# Patient Record
Sex: Male | Born: 1950 | Race: Black or African American | Hispanic: No | Marital: Married | Smoking: Former smoker
Health system: Southern US, Community
[De-identification: ages and names within clinical notes are randomized; demographics above are authoritative.]

## PROBLEM LIST (undated history)

## (undated) DIAGNOSIS — I10 Essential (primary) hypertension: Secondary | ICD-10-CM

## (undated) DIAGNOSIS — I2510 Atherosclerotic heart disease of native coronary artery without angina pectoris: Secondary | ICD-10-CM

## (undated) DIAGNOSIS — J449 Chronic obstructive pulmonary disease, unspecified: Secondary | ICD-10-CM

## (undated) DIAGNOSIS — M1990 Unspecified osteoarthritis, unspecified site: Secondary | ICD-10-CM

## (undated) DIAGNOSIS — I739 Peripheral vascular disease, unspecified: Secondary | ICD-10-CM

## (undated) DIAGNOSIS — F410 Panic disorder [episodic paroxysmal anxiety] without agoraphobia: Secondary | ICD-10-CM

## (undated) DIAGNOSIS — F419 Anxiety disorder, unspecified: Secondary | ICD-10-CM

## (undated) DIAGNOSIS — K219 Gastro-esophageal reflux disease without esophagitis: Secondary | ICD-10-CM

## (undated) HISTORY — PX: CATARACT EXTRACTION W/ INTRAOCULAR LENS IMPLANT: SHX1309

## (undated) HISTORY — DX: Atherosclerotic heart disease of native coronary artery without angina pectoris: I25.10

## (undated) HISTORY — PX: NO PAST SURGERIES: SHX2092

---

## 2007-09-27 DIAGNOSIS — R221 Localized swelling, mass and lump, neck: Secondary | ICD-10-CM | POA: Insufficient documentation

## 2007-10-07 ENCOUNTER — Ambulatory Visit: Payer: Self-pay | Admitting: Otolaryngology

## 2010-04-17 DIAGNOSIS — M25519 Pain in unspecified shoulder: Secondary | ICD-10-CM | POA: Insufficient documentation

## 2017-06-09 DIAGNOSIS — Z720 Tobacco use: Secondary | ICD-10-CM | POA: Insufficient documentation

## 2017-06-09 DIAGNOSIS — B070 Plantar wart: Secondary | ICD-10-CM | POA: Insufficient documentation

## 2017-06-23 ENCOUNTER — Ambulatory Visit (INDEPENDENT_AMBULATORY_CARE_PROVIDER_SITE_OTHER): Payer: Medicare Other | Admitting: Podiatry

## 2017-06-23 DIAGNOSIS — M7751 Other enthesopathy of right foot: Secondary | ICD-10-CM

## 2017-06-23 DIAGNOSIS — L84 Corns and callosities: Secondary | ICD-10-CM | POA: Diagnosis not present

## 2017-06-23 DIAGNOSIS — M779 Enthesopathy, unspecified: Secondary | ICD-10-CM

## 2017-06-25 NOTE — Progress Notes (Signed)
   Subjective: 67 year old male presenting today as a new patient with a chief complaint of a painful callus lesion noted to the right sub-fifth MPJ that appeared over 3 months ago. Walking and bearing weight increases the pain. He has not done anything for treatment. Patient is here for further evaluation and treatment.   No past medical history on file.   Objective:  Physical Exam General: Alert and oriented x3 in no acute distress  Dermatology: Hyperkeratotic lesion present on the right sub-fifth MPJ. Pain on palpation with a central nucleated core noted. Skin is warm, dry and supple bilateral lower extremities. Negative for open lesions or macerations.  Vascular: Palpable pedal pulses bilaterally. No edema or erythema noted. Capillary refill within normal limits.  Neurological: Epicritic and protective threshold grossly intact bilaterally.   Musculoskeletal Exam: Pain on palpation at the keratotic lesion noted and to the 5th MPJ of the right foot. Range of motion within normal limits bilateral. Muscle strength 5/5 in all groups bilateral.  Assessment: 1. 5th MPJ capsulitis right 2. Pre-ulcerative callus lesion noted to the sub-fifth MPJ right    Plan of Care:  1. Patient evaluated 2. Excisional debridement of keratoic lesion using a chisel blade was performed without incident.  3. Dressed area with light dressing. 4. Injection of 0.5 mLs Celestone Soluspan injected into the right 5th MPJ.  5. Recommended good shoe gear.  6. Patient is to return to the clinic PRN.   Works maintenance at a golf course.   Edrick Kins, DPM [facility]  Dr. Edrick Kins, [address]                                        [place], [place] [address]                Office [phone]  Fax [phone]

## 2018-01-19 DIAGNOSIS — J45909 Unspecified asthma, uncomplicated: Secondary | ICD-10-CM | POA: Insufficient documentation

## 2018-01-25 ENCOUNTER — Telehealth: Payer: Self-pay | Admitting: *Deleted

## 2018-01-25 DIAGNOSIS — Z122 Encounter for screening for malignant neoplasm of respiratory organs: Secondary | ICD-10-CM

## 2018-01-25 DIAGNOSIS — Z87891 Personal history of nicotine dependence: Secondary | ICD-10-CM

## 2018-01-25 NOTE — Telephone Encounter (Signed)
Received referral for initial lung cancer screening scan. Contacted patient and obtained smoking history,(current, 47 pack year) as well as answering questions related to screening process. Patient denies signs of lung cancer such as weight loss or hemoptysis. Patient denies comorbidity that would prevent curative treatment if lung cancer were found. Patient is scheduled for shared decision making visit and CT scan on 02/18/18 at 145pm.

## 2018-01-25 NOTE — Telephone Encounter (Signed)
Received referral for low dose lung cancer screening CT scan. Message left at phone number listed in EMR for patient to call me back to facilitate scheduling scan.  

## 2018-02-15 ENCOUNTER — Telehealth: Payer: Self-pay | Admitting: *Deleted

## 2018-02-15 NOTE — Telephone Encounter (Signed)
Called patient to inform them of their appt for LDCT screening on Thursday 02-18-18@1345 , message left for patient.

## 2018-02-18 ENCOUNTER — Inpatient Hospital Stay: Payer: Medicare Other | Attending: Nurse Practitioner | Admitting: Nurse Practitioner

## 2018-02-18 ENCOUNTER — Encounter: Payer: Self-pay | Admitting: Nurse Practitioner

## 2018-02-18 ENCOUNTER — Ambulatory Visit: Payer: Medicare Other | Attending: Nurse Practitioner

## 2018-02-22 ENCOUNTER — Telehealth: Payer: Self-pay | Admitting: *Deleted

## 2018-02-22 NOTE — Telephone Encounter (Signed)
Patient is agreeable to being scheduled for lung screening 03/02/18 at 130pm.

## 2018-03-01 ENCOUNTER — Telehealth: Payer: Self-pay | Admitting: *Deleted

## 2018-03-01 NOTE — Telephone Encounter (Signed)
Called pt to remind them of appt for ldct screening on 03-02-2018 at 1330, message left for patient.

## 2018-03-02 ENCOUNTER — Inpatient Hospital Stay: Payer: Medicare Other | Attending: Oncology | Admitting: Nurse Practitioner

## 2018-03-02 ENCOUNTER — Encounter (INDEPENDENT_AMBULATORY_CARE_PROVIDER_SITE_OTHER): Payer: Self-pay

## 2018-03-02 ENCOUNTER — Encounter: Payer: Self-pay | Admitting: *Deleted

## 2018-03-02 ENCOUNTER — Ambulatory Visit
Admission: RE | Admit: 2018-03-02 | Discharge: 2018-03-02 | Disposition: A | Discharge status: Home / Self Care | Payer: Medicare Other | Source: Ambulatory Visit | Attending: Nurse Practitioner | Admitting: Nurse Practitioner

## 2018-03-02 DIAGNOSIS — Z720 Tobacco use: Secondary | ICD-10-CM

## 2018-03-02 DIAGNOSIS — Z122 Encounter for screening for malignant neoplasm of respiratory organs: Secondary | ICD-10-CM | POA: Diagnosis present

## 2018-03-02 DIAGNOSIS — Z87891 Personal history of nicotine dependence: Secondary | ICD-10-CM | POA: Diagnosis present

## 2018-03-02 DIAGNOSIS — F17210 Nicotine dependence, cigarettes, uncomplicated: Secondary | ICD-10-CM

## 2018-03-02 NOTE — Progress Notes (Signed)
In accordance with CMS guidelines, patient has met eligibility criteria including age, absence of signs or symptoms of lung cancer.  Social History   Tobacco Use  . Smoking status: Current Every Day Smoker    Packs/day: 1.00    Years: 47.00    Pack years: 47.00    Types: Cigarettes  Substance Use Topics  . Alcohol use: Not on file  . Drug use: Not on file      A shared decision-making session was conducted prior to the performance of CT scan. This includes one or more decision aids, includes benefits and harms of screening, follow-up diagnostic testing, over-diagnosis, false positive rate, and total radiation exposure.   Counseling on the importance of adherence to annual lung cancer LDCT screening, impact of co-morbidities, and ability or willingness to undergo diagnosis and treatment is imperative for compliance of the program.   Counseling on the importance of continued smoking cessation for former smokers; the importance of smoking cessation for current smokers, and information about tobacco cessation interventions have been given to patient including [place] and 1800 quit [place] programs.   Written order for lung cancer screening with LDCT has been given to the patient and any and all questions have been answered to the best of my abilities.    Yearly follow up will be coordinated by Burgess Estelle, Thoracic Navigator.  Beckey Rutter, DNP, AGNP-C [place] at [facility] [phone] (work cell) [phone] (office) 03/02/18 2:35 PM

## 2018-10-22 DIAGNOSIS — M779 Enthesopathy, unspecified: Secondary | ICD-10-CM | POA: Insufficient documentation

## 2019-03-02 ENCOUNTER — Telehealth: Payer: Self-pay | Admitting: *Deleted

## 2019-03-02 DIAGNOSIS — Z87891 Personal history of nicotine dependence: Secondary | ICD-10-CM

## 2019-03-02 NOTE — Telephone Encounter (Signed)
Patient has been notified that annual lung cancer screening low dose CT scan is due currently or will be in near future. Confirmed that patient is within the age range of 55-77, and asymptomatic, (no signs or symptoms of lung cancer). Patient denies illness that would prevent curative treatment for lung cancer if found. Verified smoking history, (current, 48 pack year). The shared decision making visit was done 03/02/18. Patient is agreeable for CT scan being scheduled.

## 2019-03-09 ENCOUNTER — Other Ambulatory Visit: Payer: Self-pay

## 2019-03-09 ENCOUNTER — Ambulatory Visit
Admission: RE | Admit: 2019-03-09 | Discharge: 2019-03-09 | Disposition: A | Discharge status: Home / Self Care | Payer: Medicare Other | Source: Ambulatory Visit | Attending: Oncology | Admitting: Oncology

## 2019-03-09 DIAGNOSIS — Z87891 Personal history of nicotine dependence: Secondary | ICD-10-CM | POA: Insufficient documentation

## 2019-03-11 ENCOUNTER — Encounter: Payer: Self-pay | Admitting: *Deleted

## 2019-05-31 DIAGNOSIS — Z13220 Encounter for screening for lipoid disorders: Secondary | ICD-10-CM | POA: Insufficient documentation

## 2019-05-31 DIAGNOSIS — Z1211 Encounter for screening for malignant neoplasm of colon: Secondary | ICD-10-CM | POA: Insufficient documentation

## 2019-05-31 DIAGNOSIS — R1310 Dysphagia, unspecified: Secondary | ICD-10-CM | POA: Insufficient documentation

## 2020-03-01 ENCOUNTER — Other Ambulatory Visit: Payer: Self-pay | Admitting: *Deleted

## 2020-03-01 DIAGNOSIS — Z87891 Personal history of nicotine dependence: Secondary | ICD-10-CM

## 2020-03-01 DIAGNOSIS — Z122 Encounter for screening for malignant neoplasm of respiratory organs: Secondary | ICD-10-CM

## 2020-03-01 NOTE — Progress Notes (Signed)
Contacted and scheduled for annual lung screening scan. Patient is a current smoker with a 49 pack year history.  

## 2020-03-15 ENCOUNTER — Ambulatory Visit
Admission: RE | Admit: 2020-03-15 | Discharge: 2020-03-15 | Disposition: A | Discharge status: Home / Self Care | Payer: Medicare Other | Source: Ambulatory Visit | Attending: Nurse Practitioner | Admitting: Nurse Practitioner

## 2020-03-15 ENCOUNTER — Other Ambulatory Visit: Payer: Self-pay

## 2020-03-15 DIAGNOSIS — Z87891 Personal history of nicotine dependence: Secondary | ICD-10-CM | POA: Insufficient documentation

## 2020-03-15 DIAGNOSIS — I2510 Atherosclerotic heart disease of native coronary artery without angina pectoris: Secondary | ICD-10-CM | POA: Insufficient documentation

## 2020-03-15 DIAGNOSIS — Z122 Encounter for screening for malignant neoplasm of respiratory organs: Secondary | ICD-10-CM | POA: Insufficient documentation

## 2020-03-19 ENCOUNTER — Other Ambulatory Visit: Payer: Self-pay

## 2020-03-19 ENCOUNTER — Ambulatory Visit (INDEPENDENT_AMBULATORY_CARE_PROVIDER_SITE_OTHER): Payer: Medicare Other | Admitting: Cardiology

## 2020-03-19 ENCOUNTER — Encounter: Payer: Self-pay | Admitting: Cardiology

## 2020-03-19 ENCOUNTER — Encounter: Payer: Self-pay | Admitting: *Deleted

## 2020-03-19 VITALS — BP 140/70 | HR 94 | Ht 75.0 in | Wt 223.0 lb

## 2020-03-19 DIAGNOSIS — F17200 Nicotine dependence, unspecified, uncomplicated: Secondary | ICD-10-CM | POA: Diagnosis not present

## 2020-03-19 DIAGNOSIS — I2510 Atherosclerotic heart disease of native coronary artery without angina pectoris: Secondary | ICD-10-CM | POA: Diagnosis not present

## 2020-03-19 MED ORDER — ASPIRIN EC 81 MG PO TBEC: 81.0000 mg | DELAYED_RELEASE_TABLET | Freq: Every day | ORAL | 3 refills | Status: DC

## 2020-03-19 NOTE — Progress Notes (Signed)
Cardiology Office Note:    Date:  03/19/2020   ID:  Blake Padilla, DOB [birth year], MRN [id number]  PCP:  Marguerita Merles, MD  [facility] Cardiologist:  Kate Sable, MD  [place] Electrophysiologist:  None   Referring MD: Marguerita Merles, MD   Chief Complaint  Patient presents with  . NEW patient-Referred by Dr. MIles for CAD    History of Present Illness:    Blake Padilla is a 70 y.o. male with a hx of CAD, current smoker x50+ years, emphysema who presents due to coronary artery calcifications.  Patient gets chest CT lung cancer screening yearly due to smoking history.  Last chest CT noted to have three-vessel coronary artery calcifications.  He denies symptoms of chest pain or shortness of breath, he works in a golf course, which involves a lot of walking, and playing some golf.  Has no symptoms at rest or with exertion.  Denies taking any medicines, denies any family history of heart disease.  History reviewed. No pertinent past medical history.  History reviewed. No pertinent surgical history.  Current Medications: Current Meds  Medication Sig  . aspirin EC 81 MG tablet Take 1 tablet (81 mg total) by mouth daily. Swallow whole.     Allergies:   Patient has no known allergies.   Social History   Socioeconomic History  . Marital status: Married    Spouse name: Not on file  . Number of children: Not on file  . Years of education: Not on file  . Highest education level: Not on file  Occupational History  . Not on file  Tobacco Use  . Smoking status: Current Every Day Smoker    Packs/day: 1.00    Years: 47.00    Pack years: 47.00    Types: Cigarettes  . Smokeless tobacco: Never Used  Vaping Use  . Vaping Use: Never used  Substance and Sexual Activity  . Alcohol use: Not Currently    Comment: with colds  . Drug use: Never  . Sexual activity: Not on file  Other Topics Concern  . Not on file  Social History Narrative  . Not on file    Social Determinants of Health   Financial Resource Strain: Not on file  Food Insecurity: Not on file  Transportation Needs: Not on file  Physical Activity: Not on file  Stress: Not on file  Social Connections: Not on file     Family History: The patient's family history includes COPD in his sister; Dementia in his sister; Diabetes in his brother, father, and mother; Healthy in his sister; Hypertension in his father and mother; Leukemia in his sister.  ROS:   Please see the history of present illness.     All other systems reviewed and are negative.  EKGs/Labs/Other Studies Reviewed:    The following studies were reviewed today:   EKG:  EKG is  ordered today.  The ekg ordered today demonstrates sinus rhythm, PACs  Recent Labs: No results found for requested labs within last 8760 hours.  Recent Lipid Panel No results found for: CHOL, TRIG, HDL, CHOLHDL, VLDL, LDLCALC, LDLDIRECT   Risk Assessment/Calculations:      Physical Exam:    VS:  BP 140/70 (BP Location: Right Arm, Patient Position: Sitting, Cuff Size: Normal)   Pulse 94   Ht 6\' 3"  (1.905 m)   Wt 223 lb (101.2 kg)   SpO2 97%   BMI 27.87 kg/m     Wt Readings from  Last 3 Encounters:  03/19/20 223 lb (101.2 kg)  03/15/20 215 lb (97.5 kg)  03/09/19 226 lb (102.5 kg)     GEN:  Well nourished, well developed in no acute distress HEENT: Normal NECK: No JVD; No carotid bruits LYMPHATICS: No lymphadenopathy CARDIAC: RRR, no murmurs, rubs, gallops RESPIRATORY: Decreased breath sounds, otherwise clear  rhonchi  ABDOMEN: Soft, non-tender, non-distended MUSCULOSKELETAL:  No edema; No deformity  SKIN: Warm and dry NEUROLOGIC:  Alert and oriented x 3 PSYCHIATRIC:  Normal affect   ASSESSMENT:    1. Coronary artery disease involving native coronary artery of native heart without angina pectoris   2. Smoking    PLAN:    In order of problems listed above:  1. Three-vessel coronary artery calcifications.   Currently asymptomatic.  Start aspirin 81 mg daily, get fasting lipid profile.  Get echocardiogram.  LDL goal less than 70.  He will need some form of statin therapy will determine intensity level based on lipid panel results. 2. Patient is a current smoker, cessation advised.  Follow-up after echocardiogram.     Medication Adjustments/Labs and Tests Ordered: Current medicines are reviewed at length with the patient today.  Concerns regarding medicines are outlined above.  Orders Placed This Encounter  Procedures  . Lipid panel  . EKG 12-Lead  . ECHOCARDIOGRAM COMPLETE   Meds ordered this encounter  Medications  . aspirin EC 81 MG tablet    Sig: Take 1 tablet (81 mg total) by mouth daily. Swallow whole.    Dispense:  90 tablet    Refill:  3    Patient Instructions  Medication Instructions:   Your physician has recommended you make the following change in your medication:   1.  START taking Aspirin 81 MG: Take 1 tablet by mouth daily.  *If you need a refill on your cardiac medications before your next appointment, please call your pharmacy*   Lab Work:  Your physician recommends that you have a FASTING lipid profile drawn today.  If you have labs (blood work) drawn today and your tests are completely normal, you will receive your results only by: Marland Kitchen MyChart Message (if you have MyChart) OR . A paper copy in the mail If you have any lab test that is abnormal or we need to change your treatment, we will call you to review the results.   Testing/Procedures:  Your physician has requested that you have an echocardiogram. Echocardiography is a painless test that uses sound waves to create images of your heart. It provides your doctor with information about the size and shape of your heart and how well your heart's chambers and valves are working. This procedure takes approximately one hour. There are no restrictions for this procedure.    Follow-Up: At [facility], you  and your health needs are our priority.  As part of our continuing mission to provide you with exceptional heart care, we have created designated Provider Care Teams.  These Care Teams include your primary Cardiologist (physician) and Advanced Practice Providers (APPs -  Physician Assistants and Nurse Practitioners) who all work together to provide you with the care you need, when you need it.  We recommend signing up for the patient portal called "MyChart".  Sign up information is provided on this After Visit Summary.  MyChart is used to connect with patients for Virtual Visits (Telemedicine).  Patients are able to view lab/test results, encounter notes, upcoming appointments, etc.  Non-urgent messages can be sent to your provider as  well.   To learn more about what you can do with MyChart, go to [email or web address].    Your next appointment:   Follow up after Echo   The format for your next appointment:   In Person  Provider:   Kate Sable, MD   Other Instructions      Signed, Kate Sable, MD  03/19/2020 1:11 PM    [place]

## 2020-03-19 NOTE — Patient Instructions (Signed)
Medication Instructions:   Your physician has recommended you make the following change in your medication:   1.  START taking Aspirin 81 MG: Take 1 tablet by mouth daily.  *If you need a refill on your cardiac medications before your next appointment, please call your pharmacy*   Lab Work:  Your physician recommends that you have a FASTING lipid profile drawn today.  If you have labs (blood work) drawn today and your tests are completely normal, you will receive your results only by: Marland Kitchen MyChart Message (if you have MyChart) OR . A paper copy in the mail If you have any lab test that is abnormal or we need to change your treatment, we will call you to review the results.   Testing/Procedures:  Your physician has requested that you have an echocardiogram. Echocardiography is a painless test that uses sound waves to create images of your heart. It provides your doctor with information about the size and shape of your heart and how well your heart's chambers and valves are working. This procedure takes approximately one hour. There are no restrictions for this procedure.    Follow-Up: At [facility], you and your health needs are our priority.  As part of our continuing mission to provide you with exceptional heart care, we have created designated Provider Care Teams.  These Care Teams include your primary Cardiologist (physician) and Advanced Practice Providers (APPs -  Physician Assistants and Nurse Practitioners) who all work together to provide you with the care you need, when you need it.  We recommend signing up for the patient portal called "MyChart".  Sign up information is provided on this After Visit Summary.  MyChart is used to connect with patients for Virtual Visits (Telemedicine).  Patients are able to view lab/test results, encounter notes, upcoming appointments, etc.  Non-urgent messages can be sent to your provider as well.   To learn more about what you can do with  MyChart, go to [email or web address].    Your next appointment:   Follow up after Echo   The format for your next appointment:   In Person  Provider:   Kate Sable, MD   Other Instructions

## 2020-03-20 ENCOUNTER — Telehealth: Payer: Self-pay

## 2020-03-20 LAB — LIPID PANEL
Chol/HDL Ratio: 4.4 ratio (ref 0.0–5.0)
Cholesterol, Total: 186 mg/dL (ref 100–199)
HDL: 42 mg/dL (ref >39)
LDL Chol Calc (NIH): 127 mg/dL — ABNORMAL HIGH (ref 0–99)
Triglycerides: 90 mg/dL (ref 0–149)
VLDL Cholesterol Cal: 17 mg/dL (ref 5–40)

## 2020-03-20 MED ORDER — ATORVASTATIN CALCIUM 40 MG PO TABS: 40.0000 mg | ORAL_TABLET | Freq: Every day | ORAL | 5 refills | Status: DC

## 2020-03-20 NOTE — Telephone Encounter (Signed)
Spoke with patient and gave him the following recommendations:  Kate Sable, MD  Kavin Leech, RN LDL/bad cholesterol not at goal/elevated. Please start Lipitor 40 mg daily. Keep follow-up appointment with myself. Thank you   Patient verbalized understanding and agreed with plan. He also confirmed that he will be here for his echo and his follow up appointment.

## 2020-03-29 ENCOUNTER — Ambulatory Visit (INDEPENDENT_AMBULATORY_CARE_PROVIDER_SITE_OTHER): Payer: Medicare Other

## 2020-03-29 ENCOUNTER — Other Ambulatory Visit: Payer: Self-pay

## 2020-03-29 DIAGNOSIS — I2510 Atherosclerotic heart disease of native coronary artery without angina pectoris: Secondary | ICD-10-CM

## 2020-03-29 LAB — ECHOCARDIOGRAM COMPLETE
AR max vel: 3.06 cm2
AV Area VTI: 2.69 cm2
AV Area mean vel: 2.72 cm2
AV Mean grad: 5 mmHg
AV Peak grad: 8.4 mmHg
Ao pk vel: 1.45 m/s
Area-P 1/2: 3.61 cm2
S' Lateral: 3.5 cm
Single Plane A4C EF: 52.9 %

## 2020-04-02 ENCOUNTER — Other Ambulatory Visit: Payer: Self-pay

## 2020-04-02 ENCOUNTER — Ambulatory Visit (INDEPENDENT_AMBULATORY_CARE_PROVIDER_SITE_OTHER): Payer: Medicare Other | Admitting: Cardiology

## 2020-04-02 ENCOUNTER — Encounter: Payer: Self-pay | Admitting: Cardiology

## 2020-04-02 VITALS — BP 102/80 | HR 97 | Ht 75.0 in | Wt 223.0 lb

## 2020-04-02 DIAGNOSIS — I2510 Atherosclerotic heart disease of native coronary artery without angina pectoris: Secondary | ICD-10-CM | POA: Diagnosis not present

## 2020-04-02 DIAGNOSIS — E785 Hyperlipidemia, unspecified: Secondary | ICD-10-CM | POA: Diagnosis not present

## 2020-04-02 DIAGNOSIS — F17200 Nicotine dependence, unspecified, uncomplicated: Secondary | ICD-10-CM

## 2020-04-02 NOTE — Patient Instructions (Signed)
Medication Instructions:  Your physician recommends that you continue on your current medications as directed. Please refer to the Current Medication list given to you today.  *If you need a refill on your cardiac medications before your next appointment, please call your pharmacy*   Lab Work:  Your physician recommends that you return for a FASTING lipid profile: just prior to your follow up appointment.  - You will need to be fasting. Please do not have anything to eat or drink after midnight the morning you have the lab work. You may only have water or black coffee with no cream or sugar. - Please go to the [facility]. You will check in at the front desk to the right as you walk into the atrium. Valet Parking is offered if needed. - No appointment needed. You may go any day between 7 am and 6 pm.   Testing/Procedures: None ordered   Follow-Up: At [facility], you and your health needs are our priority.  As part of our continuing mission to provide you with exceptional heart care, we have created designated Provider Care Teams.  These Care Teams include your primary Cardiologist (physician) and Advanced Practice Providers (APPs -  Physician Assistants and Nurse Practitioners) who all work together to provide you with the care you need, when you need it.  We recommend signing up for the patient portal called "MyChart".  Sign up information is provided on this After Visit Summary.  MyChart is used to connect with patients for Virtual Visits (Telemedicine).  Patients are able to view lab/test results, encounter notes, upcoming appointments, etc.  Non-urgent messages can be sent to your provider as well.   To learn more about what you can do with MyChart, go to [email or web address].    Your next appointment:   6 month(s)  The format for your next appointment:   In Person  Provider:   Kate Sable, MD   Other Instructions

## 2020-04-02 NOTE — Progress Notes (Signed)
Cardiology Office Note:    Date:  04/02/2020   ID:  Blake Padilla, DOB 07/29/1950, MRN [id number]  PCP:  Marguerita Merles, MD  [facility] Cardiologist:  Kate Sable, MD  [place] Electrophysiologist:  None   Referring MD: Marguerita Merles, MD   Chief Complaint  Patient presents with  . Follow-up    Follow up post ECHO - Meds reviewed verbally with patient.     History of Present Illness:    Blake Padilla is a 70 y.o. male with a hx of CAD, current smoker x50+ years, emphysema who presents for follow-up.  He was last seen due to coronary artery calcifications noted on noncontrast chest CT for lung cancer screening.  He denies chest pain, shortness of breath.  He still smokes.  Echocardiogram was ordered to evaluate cardiac function, lipid panel also obtain for risk stratification.  He feels well, has no concerns at this time.   History reviewed. No pertinent past medical history.  History reviewed. No pertinent surgical history.  Current Medications: Current Meds  Medication Sig  . aspirin EC 81 MG tablet Take 1 tablet (81 mg total) by mouth daily. Swallow whole.  Marland Kitchen atorvastatin (LIPITOR) 40 MG tablet Take 1 tablet (40 mg total) by mouth daily.     Allergies:   Patient has no known allergies.   Social History   Socioeconomic History  . Marital status: Married    Spouse name: Not on file  . Number of children: Not on file  . Years of education: Not on file  . Highest education level: Not on file  Occupational History  . Not on file  Tobacco Use  . Smoking status: Current Every Day Smoker    Packs/day: 1.00    Years: 47.00    Pack years: 47.00    Types: Cigarettes  . Smokeless tobacco: Never Used  Vaping Use  . Vaping Use: Never used  Substance and Sexual Activity  . Alcohol use: Not Currently    Comment: with colds  . Drug use: Never  . Sexual activity: Not on file  Other Topics Concern  . Not on file  Social History Narrative  . Not  on file   Social Determinants of Health   Financial Resource Strain: Not on file  Food Insecurity: Not on file  Transportation Needs: Not on file  Physical Activity: Not on file  Stress: Not on file  Social Connections: Not on file     Family History: The patient's family history includes COPD in his sister; Dementia in his sister; Diabetes in his brother, father, and mother; Healthy in his sister; Hypertension in his father and mother; Leukemia in his sister.  ROS:   Please see the history of present illness.     All other systems reviewed and are negative.  EKGs/Labs/Other Studies Reviewed:    The following studies were reviewed today:   EKG:  EKG not  ordered today.   Recent Labs: No results found for requested labs within last 8760 hours.  Recent Lipid Panel    Component Value Date/Time   CHOL 186 03/19/2020 0841   TRIG 90 03/19/2020 0841   HDL 42 03/19/2020 0841   CHOLHDL 4.4 03/19/2020 0841   LDLCALC 127 (H) 03/19/2020 0841     Risk Assessment/Calculations:      Physical Exam:    VS:  BP 102/80 (BP Location: Left Arm, Patient Position: Sitting, Cuff Size: Normal)   Pulse 97   Ht 6'  3" (1.905 m)   Wt 223 lb (101.2 kg)   SpO2 94%   BMI 27.87 kg/m     Wt Readings from Last 3 Encounters:  04/02/20 223 lb (101.2 kg)  03/19/20 223 lb (101.2 kg)  03/15/20 215 lb (97.5 kg)     GEN:  Well nourished, well developed in no acute distress HEENT: Normal NECK: No JVD; No carotid bruits LYMPHATICS: No lymphadenopathy CARDIAC: RRR, no murmurs, rubs, gallops RESPIRATORY: Decreased breath sounds, otherwise clear  rhonchi  ABDOMEN: Soft, non-tender, non-distended MUSCULOSKELETAL:  No edema; No deformity  SKIN: Warm and dry NEUROLOGIC:  Alert and oriented x 3 PSYCHIATRIC:  Normal affect   ASSESSMENT:    1. Coronary artery disease involving native coronary artery of native heart without angina pectoris   2. Hyperlipidemia LDL goal <70   3. Smoking    PLAN:     In order of problems listed above:  1. Three-vessel coronary artery calcifications on chest CT, asymptomatic.  Continue aspirin 81 mg, start Lipitor 40 mg daily.  Goal LDL less than 70.  Echo 04/7104 normal systolic function, EF 60 to 65%. 2. Hyperlipidemia, LDL goal less than 70.  Lipitor 40 mg as above.  Get fasting lipid profile prior to follow-up visit 3. Patient is a current smoker, cessation advised.  Follow-up in 6 months.     Medication Adjustments/Labs and Tests Ordered: Current medicines are reviewed at length with the patient today.  Concerns regarding medicines are outlined above.  Orders Placed This Encounter  Procedures  . Lipid panel   No orders of the defined types were placed in this encounter.   Patient Instructions  Medication Instructions:  Your physician recommends that you continue on your current medications as directed. Please refer to the Current Medication list given to you today.  *If you need a refill on your cardiac medications before your next appointment, please call your pharmacy*   Lab Work:  Your physician recommends that you return for a FASTING lipid profile: just prior to your follow up appointment.  - You will need to be fasting. Please do not have anything to eat or drink after midnight the morning you have the lab work. You may only have water or black coffee with no cream or sugar. - Please go to the [facility]. You will check in at the front desk to the right as you walk into the atrium. Valet Parking is offered if needed. - No appointment needed. You may go any day between 7 am and 6 pm.   Testing/Procedures: None ordered   Follow-Up: At [facility], you and your health needs are our priority.  As part of our continuing mission to provide you with exceptional heart care, we have created designated Provider Care Teams.  These Care Teams include your primary Cardiologist (physician) and Advanced Practice Providers (APPs -   Physician Assistants and Nurse Practitioners) who all work together to provide you with the care you need, when you need it.  We recommend signing up for the patient portal called "MyChart".  Sign up information is provided on this After Visit Summary.  MyChart is used to connect with patients for Virtual Visits (Telemedicine).  Patients are able to view lab/test results, encounter notes, upcoming appointments, etc.  Non-urgent messages can be sent to your provider as well.   To learn more about what you can do with MyChart, go to [email or web address].    Your next appointment:   6 month(s)  The format  for your next appointment:   In Person  Provider:   Kate Sable, MD   Other Instructions       Signed, Kate Sable, MD  04/02/2020 12:59 PM    [place]

## 2020-04-16 ENCOUNTER — Telehealth: Payer: Self-pay | Admitting: Cardiology

## 2020-04-16 MED ORDER — PRAVASTATIN SODIUM 20 MG PO TABS: 20.0000 mg | ORAL_TABLET | Freq: Every evening | ORAL | 3 refills | Status: DC

## 2020-04-16 NOTE — Telephone Encounter (Signed)
Spoke to patient. States he started the atorvastatin about the first of February after seeing Dr. Garen Lah. Since then he's noticed his legs have been hurting. Says they were hurting so bad that he had to take some tylenol.  Advised him that is one of the side of effects. Advised we will let him know Dr. Thereasa Solo further recommendations.

## 2020-04-16 NOTE — Telephone Encounter (Signed)
Kate Sable, MD  You 16 minutes ago (12:26 PM)   Stop Lipitor due to muscle aches. Start Pravachol 20 mg daily. If patient develops symptoms on Pravachol, we will plan to stop this medication also and consider other medical options. Thank you   Routing comment    Patient notified and verbalized understanding to stop the atorvastatin and start the pravachol.  Rx sent to pharmacy.

## 2020-04-16 NOTE — Telephone Encounter (Signed)
Pt c/o medication issue:  1. Name of Medication: atorvastatin   2. How are you currently taking this medication (dosage and times per day)? 1 tablet daily   3. Are you having a reaction (difficulty breathing--STAT)? Noticing pain in legs   4. What is your medication issue? Patient calling - states he would like to know the side effects of this medication.  Having problems with legs and wants to know if it has to do with meds.  Please call  To discuss.

## 2020-08-21 ENCOUNTER — Telehealth: Payer: Self-pay | Admitting: Cardiology

## 2020-08-21 MED ORDER — PRAVASTATIN SODIUM 20 MG PO TABS: 20.0000 mg | ORAL_TABLET | Freq: Every evening | ORAL | 1 refills | Status: DC

## 2020-08-21 NOTE — Telephone Encounter (Signed)
Requested Prescriptions   Signed Prescriptions Disp Refills   pravastatin (PRAVACHOL) 20 MG tablet 30 tablet 1    Sig: Take 1 tablet (20 mg total) by mouth every evening. Please schedule office visit for further refills. Thank you!    Authorizing Provider: Kate Sable    Ordering User: Raelene Bott, Anner Baity L

## 2020-08-21 NOTE — Telephone Encounter (Signed)
*  STAT* If patient is at the pharmacy, call can be transferred to refill team.   1. Which medications need to be refilled? (please list name of each medication and dose if known) Pravachol 20mg  1Tablet daily  2. Which pharmacy/location (including street and city if local pharmacy) is medication to be sent to? [place]  3. Do they need a 30 day or 90 day supply? 30 day

## 2020-08-22 ENCOUNTER — Encounter: Payer: Self-pay | Admitting: Cardiology

## 2020-08-23 ENCOUNTER — Telehealth: Payer: Self-pay

## 2020-08-23 DIAGNOSIS — E785 Hyperlipidemia, unspecified: Secondary | ICD-10-CM

## 2020-08-23 NOTE — Telephone Encounter (Signed)
Left a detailed VM per DPR on file with the result note as seen below. Lipid panel order has been entered. Encouraged patient to call back with any questions or concerns.

## 2020-08-23 NOTE — Telephone Encounter (Signed)
-----   Message from Kate Sable, MD sent at 08/23/2020  4:06 PM EDT ----- Cholesterol numbers are improved although this was when patient was taking Lipitor.  Continue Pravachol as prescribed to help with controlling cholesterol.  Please repeat fasting lipid profile in about 3 months.  Thank you

## 2020-10-23 ENCOUNTER — Telehealth: Payer: Self-pay | Admitting: Cardiology

## 2020-10-23 MED ORDER — PRAVASTATIN SODIUM 20 MG PO TABS: 20.0000 mg | ORAL_TABLET | Freq: Every evening | ORAL | 1 refills | Status: DC

## 2020-10-23 NOTE — Telephone Encounter (Signed)
Refill sent for Pravastatin 20 mg

## 2020-10-23 NOTE — Telephone Encounter (Signed)
*  STAT* If patient is at the pharmacy, call can be transferred to refill team.   1. Which medications need to be refilled? (please list name of each medication and dose if known) pravastatin 20 mg po q d   2. Which pharmacy/location (including street and city if local pharmacy) is medication to be sent to? [facility]  3. Do they need a 30 day or 90 day supply? [name]

## 2020-10-26 ENCOUNTER — Other Ambulatory Visit: Payer: Self-pay

## 2020-10-26 ENCOUNTER — Encounter: Payer: Self-pay | Admitting: Cardiology

## 2020-10-26 ENCOUNTER — Ambulatory Visit (INDEPENDENT_AMBULATORY_CARE_PROVIDER_SITE_OTHER): Payer: Medicare Other | Admitting: Cardiology

## 2020-10-26 VITALS — BP 136/84 | HR 67 | Ht 75.0 in | Wt 215.0 lb

## 2020-10-26 DIAGNOSIS — E785 Hyperlipidemia, unspecified: Secondary | ICD-10-CM

## 2020-10-26 DIAGNOSIS — I2510 Atherosclerotic heart disease of native coronary artery without angina pectoris: Secondary | ICD-10-CM | POA: Diagnosis not present

## 2020-10-26 DIAGNOSIS — F17200 Nicotine dependence, unspecified, uncomplicated: Secondary | ICD-10-CM | POA: Diagnosis not present

## 2020-10-26 NOTE — Progress Notes (Signed)
Cardiology Office Note:    Date:  10/26/2020   ID:  Blake Padilla, DOB 1950/09/03, MRN [id number]  PCP:  Marguerita Merles, MD  [facility] Cardiologist:  Kate Sable, MD  [place] Electrophysiologist:  None   Referring MD: Marguerita Merles, MD   Chief Complaint  Patient presents with   Other    6 month follow up -- Patient c.o left leg pain (mainly at night) -- has a gel that PCP gave him that has helped. Meds reviewed verbally with patient.     History of Present Illness:    Blake Padilla is a 70 y.o. male with a hx of CAD (coronary calcifications on CT), current smoker x50+ years, emphysema who presents for follow-up.    Being seen for CAD and hyperlipidemia.  Lipitor was started to control LDL .  Cholesterol numbers improved but he developed muscle aches, Lipitor was stopped, Pravachol was started.  He seems to be tolerating Pravachol without any muscle aches issues.  Denies chest pain.  He still smokes.  States he is currently fasting.  Tolerating aspirin 81 mg daily.   Prior notes Echocardiogram 03/2020 EF 60 to 65%, impaired relaxation.  History reviewed. No pertinent past medical history.  History reviewed. No pertinent surgical history.  Current Medications: Current Meds  Medication Sig   aspirin EC 81 MG tablet Take 1 tablet (81 mg total) by mouth daily. Swallow whole.   pravastatin (PRAVACHOL) 20 MG tablet Take 1 tablet (20 mg total) by mouth every evening. Please schedule office visit for further refills. Thank you!     Allergies:   Patient has no known allergies.   Social History   Socioeconomic History   Marital status: Married    Spouse name: Not on file   Number of children: Not on file   Years of education: Not on file   Highest education level: Not on file  Occupational History   Not on file  Tobacco Use   Smoking status: Every Day    Packs/day: 1.00    Years: 47.00    Pack years: 47.00    Types: Cigarettes   Smokeless  tobacco: Never  Vaping Use   Vaping Use: Never used  Substance and Sexual Activity   Alcohol use: Not Currently    Comment: with colds   Drug use: Never   Sexual activity: Not on file  Other Topics Concern   Not on file  Social History Narrative   Not on file   Social Determinants of Health   Financial Resource Strain: Not on file  Food Insecurity: Not on file  Transportation Needs: Not on file  Physical Activity: Not on file  Stress: Not on file  Social Connections: Not on file     Family History: The patient's family history includes COPD in his sister; Dementia in his sister; Diabetes in his brother, father, and mother; Healthy in his sister; Hypertension in his father and mother; Leukemia in his sister.  ROS:   Please see the history of present illness.     All other systems reviewed and are negative.  EKGs/Labs/Other Studies Reviewed:    The following studies were reviewed today:   EKG:  EKG is  ordered today.  Normal sinus rhythm,  Recent Labs: No results found for requested labs within last 8760 hours.  Recent Lipid Panel    Component Value Date/Time   CHOL 186 03/19/2020 0841   TRIG 90 03/19/2020 0841   HDL 42 03/19/2020  0841   CHOLHDL 4.4 03/19/2020 0841   LDLCALC 127 (H) 03/19/2020 0841     Risk Assessment/Calculations:      Physical Exam:    VS:  BP 136/84 (BP Location: Left Arm, Patient Position: Sitting, Cuff Size: Normal)   Pulse 67   Ht '6\' 3"'$  (1.905 m)   Wt 215 lb (97.5 kg)   BMI 26.87 kg/m     Wt Readings from Last 3 Encounters:  10/26/20 215 lb (97.5 kg)  04/02/20 223 lb (101.2 kg)  03/19/20 223 lb (101.2 kg)     GEN:  Well nourished, well developed in no acute distress HEENT: Normal NECK: No JVD; No carotid bruits LYMPHATICS: No lymphadenopathy CARDIAC: RRR, no murmurs, rubs, gallops RESPIRATORY: Decreased breath sounds, otherwise clear  rhonchi  ABDOMEN: Soft, non-tender, non-distended MUSCULOSKELETAL:  No edema; No  deformity  SKIN: Warm and dry NEUROLOGIC:  Alert and oriented x 3 PSYCHIATRIC:  Normal affect   ASSESSMENT:    1. Coronary artery disease involving native coronary artery of native heart without angina pectoris   2. Hyperlipidemia LDL goal <70   3. Smoking     PLAN:    In order of problems listed above:  Three-vessel coronary artery calcifications on chest CT, asymptomatic.  Continue aspirin 81 mg, continue Pravachol 20 mg daily.  Patient not tolerant to Lipitor due to muscle aches..  Get fasting lipid profile today.  Echo 03/2020 showed normal EF.   Hyperlipidemia, LDL goal less than 70.  Continue Pravachol 20 mg daily.  Fasting lipid profile today Patient is a current smoker, cessation advised.  Follow-up in 6 months.     Medication Adjustments/Labs and Tests Ordered: Current medicines are reviewed at length with the patient today.  Concerns regarding medicines are outlined above.  Orders Placed This Encounter  Procedures   Lipid panel   EKG 12-Lead    No orders of the defined types were placed in this encounter.   Patient Instructions  Medication Instructions:   Your physician recommends that you continue on your current medications as directed. Please refer to the Current Medication list given to you today.   *If you need a refill on your cardiac medications before your next appointment, please call your pharmacy*   Lab Work:  Lipid profile drawn today.    Testing/Procedures: None ordered   Follow-Up: At [facility], you and your health needs are our priority.  As part of our continuing mission to provide you with exceptional heart care, we have created designated Provider Care Teams.  These Care Teams include your primary Cardiologist (physician) and Advanced Practice Providers (APPs -  Physician Assistants and Nurse Practitioners) who all work together to provide you with the care you need, when you need it.  We recommend signing up for the patient  portal called "MyChart".  Sign up information is provided on this After Visit Summary.  MyChart is used to connect with patients for Virtual Visits (Telemedicine).  Patients are able to view lab/test results, encounter notes, upcoming appointments, etc.  Non-urgent messages can be sent to your provider as well.   To learn more about what you can do with MyChart, go to [email or web address].    Your next appointment:   6 month(s)  The format for your next appointment:   In Person  Provider:   You may see Kate Sable, MD or one of the following Advanced Practice Providers on your designated Care Team:   Murray Hodgkins, NP Christell Faith, PA-C Marrianne Mood,  PA-C Cadence Kathlen Mody, PA-C   Other Instructions    Signed, Kate Sable, MD  10/26/2020 4:52 PM    [facility]

## 2020-10-26 NOTE — Patient Instructions (Signed)
Medication Instructions:   Your physician recommends that you continue on your current medications as directed. Please refer to the Current Medication list given to you today.   *If you need a refill on your cardiac medications before your next appointment, please call your pharmacy*   Lab Work:  Lipid profile drawn today.    Testing/Procedures: None ordered   Follow-Up: At [facility], you and your health needs are our priority.  As part of our continuing mission to provide you with exceptional heart care, we have created designated Provider Care Teams.  These Care Teams include your primary Cardiologist (physician) and Advanced Practice Providers (APPs -  Physician Assistants and Nurse Practitioners) who all work together to provide you with the care you need, when you need it.  We recommend signing up for the patient portal called "MyChart".  Sign up information is provided on this After Visit Summary.  MyChart is used to connect with patients for Virtual Visits (Telemedicine).  Patients are able to view lab/test results, encounter notes, upcoming appointments, etc.  Non-urgent messages can be sent to your provider as well.   To learn more about what you can do with MyChart, go to [email or web address].    Your next appointment:   6 month(s)  The format for your next appointment:   In Person  Provider:   You may see Kate Sable, MD or one of the following Advanced Practice Providers on your designated Care Team:   Murray Hodgkins, NP Christell Faith, PA-C Marrianne Mood, PA-C Cadence Kathlen Mody, [place]   Other Instructions

## 2020-10-27 LAB — LIPID PANEL
Chol/HDL Ratio: 4 ratio (ref 0.0–5.0)
Cholesterol, Total: 155 mg/dL (ref 100–199)
HDL: 39 mg/dL — ABNORMAL LOW (ref >39)
LDL Chol Calc (NIH): 104 mg/dL — ABNORMAL HIGH (ref 0–99)
Triglycerides: 61 mg/dL (ref 0–149)
VLDL Cholesterol Cal: 12 mg/dL (ref 5–40)

## 2020-11-05 ENCOUNTER — Telehealth: Payer: Self-pay

## 2020-11-05 MED ORDER — EZETIMIBE 10 MG PO TABS: 10.0000 mg | ORAL_TABLET | Freq: Every day | ORAL | 3 refills | Status: DC

## 2020-11-05 NOTE — Telephone Encounter (Signed)
Called patient and informed him of the recommendations as charted in previous note. Patient agreeable to start Zetia. Sent prescription to [facility] as requested.

## 2020-11-05 NOTE — Telephone Encounter (Signed)
-----   Message from Kate Sable, MD sent at 10/31/2020  6:25 PM EDT ----- Continue Pravachol as prescribed with starting Zetia.

## 2020-11-27 DIAGNOSIS — K219 Gastro-esophageal reflux disease without esophagitis: Secondary | ICD-10-CM | POA: Insufficient documentation

## 2020-11-30 ENCOUNTER — Other Ambulatory Visit (HOSPITAL_BASED_OUTPATIENT_CLINIC_OR_DEPARTMENT_OTHER): Payer: Self-pay | Admitting: Family Medicine

## 2020-11-30 ENCOUNTER — Other Ambulatory Visit: Payer: Self-pay | Admitting: Family Medicine

## 2020-11-30 DIAGNOSIS — Z87891 Personal history of nicotine dependence: Secondary | ICD-10-CM

## 2020-11-30 DIAGNOSIS — Z720 Tobacco use: Secondary | ICD-10-CM

## 2020-12-13 ENCOUNTER — Ambulatory Visit
Admission: RE | Admit: 2020-12-13 | Discharge: 2020-12-13 | Disposition: A | Discharge status: Home / Self Care | Payer: Medicare HMO | Source: Ambulatory Visit | Attending: Family Medicine | Admitting: Family Medicine

## 2020-12-13 DIAGNOSIS — Z136 Encounter for screening for cardiovascular disorders: Secondary | ICD-10-CM | POA: Diagnosis not present

## 2020-12-13 DIAGNOSIS — Z720 Tobacco use: Secondary | ICD-10-CM

## 2020-12-13 DIAGNOSIS — F17210 Nicotine dependence, cigarettes, uncomplicated: Secondary | ICD-10-CM | POA: Insufficient documentation

## 2020-12-13 DIAGNOSIS — I723 Aneurysm of iliac artery: Secondary | ICD-10-CM | POA: Insufficient documentation

## 2020-12-18 ENCOUNTER — Other Ambulatory Visit: Payer: Self-pay

## 2020-12-18 ENCOUNTER — Ambulatory Visit (INDEPENDENT_AMBULATORY_CARE_PROVIDER_SITE_OTHER): Payer: Medicare HMO | Admitting: Vascular Surgery

## 2020-12-18 ENCOUNTER — Encounter (INDEPENDENT_AMBULATORY_CARE_PROVIDER_SITE_OTHER): Payer: Self-pay | Admitting: Vascular Surgery

## 2020-12-18 VITALS — BP 167/71 | HR 87 | Resp 16 | Ht 75.0 in | Wt 215.8 lb

## 2020-12-18 DIAGNOSIS — I723 Aneurysm of iliac artery: Secondary | ICD-10-CM

## 2020-12-18 DIAGNOSIS — E785 Hyperlipidemia, unspecified: Secondary | ICD-10-CM | POA: Diagnosis not present

## 2020-12-18 DIAGNOSIS — I713 Abdominal aortic aneurysm, ruptured, unspecified: Secondary | ICD-10-CM | POA: Diagnosis not present

## 2020-12-18 DIAGNOSIS — Z87891 Personal history of nicotine dependence: Secondary | ICD-10-CM

## 2020-12-18 NOTE — Assessment & Plan Note (Signed)
Is a risk factor for aneurysm development as well as atherosclerotic occlusive disease.

## 2020-12-18 NOTE — Assessment & Plan Note (Signed)
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  This will clearly need to be repaired.  We will get a CT angiogram ASAP to try to delineate the best way to fix this.  I am not sure if she will be a candidate for a branched endograft or if we will need to do hypogastric artery coil embolization.  We also will likely have to repair this up into the aorta although I will not know for sure until we see his CT scan.  Once I review his CT angiogram I will know the best way to fix this, but will clearly need to be repaired.  We will go ahead and start the preapproval process and get this on the schedule.

## 2020-12-18 NOTE — Progress Notes (Signed)
Patient ID: Blake Padilla, male   DOB: [birth year], 70 y.o.   MRN: [id number]  Chief Complaint  Patient presents with   New Patient (Initial Visit)    Ref Lennox Grumbles left iliac artery aneurysm 4.5cm    HPI Blake Padilla is a 71 y.o. male.  I am asked to see the patient by Dr.  Lennox Grumbles for evaluation of a large left iliac artery aneurysm that was found on an ultrasound within the past week.  The patient has been having severe left hip and leg pain.  This really limits him from golfing which he does regularly and is quite good at.  He does not have any signs of peripheral embolization.  He does not really have abdominal or back pain.  I have reviewed his ultrasound and he does clearly have a fairly large left iliac artery aneurysm in the 4.5 cm range with what appeared to be a fairly normal aorta.     Past Medical History:  Diagnosis Date   Coronary artery disease     Past Surgical History:  Procedure Laterality Date   NO PAST SURGERIES       Family History  Problem Relation Age of Onset   Diabetes Mother    Hypertension Mother    Diabetes Father    Hypertension Father    Leukemia Sister    Diabetes Brother    Dementia Sister    COPD Sister    Healthy Sister      Social History   Tobacco Use   Smoking status: Every Day    Packs/day: 1.00    Years: 47.00    Pack years: 47.00    Types: Cigarettes   Smokeless tobacco: Never  Vaping Use   Vaping Use: Never used  Substance Use Topics   Alcohol use: Not Currently    Comment: with colds   Drug use: Never    No Known Allergies  Current Outpatient Medications  Medication Sig Dispense Refill   aspirin EC 81 MG tablet Take 1 tablet (81 mg total) by mouth daily. Swallow whole. 90 tablet 3   ezetimibe (ZETIA) 10 MG tablet Take 1 tablet (10 mg total) by mouth daily. 90 tablet 3   pravastatin (PRAVACHOL) 20 MG tablet Take 1 tablet (20 mg total) by mouth every evening. Please schedule office visit for further refills.  Thank you! 30 tablet 1   No current facility-administered medications for this visit.      REVIEW OF SYSTEMS (Negative unless checked)  Constitutional: [] Weight loss  [] Fever  [] Chills Cardiac: [] Chest pain   [] Chest pressure   [] Palpitations   [] Shortness of breath when laying flat   [] Shortness of breath at rest   [] Shortness of breath with exertion. Vascular:  [x] Pain in legs with walking   [] Pain in legs at rest   [] Pain in legs when laying flat   [] Claudication   [] Pain in feet when walking  [] Pain in feet at rest  [] Pain in feet when laying flat   [] History of DVT   [] Phlebitis   [] Swelling in legs   [] Varicose veins   [] Non-healing ulcers Pulmonary:   [] Uses home oxygen   [] Productive cough   [] Hemoptysis   [] Wheeze  [] COPD   [] Asthma Neurologic:  [] Dizziness  [] Blackouts   [] Seizures   [] History of stroke   [] History of TIA  [] Aphasia   [] Temporary blindness   [] Dysphagia   [] Weakness or numbness in arms   [] Weakness or numbness in legs Musculoskeletal:  [  x]Arthritis   [] Joint swelling   [] Joint pain   [] Low back pain Hematologic:  [] Easy bruising  [] Easy bleeding   [] Hypercoagulable state   [] Anemic  [] Hepatitis Gastrointestinal:  [] Blood in stool   [] Vomiting blood  [] Gastroesophageal reflux/heartburn   [] Abdominal pain Genitourinary:  [] Chronic kidney disease   [] Difficult urination  [] Frequent urination  [] Burning with urination   [] Hematuria Skin:  [] Rashes   [] Ulcers   [] Wounds Psychological:  [] History of anxiety   []  History of major depression.    Physical Exam BP (!) 167/71 (BP Location: Left Arm)   Pulse 87   Resp 16   Ht 6\' 3"  (1.905 m)   Wt 215 lb 12.8 oz (97.9 kg)   BMI 26.97 kg/m  Gen:  WD/WN, NAD.  Appears younger than stated age Head: [place]/AT, No temporalis wasting.  Ear/Nose/Throat: Hearing grossly intact, nares w/o erythema or drainage, oropharynx w/o Erythema/Exudate Eyes: Conjunctiva clear, sclera non-icteric  Neck: trachea midline.  No JVD.  Pulmonary:   Good air movement, respirations not labored, no use of accessory muscles  Cardiac: RRR, no JVD Vascular:  Vessel Right Left  Radial Palpable Palpable                                   Gastrointestinal:. No masses, surgical incisions, or scars.  Increased aortic impulse difficult to palpate. Musculoskeletal: M/S 5/5 throughout.  Extremities without ischemic changes.  No deformity or atrophy.  No significant edema. Neurologic: Sensation grossly intact in extremities.  Symmetrical.  Speech is fluent. Motor exam as listed above. Psychiatric: Judgment intact, Mood & affect appropriate for pt's clinical situation. Dermatologic: No rashes or ulcers noted.  No cellulitis or open wounds.    Radiology US AORTA MEDICARE SCREENING  Addendum Date: 12/13/2020   ADDENDUM REPORT: 12/13/2020 10:15 ADDENDUM: In addition to vascular surgery referral, in the meantime CT angiography is suggested for better evaluation. These results will be called to the ordering clinician or representative by the Radiologist Assistant, and communication documented in the PACS or [employer]. Electronically Signed   By: Zetta Bills M.D.   On: 12/13/2020 10:15   Result Date: 12/13/2020 CLINICAL DATA:  Tobacco use in a 71 year old male for AAA screening. EXAM: US ABDOMINAL AORTA MEDICARE SCREENING TECHNIQUE: Ultrasound examination of the abdominal aorta was performed as a screening evaluation for abdominal aortic aneurysm. COMPARISON:  None FINDINGS: Abdominal aortic measurements as follows: Proximal:  2.8 x 2.7 cm Mid:  2.4 x 2.4 cm Distal:  2.8 x 2.9 cm LEFT common iliac with dilation beginning just beyond the iliac bifurcation with fusiform dilation of this vessel up to 4.5 x 4.1 cm. Internal soft plaque/mural thrombus is noted. IMPRESSION: Marked aneurysmal dilation of the LEFT iliac artery in the common iliac just beyond the internal-external bifurcation up to 4.5 cm greatest axial dimension. Prompt referral to  vascular surgery is suggested. Size places the patient at risk for rupture. Mild aneurysmal caliber of the distal abdominal aorta. Electronically Signed: By: Zetta Bills M.D. On: 12/13/2020 10:04    Labs Recent Results (from the past 2160 hour(s))  Lipid panel     Status: Abnormal   Collection Time: 10/26/20  9:15 AM  Result Value Ref Range   Cholesterol, Total 155 100 - 199 mg/dL   Triglycerides 61 0 - 149 mg/dL   HDL 39 (L) >39 mg/dL   VLDL Cholesterol Cal 12 5 - 40 mg/dL  LDL Chol Calc (NIH) 104 (H) 0 - 99 mg/dL   Chol/HDL Ratio 4.0 0.0 - 5.0 ratio    Comment:                                   T. 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  This will clearly need to be repaired.  We will get a CT angiogram ASAP to try to delineate the best way to fix this.  I am not sure if she will be a candidate for a branched endograft or if we will need to do hypogastric artery coil embolization.  We also will likely have to repair this up into the aorta although I will not know for sure until we see his CT scan.  Once I review his CT angiogram I will know the best way to fix this, but will clearly need to be repaired.  We will go ahead and start the preapproval process and get this on the schedule.  Personal history of tobacco use, presenting hazards to health Is a risk factor for aneurysm development as well as atherosclerotic occlusive disease.  Hyperlipidemia lipid control important in reducing the progression of atherosclerotic disease. Continue statin therapy      [name] 12/18/2020, 11:13  AM   This note was created with Dragon medical transcription system.  Any errors from dictation are unintentional.

## 2020-12-18 NOTE — Patient Instructions (Signed)
Abdominal Aortic Aneurysm Endograft Repair, Care After This sheet gives you information about how to care for yourself after your procedure. Your health care provider may also give you more specific instructions. If you have problems or questions, contact your health care provider. What can I expect after the procedure? After the procedure, it is common to have: Pain or soreness at the incision site. Tiredness (fatigue). Follow these instructions at home: Medicines Take over-the-counter and prescription medicines only as told by your health care provider. If you were prescribed an antibiotic medicine, take it as told by your health care provider. Do not stop using the antibiotic even if you start to feel better. If you are taking blood thinners: Talk with your health care provider before you take any medicines that contain aspirin or NSAIDs, such as ibuprofen. These medicines increase your risk for dangerous bleeding. Take your medicine exactly as told, at the same time every day. Avoid activities that could cause injury or bruising, and follow instructions about how to prevent falls. Wear a medical alert bracelet or carry a card that lists what medicines you take. Incision care  Follow instructions from your health care provider about how to take care of your incisions. Make sure you: Wash your hands with soap and water for at least 20 seconds before and after you change your bandage (dressing). If soap and water are not available, use hand sanitizer. Change your dressing as told by your health care provider. Leave stitches (sutures), skin glue, or adhesive strips in place. These skin closures may need to stay in place for 2 weeks or longer. If adhesive strip edges start to loosen and curl up, you may trim the loose edges. Do not remove adhesive strips completely unless your health care provider tells you to do that. Check your incision area every day for signs of infection. Check  for: Redness, swelling, or more pain. Fluid or blood. Warmth. Pus or a bad smell. Keep the incision area clean and dry. Do not take baths, swim, or use a hot tub until your health care provider approves. Ask your health care provider if you may take showers. You may only be allowed to take sponge baths. Activity  Rest as told by your health care provider. Avoid sitting for a long time without moving. Get up to take short walks every 1-2 hours. This is important to improve blood flow and breathing. Ask for help if you feel weak or unsteady. Do not lift anything that is heavier than 10 lb (4.5 kg), or the limit that you are told, until your health care provider says that it is safe. Do not drive until your health care provider says it is okay. Limit your other activities as told. Return to your normal activities as told by your health care provider. Ask your health care provider what activities are safe for you. Lifestyle  Do not use any products that contain nicotine or tobacco, such as cigarettes, e-cigarettes, and chewing tobacco. These can delay incision healing after surgery. If you need help quitting, ask your health care provider. Make any other lifestyle changes that your health care provider suggests. These may include: Keeping your blood pressure under control. Finding ways to lower stress. Eating healthy foods that are good for your heart, such as vegetables, fruits, and whole grains that add fiber to your diet. Getting regular exercise once your health care provider tells you it is safe to do so. General instructions Drink enough fluid to keep your urine  pale yellow. Before any dental work, tell your dentist about the graft in your aorta. You may need to take antibiotics to prevent your graft from getting infected. Keep all follow-up visits as told by your health care provider. This is important. Contact a health care provider if: You have pain in your abdomen, chest, or  back. You have redness or swelling around an incision. You have more pain around an incision. You have fluid or blood coming from an incision. Your incision feels warm to the touch. You have pus or a bad smell coming from an incision. You have a fever. Get help right away if: You have trouble breathing. You suddenly have pain in your legs, or you have trouble moving either of your legs. You faint, or you feel very light-headed. These symptoms may represent a serious problem that is an emergency. Do not wait to see if the symptoms will go away. Get medical help right away. Call your local emergency services (911 in the [place] Do not drive yourself to the hospital. Summary After abdominal aortic aneurysm endograft repair, it is common to feel tired or have pain or soreness at the incision site. Take all medicines as told by your health care provider. If you were given blood thinners, take them exactly as told by your health care provider. Wash your hands before and after caring for your incision. Leave sutures, skin glue, or adhesive strips in place for 2 weeks or longer. Rest as told. Avoid sitting for a long time without moving. Get up to take short walks every 1-2 hours. Let your health care provider know if you have pain in your abdomen, chest, or back. Get help right away if you have trouble breathing or have sudden pain in your legs. This information is not intended to replace advice given to you by your health care provider. Make sure you discuss any questions you have with your health care provider. Document Revised: 01/27/2019 Document Reviewed: 01/27/2019 Elsevier Patient Education  [place].

## 2020-12-18 NOTE — Assessment & Plan Note (Signed)
lipid control important in reducing the progression of atherosclerotic disease. Continue statin therapy  

## 2020-12-20 NOTE — Progress Notes (Signed)
Cardiology Office Note:    Date:  12/21/2020   ID:  KASSIDY FRANKSON, DOB Jul 16, 1950, MRN [id number]  PCP:  Marguerita Merles, MD  [facility] Cardiologist:  Kate Sable, MD  [facility] Electrophysiologist:  None   Referring MD: Algernon Huxley, MD   Chief Complaint:  cardiac clearance for aneurysm repair  History of Present Illness:    ALISHA BURGO is a 70 y.o. male with a hx of CAD on CT, HLD, tobacco use, emphysema who presents for follow-up.    H/o HLD on pravachol. Was intolerant to lipitor.   Echo 03/2020 showed EF 60-65% with impaired relaxation.   Last seen 10/26/20 and no changes were made  Today, the patient is hear for pre-op cardiac  eval for iliac artery aneurysm repair, measuring 4.5cm, followed by Dr. Lucky Cowboy. This is not scheduled yet, still needs CT as well before can be scheduled. Plan to go intravascularly. Patient smokes, 1/2 ppd. Patient is trying to quit. No alcohol or drug use. No h/o of ACS or stenting. He takes ASA daily. Patient is very active. He plays golf. Works at [employer] course. He is on his feet all day, walking around. Has some left leg pain, limiting some function. Some claudication symptoms. No chest pain or shortness of breath, Lle, orthopnea, pnd, palpitations.   Past Medical History:  Diagnosis Date   Coronary artery disease     Past Surgical History:  Procedure Laterality Date   NO PAST SURGERIES      Current Medications: Current Meds  Medication Sig   aspirin EC 81 MG tablet Take 1 tablet (81 mg total) by mouth daily. Swallow whole.   ezetimibe (ZETIA) 10 MG tablet Take 1 tablet (10 mg total) by mouth daily.   pravastatin (PRAVACHOL) 20 MG tablet Take 1 tablet (20 mg total) by mouth every evening. Please schedule office visit for further refills. Thank you!     Allergies:   Patient has no known allergies.   Social History   Socioeconomic History   Marital status: Married    Spouse name: Not on file   Number of children:  Not on file   Years of education: Not on file   Highest education level: Not on file  Occupational History   Not on file  Tobacco Use   Smoking status: Every Day    Packs/day: 1.00    Years: 47.00    Pack years: 47.00    Types: Cigarettes   Smokeless tobacco: Never  Vaping Use   Vaping Use: Never used  Substance and Sexual Activity   Alcohol use: Not Currently    Comment: with colds   Drug use: Never   Sexual activity: Not on file  Other Topics Concern   Not on file  Social History Narrative   Not on file   Social Determinants of Health   Financial Resource Strain: Not on file  Food Insecurity: Not on file  Transportation Needs: Not on file  Physical Activity: Not on file  Stress: Not on file  Social Connections: Not on file     Family History: The patient's family history includes COPD in his sister; Dementia in his sister; Diabetes in his brother, father, and mother; Healthy in his sister; Hypertension in his father and mother; Leukemia in his sister.  ROS:   Please see the history of present illness.     All other systems reviewed and are negative.  EKGs/Labs/Other Studies Reviewed:    The following  studies were reviewed today:  Echo 03/2020  1. Left ventricular ejection fraction, by estimation, is 60 to 65%. The  left ventricle has normal function. The left ventricle has no regional  wall motion abnormalities. There is mild to moderate left ventricular  hypertrophy. Left ventricular diastolic  parameters are consistent with Grade I diastolic dysfunction (impaired  relaxation). The average left ventricular global longitudinal strain is  -12.8 %. The global longitudinal strain is abnormal.   2. Right ventricular systolic function is normal. The right ventricular  size is normal.   3. Left atrial size was mildly dilated.   4. The mitral valve is normal in structure. Mild mitral valve  regurgitation.   EKG:  EKG is  ordered today. The ekg ordered today  demonstrates NSR, HR 81bpm, LVH, nonspecific T wave changes.   Recent Labs: No results found for requested labs within last 8760 hours.  Recent Lipid Panel    Component Value Date/Time   CHOL 155 10/26/2020 0915   TRIG 61 10/26/2020 0915   HDL 39 (L) 10/26/2020 0915   CHOLHDL 4.0 10/26/2020 0915   LDLCALC 104 (H) 10/26/2020 0915     Risk Assessment/Calculations:       Physical Exam:    VS:  BP 140/78 (BP Location: Left Arm, Patient Position: Sitting, Cuff Size: Normal)   Pulse 81   Ht 6\' 3"  (1.905 m)   Wt 216 lb 6 oz (98.1 kg)   SpO2 99%   BMI 27.05 kg/m     Wt Readings from Last 3 Encounters:  12/21/20 216 lb 6 oz (98.1 kg)  12/18/20 215 lb 12.8 oz (97.9 kg)  10/26/20 215 lb (97.5 kg)     GEN:  Well nourished, well developed in no acute distress HEENT: Normal NECK: No JVD; No carotid bruits LYMPHATICS: No lymphadenopathy CARDIAC: RRR, no murmurs, rubs, gallops RESPIRATORY:  Clear to auscultation without rales, wheezing or rhonchi  ABDOMEN: Soft, non-tender, non-distended MUSCULOSKELETAL:  No edema; No deformity  SKIN: Warm and dry NEUROLOGIC:  Alert and oriented x 3 PSYCHIATRIC:  Normal affect   ASSESSMENT:    1. Pre-operative cardiovascular examination   2. Coronary artery disease involving native coronary artery of native heart without angina pectoris   3. Hyperlipidemia LDL goal <70   4. Essential hypertension   5. Smoking   6. Elevated BP without diagnosis of hypertension   7. Iliac artery aneurysm (HCC)    PLAN:    In order of problems listed above:  CAD by CT Patient denies anginal symptoms. He is very active at baseline. Continue ASA and statin. Echo 03/2020 showed normal EF and no WMA. NO further work-up at this time.   HLD LDL 104 10/2020. Continue Pravachol and Zetia  Tobacco use Has decreased to 1/2 ppd. Complete cessation encouraged.   Elevated BP BP today 140/78. EKG with LVH, echo with mild to mod hypertrophy. Has not been on  antihypertensives in the past. Suspect he may need medication to control bp. Will check bp at home and can reassess at follow-up.   Cardiac evaluation for repair of Iliac artery aneurysm  Plan for intravascular Iliac artery aneurysm repair measuring 4.5cm, no date set for surgery. Following with Dr. Lucky Cowboy. Patient reports he is active at baseline, works at the golf course, and walking during the day. He has no anginal symptoms. He is still smoking, but trying to quit. Echo 2022 showed LVEF 60-65%, G1DD.  He denies LLE, orthopnea, pnd. BP elevated, but this can be  addressed at follow-up. EKG shows SR with no ischemic changes. METS>4. According to revised cardiac index score he is Class 2 Risk with 6% 30 day risk of death, MI or cardiac arrest. OK to proceed with surgery with no further cardiac testing.   Disposition: Follow up in 3 month(s) with MD/APP  Signed, Naimah Yingst Ninfa Meeker, PA-C  12/21/2020 10:02 AM    [facility]

## 2020-12-21 ENCOUNTER — Encounter: Payer: Self-pay | Admitting: Medical

## 2020-12-21 ENCOUNTER — Other Ambulatory Visit: Payer: Self-pay

## 2020-12-21 ENCOUNTER — Ambulatory Visit: Payer: Medicare HMO | Admitting: Medical

## 2020-12-21 VITALS — BP 140/78 | HR 81 | Ht 75.0 in | Wt 216.4 lb

## 2020-12-21 DIAGNOSIS — I2510 Atherosclerotic heart disease of native coronary artery without angina pectoris: Secondary | ICD-10-CM | POA: Diagnosis not present

## 2020-12-21 DIAGNOSIS — Z01810 Encounter for preprocedural cardiovascular examination: Secondary | ICD-10-CM

## 2020-12-21 DIAGNOSIS — I10 Essential (primary) hypertension: Secondary | ICD-10-CM | POA: Diagnosis not present

## 2020-12-21 DIAGNOSIS — F17200 Nicotine dependence, unspecified, uncomplicated: Secondary | ICD-10-CM

## 2020-12-21 DIAGNOSIS — E785 Hyperlipidemia, unspecified: Secondary | ICD-10-CM | POA: Diagnosis not present

## 2020-12-21 DIAGNOSIS — R030 Elevated blood-pressure reading, without diagnosis of hypertension: Secondary | ICD-10-CM

## 2020-12-21 DIAGNOSIS — I723 Aneurysm of iliac artery: Secondary | ICD-10-CM

## 2020-12-21 NOTE — Patient Instructions (Signed)
Medication Instructions:  Your physician recommends that you continue on your current medications as directed. Please refer to the Current Medication list given to you today.  *If you need a refill on your cardiac medications before your next appointment, please call your pharmacy*   Lab Work: None If you have labs (blood work) drawn today and your tests are completely normal, you will receive your results only by: [place] (if you have MyChart) OR A paper copy in the mail If you have any lab test that is abnormal or we need to change your treatment, we will call you to review the results.    Follow-Up: At [facility], you and your health needs are our priority.  As part of our continuing mission to provide you with exceptional heart care, we have created designated Provider Care Teams.  These Care Teams include your primary Cardiologist (physician) and Advanced Practice Providers (APPs -  Physician Assistants and Nurse Practitioners) who all work together to provide you with the care you need, when you need it.  We recommend signing up for the patient portal called "MyChart".  Sign up information is provided on this After Visit Summary.  MyChart is used to connect with patients for Virtual Visits (Telemedicine).  Patients are able to view lab/test results, encounter notes, upcoming appointments, etc.  Non-urgent messages can be sent to your provider as well.   To learn more about what you can do with MyChart, go to [email or web address].    Your next appointment:   3 month(s)  The format for your next appointment:   In Person  Provider:   You may see Kate Sable, MD or one of the following Advanced Practice Providers on your designated Care Team:   Murray Hodgkins, NP Christell Faith, PA-C Cadence Kathlen Mody, [place]

## 2020-12-24 ENCOUNTER — Telehealth (INDEPENDENT_AMBULATORY_CARE_PROVIDER_SITE_OTHER): Payer: Self-pay

## 2020-12-24 NOTE — Telephone Encounter (Signed)
Patient left a voicemail stating that he reach out to schedule CT scan but the earliest date was 01/15/21 .Patient CT new date is schedule 12/27/20 at [facility] arrival time at 4:45pm. Patient can have no solids after 1 pm and can have liquids 4 hours prior. Patient was made aware with new appointment date.

## 2020-12-27 ENCOUNTER — Ambulatory Visit (HOSPITAL_COMMUNITY): Payer: Medicare HMO

## 2020-12-28 ENCOUNTER — Ambulatory Visit (INDEPENDENT_AMBULATORY_CARE_PROVIDER_SITE_OTHER): Payer: Medicare HMO | Admitting: Vascular Surgery

## 2021-01-01 ENCOUNTER — Ambulatory Visit (HOSPITAL_COMMUNITY)
Admission: RE | Admit: 2021-01-01 | Discharge: 2021-01-01 | Disposition: A | Discharge status: Home / Self Care | Payer: Medicare HMO | Source: Ambulatory Visit | Attending: Vascular Surgery | Admitting: Vascular Surgery

## 2021-01-01 DIAGNOSIS — I713 Abdominal aortic aneurysm, ruptured, unspecified: Secondary | ICD-10-CM | POA: Diagnosis present

## 2021-01-01 LAB — POCT I-STAT CREATININE: Creatinine, Ser: 1.1 mg/dL (ref 0.61–1.24)

## 2021-01-01 MED ORDER — IOHEXOL 350 MG/ML SOLN
90.0000 mL | Freq: Once | INTRAVENOUS | Status: AC | PRN
  Administered 2021-01-01: 90 mL via INTRAVENOUS

## 2021-01-04 ENCOUNTER — Ambulatory Visit (INDEPENDENT_AMBULATORY_CARE_PROVIDER_SITE_OTHER): Payer: Medicare HMO | Admitting: Vascular Surgery

## 2021-01-08 ENCOUNTER — Other Ambulatory Visit: Payer: Self-pay

## 2021-01-08 ENCOUNTER — Encounter (INDEPENDENT_AMBULATORY_CARE_PROVIDER_SITE_OTHER): Payer: Self-pay | Admitting: Vascular Surgery

## 2021-01-08 ENCOUNTER — Telehealth (INDEPENDENT_AMBULATORY_CARE_PROVIDER_SITE_OTHER): Payer: Self-pay

## 2021-01-08 ENCOUNTER — Ambulatory Visit (INDEPENDENT_AMBULATORY_CARE_PROVIDER_SITE_OTHER): Payer: Medicare HMO | Admitting: Vascular Surgery

## 2021-01-08 VITALS — BP 160/77 | HR 76 | Resp 16 | Wt 220.0 lb

## 2021-01-08 DIAGNOSIS — E785 Hyperlipidemia, unspecified: Secondary | ICD-10-CM | POA: Diagnosis not present

## 2021-01-08 DIAGNOSIS — I723 Aneurysm of iliac artery: Secondary | ICD-10-CM | POA: Diagnosis not present

## 2021-01-08 DIAGNOSIS — Z87891 Personal history of nicotine dependence: Secondary | ICD-10-CM | POA: Diagnosis not present

## 2021-01-08 NOTE — Progress Notes (Signed)
MRN : [id number]  Blake Padilla is a 70 y.o. (09-Sep-1950) male who presents with chief complaint of  Chief Complaint  Patient presents with   Follow-up    Ct results  .  History of Present Illness: Patient returns today in follow up of his iliac artery aneurysms.  He continues to have some abdominal fullness and pressure, but no signs of peripheral embolization and no severe worsening pain. I have independently reviewed his CT angiogram.  His aorta becomes ectatic and mildly aneurysmal distally, but is largely normal throughout most of its course.  The right common iliac artery is aneurysmal measuring about 2.3 to 2.4 cm.  The right external iliac artery is generous measuring about 1.4 to 1.5 cm.  The right hypogastric artery is patent.  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  Current Outpatient Medications  Medication Sig Dispense Refill   aspirin EC 81 MG tablet Take 1 tablet (81 mg total) by mouth daily. Swallow whole. 90 tablet 3   ezetimibe (ZETIA) 10 MG tablet Take 1 tablet (10 mg total) by mouth daily. 90 tablet 3   pravastatin (PRAVACHOL) 20 MG tablet Take 1 tablet (20 mg total) by mouth every evening. Please schedule office visit for further refills. Thank you! 30 tablet 1   No current facility-administered medications for this visit.    Past Medical History:  Diagnosis Date   Coronary artery disease     Past Surgical History:  Procedure Laterality Date   NO PAST SURGERIES       Social History   Tobacco Use   Smoking status: Every Day    Packs/day: 1.00    Years: 47.00    Pack years: 47.00    Types: Cigarettes   Smokeless tobacco: Never  Vaping Use   Vaping Use: Never used  Substance Use Topics   Alcohol use: Not Currently    Comment: with colds   Drug use: Never      Family History   Problem Relation Age of Onset   Diabetes Mother    Hypertension Mother    Diabetes Father    Hypertension Father    Leukemia Sister    Diabetes Brother    Dementia Sister    COPD Sister    Healthy Sister      No Known Allergies  REVIEW OF SYSTEMS (Negative unless checked)   Constitutional: [] Weight loss  [] Fever  [] Chills Cardiac: [] Chest pain   [] Chest pressure   [] Palpitations   [] Shortness of breath when laying flat   [] Shortness of breath at rest   [] Shortness of breath with exertion. Vascular:  [x] Pain in legs with walking   [] Pain in legs at rest   [] Pain in legs when laying flat   [] Claudication   [] Pain in feet when walking  [] Pain in feet at rest  [] Pain in feet when laying flat   [] History of DVT   [] Phlebitis   [] Swelling in legs   [] Varicose veins   [] Non-healing ulcers Pulmonary:   [] Uses home oxygen   [] Productive cough   [] Hemoptysis   [] Wheeze  [] COPD   [] Asthma Neurologic:  [] Dizziness  [] Blackouts   [] Seizures   [] History of stroke   [] History of TIA  [] Aphasia   [] Temporary blindness   [] Dysphagia   [] Weakness or numbness in arms   [] Weakness or  numbness in legs Musculoskeletal:  [x] Arthritis   [] Joint swelling   [] Joint pain   [] Low back pain Hematologic:  [] Easy bruising  [] Easy bleeding   [] Hypercoagulable state   [] Anemic  [] Hepatitis Gastrointestinal:  [] Blood in stool   [] Vomiting blood  [] Gastroesophageal reflux/heartburn   [] Abdominal pain Genitourinary:  [] Chronic kidney disease   [] Difficult urination  [] Frequent urination  [] Burning with urination   [] Hematuria Skin:  [] Rashes   [] Ulcers   [] Wounds Psychological:  [] History of anxiety   []  History of major depression.  Physical Examination  BP (!) 160/77 (BP Location: Left Arm)   Pulse 76   Resp 16   Wt 220 lb (99.8 kg)   BMI 27.50 kg/m  Gen:  WD/WN, NAD. Appears younger than stated age. Head: [place]/AT, No temporalis wasting. Ear/Nose/Throat: Hearing grossly intact, nares w/o erythema or  drainage Eyes: Conjunctiva clear. Sclera non-icteric Neck: Supple.  Trachea midline Pulmonary:  Good air movement, no use of accessory muscles.  Cardiac: RRR, no JVD Vascular:  Vessel Right Left  Radial Palpable Palpable                                   Gastrointestinal: soft, non-tender/non-distended. No guarding/reflex.  Musculoskeletal: M/S 5/5 throughout.  No deformity or atrophy. No edema. Neurologic: Sensation grossly intact in extremities.  Symmetrical.  Speech is fluent.  Psychiatric: Judgment intact, Mood & affect appropriate for pt's clinical situation. Dermatologic: No rashes or ulcers noted.  No cellulitis or open wounds.      Labs Recent Results (from the past 2160 hour(s))  Lipid panel     Status: Abnormal   Collection Time: 10/26/20  9:15 AM  Result Value Ref Range   Cholesterol, Total 155 100 - 199 mg/dL   Triglycerides 61 0 - 149 mg/dL   HDL 39 (L) >39 mg/dL   VLDL Cholesterol Cal 12 5 - 40 mg/dL   LDL Chol Calc (NIH) 104 (H) 0 - 99 mg/dL   Chol/HDL Ratio 4.0 0.0 - 5.0 ratio    Comment:                                   T. Chol/HDL Ratio                                             Men  Women                               1/2 Avg.Risk  3.4    3.3                                   Avg.Risk  5.0    4.4                                2X Avg.Risk  9.6    7.1                                3X Avg.Risk 23.4  11.0   I-STAT creatinine     Status: None   Collection Time: 01/01/21  2:27 PM  Result Value Ref Range   Creatinine, Ser 1.10 0.61 - 1.24 mg/dL    Radiology US [facility] MEDICARE SCREENING  Addendum Date: 12/13/2020   ADDENDUM REPORT: 12/13/2020 10:15 ADDENDUM: In addition to vascular surgery referral, in the meantime CT angiography is suggested for better evaluation. These results will be called to the ordering clinician or representative by the Radiologist Assistant, and communication documented in the PACS or [employer]. Electronically  Signed   By: Zetta Bills M.D.   On: 12/13/2020 10:15   Result Date: 12/13/2020 CLINICAL DATA:  Tobacco use in a 70 year old male for AAA screening. EXAM: US ABDOMINAL [facility] MEDICARE SCREENING TECHNIQUE: Ultrasound examination of the abdominal aorta was performed as a screening evaluation for abdominal aortic aneurysm. COMPARISON:  None FINDINGS: Abdominal aortic measurements as follows: Proximal:  2.8 x 2.7 cm Mid:  2.4 x 2.4 cm Distal:  2.8 x 2.9 cm LEFT common iliac with dilation beginning just beyond the iliac bifurcation with fusiform dilation of this vessel up to 4.5 x 4.1 cm. Internal soft plaque/mural thrombus is noted. IMPRESSION: Marked aneurysmal dilation of the LEFT iliac artery in the common iliac just beyond the internal-external bifurcation up to 4.5 cm greatest axial dimension. Prompt referral to vascular surgery is suggested. Size places the patient at risk for rupture. Mild aneurysmal caliber of the distal abdominal aorta. Electronically Signed: By: Zetta Bills M.D. On: 12/13/2020 10:04   CT Angio Abd/Pel w/ and/or w/o  Result Date: 01/01/2021 CLINICAL DATA:  Preop planning Abdominal aortic aneurysm Left iliac artery aneurysm identified on ultrasound Left hip and leg pain EXAM: CTA ABDOMEN AND PELVIS WITHOUT AND WITH CONTRAST TECHNIQUE: Multidetector CT imaging of the abdomen and pelvis was performed using the standard protocol during bolus administration of intravenous contrast. Multiplanar reconstructed images and MIPs were obtained and reviewed to evaluate the vascular anatomy. CONTRAST:  90 mL OMNIPAQUE IOHEXOL 350 MG/ML SOLN COMPARISON:  Ultrasound abdominal aorta 12/13/2020 FINDINGS: VASCULAR Aorta: Minimal atheromatous plaque of the abdominal aorta without aneurysmal dilatation or flow-limiting stenosis. Celiac: Patent without evidence of aneurysm, dissection, vasculitis or significant stenosis. SMA: Patent without evidence of aneurysm, dissection, vasculitis or significant  stenosis. Renals: Both renal arteries are patent without evidence of aneurysm, dissection, vasculitis, fibromuscular dysplasia or significant stenosis. IMA: Patent. Inflow: Aneurysm of the left common iliac artery measuring 3.9 x 3.9 cm. Near complete occlusion at the origin of the left internal iliac artery. Distal branches of the left internal iliac artery are patent. No significant stenosis of the left external iliac artery. Aneurysmal dilatation of the right common iliac artery measuring up to 2.4 x 2.3 cm. Irregular plaque is noted within this aneurysmal segment. Moderate to severe stenosis at the origin of the right internal iliac artery which is otherwise patent. The right external iliac artery is mildly dilated in its proximal segment measuring up to 1.6 x 1.5 cm. Proximal Outflow: Bilateral common femoral and visualized portions of the superficial and profunda femoral arteries are patent without evidence of aneurysm, dissection, vasculitis or significant stenosis. Veins: No obvious venous abnormality within the limitations of this arterial phase study. Review of the MIP images confirms the above findings. NON-VASCULAR Lower chest: No acute abnormality. Hepatobiliary: No focal liver abnormality is seen. No gallstones, gallbladder wall thickening, or biliary dilatation. Pancreas: Unremarkable. No pancreatic ductal dilatation or surrounding inflammatory changes. Spleen: Normal in size without focal abnormality. Adrenals/Urinary  Tract: Right adrenal gland is normal in appearance. 2.1 x 1.6 cm low-density mass in the left adrenal gland medial limb is consistent with an adrenal adenoma. 10 mm hypodense lesion in the medial cortex of the left kidney is most likely a simple cyst. Kidneys, ureters, and bladder otherwise unremarkable. Stomach/Bowel: Stomach is within normal limits. Appendix appears normal. No evidence of bowel wall thickening, distention, or inflammatory changes. Lymphatic: No enlarged abdominal or  pelvic lymph nodes. Reproductive: Prostate is mildly enlarged. Other: No abdominal wall hernia or abnormality. No abdominopelvic ascites. Musculoskeletal: No acute or significant osseous findings. IMPRESSION: VASCULAR 1. Left common iliac artery aneurysm measuring up to 3.9 x 3.9 cm. 2. Right common iliac artery aneurysm measuring up to 2.4 x 2.3 cm 3. Dilation of the right external iliac artery measuring up to 1.6 x 1.5 cm. NON-VASCULAR No acute abnormality of the abdomen or pelvis. Electronically Signed   By: Miachel Roux M.D.   On: 01/01/2021 15:54    Assessment/Plan Personal history of tobacco use, presenting hazards to health Is a risk factor for aneurysm development as well as atherosclerotic occlusive disease.   Hyperlipidemia lipid control important in reducing the progression of atherosclerotic disease. Continue statin therapy  Iliac artery aneurysm (HCC) I have independently reviewed his CT angiogram.  His aorta becomes ectatic and mildly aneurysmal distally, but is largely normal throughout most of its course.  The right common iliac artery is aneurysmal measuring about 2.3 to 2.4 cm.  The right external iliac artery is generous measuring about 1.4 to 1.5 cm.  The right hypogastric artery is patent.  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  This will need to be repaired due to the risk of lethal rupture.  I think we can do this with a conventional Gore Excluder stent graft with extension into the left external iliac artery and a bell bottom into the distal right common iliac artery.  We may have to coil embolized the left hypogastric artery if it is patent although I think it is occluded.  I discussed the procedure in detail with the patient.  I discussed the risks and benefits.  He is agreeable to proceed.    [name],  MD  01/08/2021 3:04 PM    This note was created with Dragon medical transcription system.  Any errors from dictation are purely unintentional

## 2021-01-08 NOTE — Assessment & Plan Note (Signed)
I have independently reviewed his CT angiogram.  His aorta becomes ectatic and mildly aneurysmal distally, but is largely normal throughout most of its course.  The right common iliac artery is aneurysmal measuring about 2.3 to 2.4 cm.  The right external iliac artery is generous measuring about 1.4 to 1.5 cm.  The right hypogastric artery is patent.  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  This will need to be repaired due to the risk of lethal rupture.  I think we can do this with a conventional Gore Excluder stent graft with extension into the left external iliac artery and a bell bottom into the distal right common iliac artery.  We may have to coil embolized the left hypogastric artery if it is patent although I think it is occluded.  I discussed the procedure in detail with the patient.  I discussed the risks and benefits.  He is agreeable to proceed.

## 2021-01-08 NOTE — Telephone Encounter (Signed)
Spoke with the patient and he is scheduled with Dr. Lucky Cowboy for a AAA repair on 01/16/21 with a 8:15 am arrival time to the [facility]. Pre-op is a phone call between 8-1 pm and covid testing is on 01/14/21 between 8-12 pm at the [facility]. Pre-surgical instructions were discussed and will be mailed.

## 2021-01-08 NOTE — H&P (View-Only) (Signed)
MRN : [id number]  MARQUIST BINSTOCK is a 70 y.o. ([birth year]) male who presents with chief complaint of  Chief Complaint  Patient presents with   Follow-up    Ct results  .  History of Present Illness: Patient returns today in follow up of his iliac artery aneurysms.  He continues to have some abdominal fullness and pressure, but no signs of peripheral embolization and no severe worsening pain. I have independently reviewed his CT angiogram.  His aorta becomes ectatic and mildly aneurysmal distally, but is largely normal throughout most of its course.  The right common iliac artery is aneurysmal measuring about 2.3 to 2.4 cm.  The right external iliac artery is generous measuring about 1.4 to 1.5 cm.  The right hypogastric artery is patent.  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  Current Outpatient Medications  Medication Sig Dispense Refill   aspirin EC 81 MG tablet Take 1 tablet (81 mg total) by mouth daily. Swallow whole. 90 tablet 3   ezetimibe (ZETIA) 10 MG tablet Take 1 tablet (10 mg total) by mouth daily. 90 tablet 3   pravastatin (PRAVACHOL) 20 MG tablet Take 1 tablet (20 mg total) by mouth every evening. Please schedule office visit for further refills. Thank you! 30 tablet 1   No current facility-administered medications for this visit.    Past Medical History:  Diagnosis Date   Coronary artery disease     Past Surgical History:  Procedure Laterality Date   NO PAST SURGERIES       Social History   Tobacco Use   Smoking status: Every Day    Packs/day: 1.00    Years: 47.00    Pack years: 47.00    Types: Cigarettes   Smokeless tobacco: Never  Vaping Use   Vaping Use: Never used  Substance Use Topics   Alcohol use: Not Currently    Comment: with colds   Drug use: Never      Family History   Problem Relation Age of Onset   Diabetes Mother    Hypertension Mother    Diabetes Father    Hypertension Father    Leukemia Sister    Diabetes Brother    Dementia Sister    COPD Sister    Healthy Sister      No Known Allergies  REVIEW OF SYSTEMS (Negative unless checked)   Constitutional: [] Weight loss  [] Fever  [] Chills Cardiac: [] Chest pain   [] Chest pressure   [] Palpitations   [] Shortness of breath when laying flat   [] Shortness of breath at rest   [] Shortness of breath with exertion. Vascular:  [x] Pain in legs with walking   [] Pain in legs at rest   [] Pain in legs when laying flat   [] Claudication   [] Pain in feet when walking  [] Pain in feet at rest  [] Pain in feet when laying flat   [] History of DVT   [] Phlebitis   [] Swelling in legs   [] Varicose veins   [] Non-healing ulcers Pulmonary:   [] Uses home oxygen   [] Productive cough   [] Hemoptysis   [] Wheeze  [] COPD   [] Asthma Neurologic:  [] Dizziness  [] Blackouts   [] Seizures   [] History of stroke   [] History of TIA  [] Aphasia   [] Temporary blindness   [] Dysphagia   [] Weakness or numbness in arms   [] Weakness or  numbness in legs Musculoskeletal:  [x] Arthritis   [] Joint swelling   [] Joint pain   [] Low back pain Hematologic:  [] Easy bruising  [] Easy bleeding   [] Hypercoagulable state   [] Anemic  [] Hepatitis Gastrointestinal:  [] Blood in stool   [] Vomiting blood  [] Gastroesophageal reflux/heartburn   [] Abdominal pain Genitourinary:  [] Chronic kidney disease   [] Difficult urination  [] Frequent urination  [] Burning with urination   [] Hematuria Skin:  [] Rashes   [] Ulcers   [] Wounds Psychological:  [] History of anxiety   []  History of major depression.  Physical Examination  BP (!) 160/77 (BP Location: Left Arm)   Pulse 76   Resp 16   Wt 220 lb (99.8 kg)   BMI 27.50 kg/m  Gen:  WD/WN, NAD. Appears younger than stated age. Head: [place]/AT, No temporalis wasting. Ear/Nose/Throat: Hearing grossly intact, nares w/o erythema or  drainage Eyes: Conjunctiva clear. Sclera non-icteric Neck: Supple.  Trachea midline Pulmonary:  Good air movement, no use of accessory muscles.  Cardiac: RRR, no JVD Vascular:  Vessel Right Left  Radial Palpable Palpable                                   Gastrointestinal: soft, non-tender/non-distended. No guarding/reflex.  Musculoskeletal: M/S 5/5 throughout.  No deformity or atrophy. No edema. Neurologic: Sensation grossly intact in extremities.  Symmetrical.  Speech is fluent.  Psychiatric: Judgment intact, Mood & affect appropriate for pt's clinical situation. Dermatologic: No rashes or ulcers noted.  No cellulitis or open wounds.      Labs Recent Results (from the past 2160 hour(s))  Lipid panel     Status: Abnormal   Collection Time: 10/26/20  9:15 AM  Result Value Ref Range   Cholesterol, Total 155 100 - 199 mg/dL   Triglycerides 61 0 - 149 mg/dL   HDL 39 (L) >39 mg/dL   VLDL Cholesterol Cal 12 5 - 40 mg/dL   LDL Chol Calc (NIH) 104 (H) 0 - 99 mg/dL   Chol/HDL Ratio 4.0 0.0 - 5.0 ratio    Comment:                                   T. Chol/HDL Ratio                                             Men  Women                               1/2 Avg.Risk  3.4    3.3                                   Avg.Risk  5.0    4.4                                2X Avg.Risk  9.6    7.1                                3X Avg.Risk 23.4  11.0   I-STAT creatinine     Status: None   Collection Time: 01/01/21  2:27 PM  Result Value Ref Range   Creatinine, Ser 1.10 0.61 - 1.24 mg/dL    Radiology US AORTA MEDICARE SCREENING  Addendum Date: 12/13/2020   ADDENDUM REPORT: 12/13/2020 10:15 ADDENDUM: In addition to vascular surgery referral, in the meantime CT angiography is suggested for better evaluation. These results will be called to the ordering clinician or representative by the Radiologist Assistant, and communication documented in the PACS or [employer]. Electronically  Signed   By: Zetta Bills M.D.   On: 12/13/2020 10:15   Result Date: 12/13/2020 CLINICAL DATA:  Tobacco use in a 70 year old male for AAA screening. EXAM: US ABDOMINAL AORTA MEDICARE SCREENING TECHNIQUE: Ultrasound examination of the abdominal aorta was performed as a screening evaluation for abdominal aortic aneurysm. COMPARISON:  None FINDINGS: Abdominal aortic measurements as follows: Proximal:  2.8 x 2.7 cm Mid:  2.4 x 2.4 cm Distal:  2.8 x 2.9 cm LEFT common iliac with dilation beginning just beyond the iliac bifurcation with fusiform dilation of this vessel up to 4.5 x 4.1 cm. Internal soft plaque/mural thrombus is noted. IMPRESSION: Marked aneurysmal dilation of the LEFT iliac artery in the common iliac just beyond the internal-external bifurcation up to 4.5 cm greatest axial dimension. Prompt referral to vascular surgery is suggested. Size places the patient at risk for rupture. Mild aneurysmal caliber of the distal abdominal aorta. Electronically Signed: By: Zetta Bills M.D. On: 12/13/2020 10:04   CT Angio Abd/Pel w/ and/or w/o  Result Date: 01/01/2021 CLINICAL DATA:  Preop planning Abdominal aortic aneurysm Left iliac artery aneurysm identified on ultrasound Left hip and leg pain EXAM: CTA ABDOMEN AND PELVIS WITHOUT AND WITH CONTRAST TECHNIQUE: Multidetector CT imaging of the abdomen and pelvis was performed using the standard protocol during bolus administration of intravenous contrast. Multiplanar reconstructed images and MIPs were obtained and reviewed to evaluate the vascular anatomy. CONTRAST:  90 mL OMNIPAQUE IOHEXOL 350 MG/ML SOLN COMPARISON:  Ultrasound abdominal aorta 12/13/2020 FINDINGS: VASCULAR Aorta: Minimal atheromatous plaque of the abdominal aorta without aneurysmal dilatation or flow-limiting stenosis. Celiac: Patent without evidence of aneurysm, dissection, vasculitis or significant stenosis. SMA: Patent without evidence of aneurysm, dissection, vasculitis or significant  stenosis. Renals: Both renal arteries are patent without evidence of aneurysm, dissection, vasculitis, fibromuscular dysplasia or significant stenosis. IMA: Patent. Inflow: Aneurysm of the left common iliac artery measuring 3.9 x 3.9 cm. Near complete occlusion at the origin of the left internal iliac artery. Distal branches of the left internal iliac artery are patent. No significant stenosis of the left external iliac artery. Aneurysmal dilatation of the right common iliac artery measuring up to 2.4 x 2.3 cm. Irregular plaque is noted within this aneurysmal segment. Moderate to severe stenosis at the origin of the right internal iliac artery which is otherwise patent. The right external iliac artery is mildly dilated in its proximal segment measuring up to 1.6 x 1.5 cm. Proximal Outflow: Bilateral common femoral and visualized portions of the superficial and profunda femoral arteries are patent without evidence of aneurysm, dissection, vasculitis or significant stenosis. Veins: No obvious venous abnormality within the limitations of this arterial phase study. Review of the MIP images confirms the above findings. NON-VASCULAR Lower chest: No acute abnormality. Hepatobiliary: No focal liver abnormality is seen. No gallstones, gallbladder wall thickening, or biliary dilatation. Pancreas: Unremarkable. No pancreatic ductal dilatation or surrounding inflammatory changes. Spleen: Normal in size without focal abnormality. Adrenals/Urinary  Tract: Right adrenal gland is normal in appearance. 2.1 x 1.6 cm low-density mass in the left adrenal gland medial limb is consistent with an adrenal adenoma. 10 mm hypodense lesion in the medial cortex of the left kidney is most likely a simple cyst. Kidneys, ureters, and bladder otherwise unremarkable. Stomach/Bowel: Stomach is within normal limits. Appendix appears normal. No evidence of bowel wall thickening, distention, or inflammatory changes. Lymphatic: No enlarged abdominal or  pelvic lymph nodes. Reproductive: Prostate is mildly enlarged. Other: No abdominal wall hernia or abnormality. No abdominopelvic ascites. Musculoskeletal: No acute or significant osseous findings. IMPRESSION: VASCULAR 1. Left common iliac artery aneurysm measuring up to 3.9 x 3.9 cm. 2. Right common iliac artery aneurysm measuring up to 2.4 x 2.3 cm 3. Dilation of the right external iliac artery measuring up to 1.6 x 1.5 cm. NON-VASCULAR No acute abnormality of the abdomen or pelvis. Electronically Signed   By: Miachel Roux M.D.   On: 01/01/2021 15:54    Assessment/Plan Personal history of tobacco use, presenting hazards to health Is a risk factor for aneurysm development as well as atherosclerotic occlusive disease.   Hyperlipidemia lipid control important in reducing the progression of atherosclerotic disease. Continue statin therapy  Iliac artery aneurysm (HCC) I have independently reviewed his CT angiogram.  His aorta becomes ectatic and mildly aneurysmal distally, but is largely normal throughout most of its course.  The right common iliac artery is aneurysmal measuring about 2.3 to 2.4 cm.  The right external iliac artery is generous measuring about 1.4 to 1.5 cm.  The right hypogastric artery is patent.  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  This will need to be repaired due to the risk of lethal rupture.  I think we can do this with a conventional Gore Excluder stent graft with extension into the left external iliac artery and a bell bottom into the distal right common iliac artery.  We may have to coil embolized the left hypogastric artery if it is patent although I think it is occluded.  I discussed the procedure in detail with the patient.  I discussed the risks and benefits.  He is agreeable to proceed.    [name],  MD  01/08/2021 3:04 PM    This note was created with Dragon medical transcription system.  Any errors from dictation are purely unintentional

## 2021-01-11 ENCOUNTER — Encounter
Admission: RE | Admit: 2021-01-11 | Discharge: 2021-01-11 | Disposition: A | Discharge status: Home / Self Care | Payer: Medicare HMO | Source: Ambulatory Visit | Attending: Vascular Surgery | Admitting: Vascular Surgery

## 2021-01-11 ENCOUNTER — Other Ambulatory Visit: Payer: Self-pay

## 2021-01-11 ENCOUNTER — Other Ambulatory Visit (INDEPENDENT_AMBULATORY_CARE_PROVIDER_SITE_OTHER): Payer: Self-pay | Admitting: Nurse Practitioner

## 2021-01-11 DIAGNOSIS — I723 Aneurysm of iliac artery: Secondary | ICD-10-CM

## 2021-01-11 HISTORY — DX: Chronic obstructive pulmonary disease, unspecified: J44.9

## 2021-01-11 NOTE — Patient Instructions (Signed)
Your procedure is scheduled on: 01/16/21 Report to [facility] at 8:15 am as instructed. Valet parking is available free of charge if interested.  Remember: Instructions that are not followed completely may result in serious medical risk, up to and including death, or upon the discretion of your surgeon and anesthesiologist your surgery may need to be rescheduled.     _X__ 1. Do not eat food or drink any liquids after midnight the night before your procedure.                 No gum chewing or hard candies.   __X__2.  On the morning of surgery brush your teeth with toothpaste and water, you                 may rinse your mouth with mouthwash if you wish.  Do not swallow any              toothpaste of mouthwash.     _X__ 3.  No Alcohol for 24 hours before or after surgery.   _X__ 4.  Do Not Smoke or use e-cigarettes For 24 Hours Prior to Your Surgery.                 Do not use any chewable tobacco products for at least 6 hours prior to                 surgery.  ____  5.  Bring all medications with you on the day of surgery if instructed.   __X__  6.  Notify your doctor if there is any change in your medical condition      (cold, fever, infections).     Do not wear jewelry, make-up, hairpins, clips or nail polish. Do not wear lotions, powders, or perfumes/cologne or deodorant.  Do not shave body hair 48 hours prior to surgery. Men may shave face and neck. Do not bring valuables to the hospital.    [facility] is not responsible for any belongings or valuables.  Contacts, dentures/partials or body piercings may not be worn into surgery. Bring a case for your contacts, glasses or hearing aids, a denture cup will be supplied. Leave your suitcase in the car. After surgery it may be brought to your room. For patients admitted to the hospital, discharge time is determined by your treatment team.   Patients discharged the day of surgery will not be allowed to drive home.   Please  read over the following fact sheets that you were given:   CHG soap  __X__ Take these medicines the morning of surgery with A SIP OF WATER:    1. none  2.   3.   4.  5.  6.  ____ Fleet Enema (as directed)   __X__ Use CHG Soap/SAGE wipes as directed (use half bottle for each shower)  ____ Use inhalers on the day of surgery  ____ Stop metformin/Janumet/Farxiga 2 days prior to surgery    ____ Take 1/2 of usual insulin dose the night before surgery. No insulin the morning          of surgery.   ____ Stop Blood Thinners Coumadin/Plavix/Xarelto/Pleta/Pradaxa/Eliquis/Effient/Aspirin  on   Or contact your Surgeon, Cardiologist or Medical Doctor regarding  ability to stop your blood thinners  __X__ Stop Anti-inflammatories 7 days before surgery such as Advil, Ibuprofen, Motrin,  BC or Goodies Powder, Naprosyn, Naproxen, Aleve,  You may take Tylenol instead if needed for pain  __X__ Stop  all herbals and supplements, fish oil and vitamins  until after surgery.    ____ Bring C-Pap to the hospital.

## 2021-01-14 ENCOUNTER — Other Ambulatory Visit: Payer: Medicare HMO

## 2021-01-14 ENCOUNTER — Other Ambulatory Visit: Payer: Self-pay

## 2021-01-14 ENCOUNTER — Other Ambulatory Visit
Admission: RE | Admit: 2021-01-14 | Discharge: 2021-01-14 | Disposition: A | Discharge status: Home / Self Care | Payer: Medicare HMO | Source: Ambulatory Visit | Attending: Vascular Surgery | Admitting: Vascular Surgery

## 2021-01-14 DIAGNOSIS — I723 Aneurysm of iliac artery: Secondary | ICD-10-CM | POA: Insufficient documentation

## 2021-01-14 DIAGNOSIS — Z01812 Encounter for preprocedural laboratory examination: Secondary | ICD-10-CM | POA: Insufficient documentation

## 2021-01-14 DIAGNOSIS — Z20822 Contact with and (suspected) exposure to covid-19: Secondary | ICD-10-CM | POA: Insufficient documentation

## 2021-01-14 LAB — BASIC METABOLIC PANEL
Anion gap: 5 (ref 5–15)
BUN: 15 mg/dL (ref 8–23)
CO2: 28 mmol/L (ref 22–32)
Calcium: 8.8 mg/dL — ABNORMAL LOW (ref 8.9–10.3)
Chloride: 107 mmol/L (ref 98–111)
Creatinine, Ser: 1.09 mg/dL (ref 0.61–1.24)
GFR, Estimated: >60 mL/min (ref >60)
Glucose, Bld: 118 mg/dL — ABNORMAL HIGH (ref 70–99)
Potassium: 4 mmol/L (ref 3.5–5.1)
Sodium: 140 mmol/L (ref 135–145)

## 2021-01-14 LAB — CBC WITH DIFFERENTIAL/PLATELET
Abs Immature Granulocytes: 0.03 10*3/uL (ref 0.00–0.07)
Basophils Absolute: 0.1 10*3/uL (ref 0.0–0.1)
Basophils Relative: 1 %
Eosinophils Absolute: 0.3 10*3/uL (ref 0.0–0.5)
Eosinophils Relative: 4 %
HCT: 45.9 % (ref 39.0–52.0)
Hemoglobin: 14.8 g/dL (ref 13.0–17.0)
Immature Granulocytes: 0 %
Lymphocytes Relative: 40 %
Lymphs Abs: 2.9 10*3/uL (ref 0.7–4.0)
MCH: 28.6 pg (ref 26.0–34.0)
MCHC: 32.2 g/dL (ref 30.0–36.0)
MCV: 88.6 fL (ref 80.0–100.0)
Monocytes Absolute: 0.8 10*3/uL (ref 0.1–1.0)
Monocytes Relative: 11 %
Neutro Abs: 3.2 10*3/uL (ref 1.7–7.7)
Neutrophils Relative %: 44 %
Platelets: 177 10*3/uL (ref 150–400)
RBC: 5.18 MIL/uL (ref 4.22–5.81)
RDW: 12.6 % (ref 11.5–15.5)
WBC: 7.2 10*3/uL (ref 4.0–10.5)
nRBC: 0 % (ref 0.0–0.2)

## 2021-01-14 LAB — TYPE AND SCREEN
ABO/RH(D): B POS
Antibody Screen: NEGATIVE

## 2021-01-15 ENCOUNTER — Ambulatory Visit: Payer: Medicare HMO

## 2021-01-15 ENCOUNTER — Ambulatory Visit (INDEPENDENT_AMBULATORY_CARE_PROVIDER_SITE_OTHER): Payer: Medicare HMO | Admitting: Vascular Surgery

## 2021-01-15 LAB — SARS CORONAVIRUS 2 (TAT 6-24 HRS): SARS Coronavirus 2: NEGATIVE

## 2021-01-16 ENCOUNTER — Inpatient Hospital Stay
Admission: RE | Admit: 2021-01-16 | Discharge: 2021-01-17 | DRG: 272 | Disposition: A | Discharge status: Home / Self Care | Payer: Medicare HMO | Source: Ambulatory Visit | Attending: Vascular Surgery | Admitting: Vascular Surgery

## 2021-01-16 ENCOUNTER — Inpatient Hospital Stay: Payer: Medicare HMO | Admitting: Anesthesiology

## 2021-01-16 ENCOUNTER — Encounter: Payer: Self-pay | Admitting: Vascular Surgery

## 2021-01-16 ENCOUNTER — Other Ambulatory Visit: Payer: Self-pay

## 2021-01-16 ENCOUNTER — Encounter
Admission: RE | Disposition: A | Discharge status: Home / Self Care | Payer: Self-pay | Source: Ambulatory Visit | Attending: Vascular Surgery

## 2021-01-16 DIAGNOSIS — Z20822 Contact with and (suspected) exposure to covid-19: Secondary | ICD-10-CM | POA: Diagnosis present

## 2021-01-16 DIAGNOSIS — Z7982 Long term (current) use of aspirin: Secondary | ICD-10-CM | POA: Diagnosis not present

## 2021-01-16 DIAGNOSIS — F17210 Nicotine dependence, cigarettes, uncomplicated: Secondary | ICD-10-CM | POA: Diagnosis present

## 2021-01-16 DIAGNOSIS — I723 Aneurysm of iliac artery: Secondary | ICD-10-CM | POA: Diagnosis present

## 2021-01-16 DIAGNOSIS — Z833 Family history of diabetes mellitus: Secondary | ICD-10-CM | POA: Diagnosis not present

## 2021-01-16 DIAGNOSIS — E785 Hyperlipidemia, unspecified: Secondary | ICD-10-CM | POA: Diagnosis present

## 2021-01-16 DIAGNOSIS — Z8249 Family history of ischemic heart disease and other diseases of the circulatory system: Secondary | ICD-10-CM | POA: Diagnosis not present

## 2021-01-16 DIAGNOSIS — I714 Abdominal aortic aneurysm, without rupture, unspecified: Secondary | ICD-10-CM | POA: Diagnosis present

## 2021-01-16 DIAGNOSIS — Z806 Family history of leukemia: Secondary | ICD-10-CM

## 2021-01-16 DIAGNOSIS — I2510 Atherosclerotic heart disease of native coronary artery without angina pectoris: Secondary | ICD-10-CM | POA: Diagnosis present

## 2021-01-16 DIAGNOSIS — Z825 Family history of asthma and other chronic lower respiratory diseases: Secondary | ICD-10-CM | POA: Diagnosis not present

## 2021-01-16 DIAGNOSIS — Z79899 Other long term (current) drug therapy: Secondary | ICD-10-CM | POA: Diagnosis not present

## 2021-01-16 HISTORY — PX: ILIAC ARTERY ANEURYSM REPAIR: SUR1158

## 2021-01-16 HISTORY — DX: Aneurysm of iliac artery: I72.3

## 2021-01-16 HISTORY — PX: ENDOVASCULAR STENT GRAFT (AAA): CATH118280

## 2021-01-16 LAB — GLUCOSE, CAPILLARY: Glucose-Capillary: 121 mg/dL — ABNORMAL HIGH (ref 70–99)

## 2021-01-16 LAB — MRSA NEXT GEN BY PCR, NASAL: MRSA by PCR Next Gen: NOT DETECTED

## 2021-01-16 LAB — MAGNESIUM: Magnesium: 2 mg/dL (ref 1.7–2.4)

## 2021-01-16 LAB — ABO/RH: ABO/RH(D): B POS

## 2021-01-16 SURGERY — ENDOVASCULAR REPAIR/STENT GRAFT
Anesthesia: General

## 2021-01-16 MED ORDER — FAMOTIDINE IN NACL 20-0.9 MG/50ML-% IV SOLN
20.0000 mg | Freq: Two times a day (BID) | INTRAVENOUS | Status: DC
  Administered 2021-01-16 (×2): 20 mg via INTRAVENOUS
  Filled 2021-01-16 (×2): qty 50

## 2021-01-16 MED ORDER — ROCURONIUM BROMIDE 10 MG/ML (PF) SYRINGE
PREFILLED_SYRINGE | INTRAVENOUS | Status: AC
  Filled 2021-01-16: qty 10

## 2021-01-16 MED ORDER — GLYCOPYRROLATE 0.2 MG/ML IJ SOLN
INTRAMUSCULAR | Status: DC | PRN
  Administered 2021-01-16: .2 mg via INTRAVENOUS

## 2021-01-16 MED ORDER — LORAZEPAM 2 MG/ML IJ SOLN
1.0000 mg | Freq: Once | INTRAMUSCULAR | Status: AC
  Administered 2021-01-16: 1 mg via INTRAVENOUS
  Filled 2021-01-16: qty 1

## 2021-01-16 MED ORDER — DOCUSATE SODIUM 100 MG PO CAPS: 100.0000 mg | ORAL_CAPSULE | Freq: Every day | ORAL | Status: DC

## 2021-01-16 MED ORDER — PRAVASTATIN SODIUM 20 MG PO TABS
20.0000 mg | ORAL_TABLET | Freq: Every evening | ORAL | Status: DC
  Administered 2021-01-16: 20 mg via ORAL
  Filled 2021-01-16: qty 1

## 2021-01-16 MED ORDER — LACTATED RINGERS IV SOLN: INTRAVENOUS | Status: DC

## 2021-01-16 MED ORDER — ONDANSETRON HCL 4 MG/2ML IJ SOLN
INTRAMUSCULAR | Status: DC | PRN
  Administered 2021-01-16: 4 mg via INTRAVENOUS

## 2021-01-16 MED ORDER — CHLORHEXIDINE GLUCONATE 0.12 % MT SOLN
15.0000 mL | Freq: Once | OROMUCOSAL | Status: DC
  Filled 2021-01-16: qty 15

## 2021-01-16 MED ORDER — FAMOTIDINE 20 MG PO TABS
20.0000 mg | ORAL_TABLET | Freq: Once | ORAL | Status: AC
  Administered 2021-01-17: 20 mg via ORAL
  Filled 2021-01-16: qty 1

## 2021-01-16 MED ORDER — MAGNESIUM SULFATE 2 GM/50ML IV SOLN
2.0000 g | Freq: Every day | INTRAVENOUS | Status: DC | PRN
  Filled 2021-01-16: qty 50

## 2021-01-16 MED ORDER — FENTANYL CITRATE (PF) 100 MCG/2ML IJ SOLN
INTRAMUSCULAR | Status: DC | PRN
  Administered 2021-01-16 (×2): 50 ug via INTRAVENOUS

## 2021-01-16 MED ORDER — PHENOL 1.4 % MT LIQD
1.0000 | OROMUCOSAL | Status: DC | PRN
  Filled 2021-01-16: qty 177

## 2021-01-16 MED ORDER — CEFAZOLIN SODIUM-DEXTROSE 2-4 GM/100ML-% IV SOLN
2.0000 g | Freq: Three times a day (TID) | INTRAVENOUS | Status: AC
  Administered 2021-01-17: 2 g via INTRAVENOUS
  Filled 2021-01-16 (×2): qty 100

## 2021-01-16 MED ORDER — GUAIFENESIN-DM 100-10 MG/5ML PO SYRP: 15.0000 mL | ORAL_SOLUTION | ORAL | Status: DC | PRN

## 2021-01-16 MED ORDER — NITROGLYCERIN IN D5W 200-5 MCG/ML-% IV SOLN: 5.0000 ug/min | INTRAVENOUS | Status: DC

## 2021-01-16 MED ORDER — CEFAZOLIN SODIUM-DEXTROSE 2-4 GM/100ML-% IV SOLN
2.0000 g | INTRAVENOUS | Status: AC
  Administered 2021-01-16: 2 g via INTRAVENOUS

## 2021-01-16 MED ORDER — DEXAMETHASONE SODIUM PHOSPHATE 10 MG/ML IJ SOLN
INTRAMUSCULAR | Status: AC
  Filled 2021-01-16: qty 1

## 2021-01-16 MED ORDER — GLYCOPYRROLATE 0.2 MG/ML IJ SOLN
INTRAMUSCULAR | Status: AC
  Filled 2021-01-16: qty 1

## 2021-01-16 MED ORDER — SODIUM CHLORIDE 0.9 % IV SOLN: INTRAVENOUS | Status: DC

## 2021-01-16 MED ORDER — EZETIMIBE 10 MG PO TABS
10.0000 mg | ORAL_TABLET | Freq: Every day | ORAL | Status: DC
  Administered 2021-01-16 – 2021-01-17 (×2): 10 mg via ORAL
  Filled 2021-01-16 (×2): qty 1

## 2021-01-16 MED ORDER — ONDANSETRON HCL 4 MG/2ML IJ SOLN
INTRAMUSCULAR | Status: AC
  Filled 2021-01-16: qty 2

## 2021-01-16 MED ORDER — FENTANYL CITRATE (PF) 100 MCG/2ML IJ SOLN
INTRAMUSCULAR | Status: AC
  Filled 2021-01-16: qty 2

## 2021-01-16 MED ORDER — HEPARIN SODIUM (PORCINE) 1000 UNIT/ML IJ SOLN
INTRAMUSCULAR | Status: DC | PRN
  Administered 2021-01-16: 6000 [IU] via INTRAVENOUS

## 2021-01-16 MED ORDER — ONDANSETRON HCL 4 MG/2ML IJ SOLN: 4.0000 mg | Freq: Four times a day (QID) | INTRAMUSCULAR | Status: DC | PRN

## 2021-01-16 MED ORDER — CLOPIDOGREL BISULFATE 75 MG PO TABS
75.0000 mg | ORAL_TABLET | Freq: Every day | ORAL | Status: DC
  Administered 2021-01-17: 75 mg via ORAL
  Filled 2021-01-16: qty 1

## 2021-01-16 MED ORDER — IODIXANOL 320 MG/ML IV SOLN
INTRAVENOUS | Status: DC | PRN
  Administered 2021-01-16: 60 mL

## 2021-01-16 MED ORDER — LABETALOL HCL 5 MG/ML IV SOLN: 10.0000 mg | INTRAVENOUS | Status: DC | PRN

## 2021-01-16 MED ORDER — EPHEDRINE SULFATE 50 MG/ML IJ SOLN
INTRAMUSCULAR | Status: DC | PRN
  Administered 2021-01-16: 10 mg via INTRAVENOUS
  Administered 2021-01-16: 15 mg via INTRAVENOUS

## 2021-01-16 MED ORDER — PROPOFOL 10 MG/ML IV BOLUS
INTRAVENOUS | Status: DC | PRN
  Administered 2021-01-16: 150 mg via INTRAVENOUS

## 2021-01-16 MED ORDER — HYDRALAZINE HCL 20 MG/ML IJ SOLN: 5.0000 mg | INTRAMUSCULAR | Status: DC | PRN

## 2021-01-16 MED ORDER — FENTANYL CITRATE (PF) 100 MCG/2ML IJ SOLN
25.0000 ug | INTRAMUSCULAR | Status: DC | PRN
  Administered 2021-01-16 (×4): 25 ug via INTRAVENOUS

## 2021-01-16 MED ORDER — OXYCODONE HCL 5 MG/5ML PO SOLN: 5.0000 mg | Freq: Once | ORAL | Status: DC | PRN

## 2021-01-16 MED ORDER — ACETAMINOPHEN 325 MG PO TABS: 325.0000 mg | ORAL_TABLET | ORAL | Status: DC | PRN

## 2021-01-16 MED ORDER — MIDAZOLAM HCL 2 MG/2ML IJ SOLN
INTRAMUSCULAR | Status: AC
  Filled 2021-01-16: qty 2

## 2021-01-16 MED ORDER — ACETAMINOPHEN 10 MG/ML IV SOLN: 1000.0000 mg | Freq: Once | INTRAVENOUS | Status: DC | PRN

## 2021-01-16 MED ORDER — LIDOCAINE HCL (CARDIAC) PF 100 MG/5ML IV SOSY
PREFILLED_SYRINGE | INTRAVENOUS | Status: DC | PRN
  Administered 2021-01-16: 50 mg via INTRAVENOUS

## 2021-01-16 MED ORDER — METOPROLOL TARTRATE 5 MG/5ML IV SOLN: 2.0000 mg | INTRAVENOUS | Status: DC | PRN

## 2021-01-16 MED ORDER — ONDANSETRON HCL 4 MG/2ML IJ SOLN: 4.0000 mg | Freq: Once | INTRAMUSCULAR | Status: DC | PRN

## 2021-01-16 MED ORDER — OXYCODONE HCL 5 MG PO TABS: 5.0000 mg | ORAL_TABLET | Freq: Once | ORAL | Status: DC | PRN

## 2021-01-16 MED ORDER — LACTATED RINGERS IV SOLN
INTRAVENOUS | Status: DC
  Administered 2021-01-16: 1000 mL via INTRAVENOUS

## 2021-01-16 MED ORDER — ACETAMINOPHEN 650 MG RE SUPP: 325.0000 mg | RECTAL | Status: DC | PRN

## 2021-01-16 MED ORDER — EPHEDRINE 5 MG/ML INJ
INTRAVENOUS | Status: AC
  Filled 2021-01-16: qty 5

## 2021-01-16 MED ORDER — ROCURONIUM BROMIDE 100 MG/10ML IV SOLN
INTRAVENOUS | Status: DC | PRN
  Administered 2021-01-16: 50 mg via INTRAVENOUS
  Administered 2021-01-16: 30 mg via INTRAVENOUS

## 2021-01-16 MED ORDER — MIDAZOLAM HCL 2 MG/2ML IJ SOLN
INTRAMUSCULAR | Status: DC | PRN
  Administered 2021-01-16: 2 mg via INTRAVENOUS

## 2021-01-16 MED ORDER — CEFAZOLIN SODIUM-DEXTROSE 2-4 GM/100ML-% IV SOLN
INTRAVENOUS | Status: AC
  Administered 2021-01-16: 2 g via INTRAVENOUS
  Filled 2021-01-16: qty 100

## 2021-01-16 MED ORDER — POTASSIUM CHLORIDE CRYS ER 20 MEQ PO TBCR: 20.0000 meq | EXTENDED_RELEASE_TABLET | Freq: Every day | ORAL | Status: DC | PRN

## 2021-01-16 MED ORDER — DIPHENHYDRAMINE HCL 25 MG PO CAPS
25.0000 mg | ORAL_CAPSULE | Freq: Four times a day (QID) | ORAL | Status: DC | PRN
  Administered 2021-01-16: 25 mg via ORAL
  Filled 2021-01-16 (×2): qty 1

## 2021-01-16 MED ORDER — LIDOCAINE HCL (PF) 2 % IJ SOLN
INTRAMUSCULAR | Status: AC
  Filled 2021-01-16: qty 10

## 2021-01-16 MED ORDER — ASPIRIN EC 81 MG PO TBEC
81.0000 mg | DELAYED_RELEASE_TABLET | Freq: Every day | ORAL | Status: DC
  Administered 2021-01-16 – 2021-01-17 (×2): 81 mg via ORAL
  Filled 2021-01-16 (×2): qty 1

## 2021-01-16 MED ORDER — OXYCODONE-ACETAMINOPHEN 5-325 MG PO TABS: 1.0000 | ORAL_TABLET | ORAL | Status: DC | PRN

## 2021-01-16 MED ORDER — ORAL CARE MOUTH RINSE: 15.0000 mL | Freq: Once | OROMUCOSAL | Status: DC

## 2021-01-16 MED ORDER — DOPAMINE-DEXTROSE 3.2-5 MG/ML-% IV SOLN: 3.0000 ug/kg/min | INTRAVENOUS | Status: DC

## 2021-01-16 MED ORDER — ALUM & MAG HYDROXIDE-SIMETH 200-200-20 MG/5ML PO SUSP: 15.0000 mL | ORAL | Status: DC | PRN

## 2021-01-16 MED ORDER — MORPHINE SULFATE (PF) 2 MG/ML IV SOLN: 2.0000 mg | INTRAVENOUS | Status: DC | PRN

## 2021-01-16 MED ORDER — DEXAMETHASONE SODIUM PHOSPHATE 10 MG/ML IJ SOLN
INTRAMUSCULAR | Status: DC | PRN
  Administered 2021-01-16: 10 mg via INTRAVENOUS

## 2021-01-16 MED ORDER — HYDROMORPHONE HCL 1 MG/ML IJ SOLN: 1.0000 mg | Freq: Once | INTRAMUSCULAR | Status: DC | PRN

## 2021-01-16 MED ORDER — SUGAMMADEX SODIUM 200 MG/2ML IV SOLN
INTRAVENOUS | Status: DC | PRN
  Administered 2021-01-16: 200 mg via INTRAVENOUS

## 2021-01-16 MED ORDER — CHLORHEXIDINE GLUCONATE CLOTH 2 % EX PADS
6.0000 | MEDICATED_PAD | Freq: Once | CUTANEOUS | Status: AC
  Administered 2021-01-16: 6 via TOPICAL

## 2021-01-16 MED ORDER — SODIUM CHLORIDE 0.9 % IV SOLN: 500.0000 mL | Freq: Once | INTRAVENOUS | Status: DC | PRN

## 2021-01-16 MED ORDER — ACETAMINOPHEN 325 MG PO TABS
650.0000 mg | ORAL_TABLET | Freq: Four times a day (QID) | ORAL | Status: DC | PRN
  Administered 2021-01-16: 650 mg via ORAL
  Filled 2021-01-16: qty 2

## 2021-01-16 SURGICAL SUPPLY — 32 items
CATH ACCU-VU SIZ PIG 5F 70CM (CATHETERS) ×2 IMPLANT
CATH BALLN CODA 9X100X32 (BALLOONS) ×2 IMPLANT
CATH BEACON 5 .035 65 KMP TIP (CATHETERS) ×2 IMPLANT
CATH COBRA TEMPO C2 5X65 (CATHETERS) ×2 IMPLANT
CATH MICROCATH PRGRT 2.8F 110 (CATHETERS) ×1 IMPLANT
CATH TEMPO 5F RIM 65CM (CATHETERS) ×2 IMPLANT
CLOSURE PERCLOSE PROSTYLE (VASCULAR PRODUCTS) ×8 IMPLANT
COIL 400 COMPLEX SOFT 8X60CM (Vascular Products) ×2 IMPLANT
COIL 400 COMPLEX STD 8X40CM (Vascular Products) ×2 IMPLANT
COVER DRAPE FLUORO 36X44 (DRAPES) ×4 IMPLANT
COVER PROBE U/S 5X48 (MISCELLANEOUS) ×2 IMPLANT
DEVICE SAFEGUARD 24CM (GAUZE/BANDAGES/DRESSINGS) ×4 IMPLANT
DEVICE TORQUE (MISCELLANEOUS) ×2 IMPLANT
DEVICE TORQUE .025-.038 (MISCELLANEOUS) ×2 IMPLANT
DRYSEAL FLEXSHEATH 16FR 33CM (SHEATH) ×2
EXCLUDER TNK LEG 26MX14X16 (Endovascular Graft) ×1 IMPLANT
EXCLUDER TRUNK LEG 26MX14X16 (Endovascular Graft) ×2 IMPLANT
GLIDEWIRE STIFF .35X180X3 HYDR (WIRE) ×2 IMPLANT
HANDLE DETACHMENT COIL (MISCELLANEOUS) ×2 IMPLANT
LEG CONTRALATERAL 16X14.5X12 (Vascular Products) ×2 IMPLANT
LEG CONTRALATERAL 27X10 (Vascular Products) ×2 IMPLANT
MICROCATH PROGREAT 2.8F 110 CM (CATHETERS) ×2
NEEDLE ENTRY 21GA 7CM ECHOTIP (NEEDLE) ×2 IMPLANT
PACK ANGIOGRAPHY (CUSTOM PROCEDURE TRAY) ×2 IMPLANT
SHEATH BRITE TIP 6FRX11 (SHEATH) ×4 IMPLANT
SHEATH BRITE TIP 8FRX11 (SHEATH) ×4 IMPLANT
SHEATH DRYSEAL FLEX 16FR 33CM (SHEATH) ×2 IMPLANT
SPONGE XRAY 4X4 16PLY STRL (MISCELLANEOUS) ×6 IMPLANT
SYR MEDRAD MARK 7 150ML (SYRINGE) ×2 IMPLANT
TUBING CONTRAST HIGH PRESS 72 (TUBING) ×2 IMPLANT
WIRE AMPLATZ SSTIFF .035X260CM (WIRE) ×4 IMPLANT
WIRE GUIDERIGHT .035X150 (WIRE) ×4 IMPLANT

## 2021-01-16 NOTE — Interval H&P Note (Signed)
History and Physical Interval Note:  01/16/2021 9:09 AM  Blake Padilla  has presented today for surgery, with the diagnosis of AAA   GORE   AAA repair [place] Anesthesia Covid Test 11/23.  The various methods of treatment have been discussed with the patient and family. After consideration of risks, benefits and other options for treatment, the patient has consented to  Procedure(s): ENDOVASCULAR REPAIR/STENT GRAFT (N/A) as a surgical intervention.  The patient's history has been reviewed, patient examined, no change in status, stable for surgery.  I have reviewed the patient's chart and labs.  Questions were answered to the patient's satisfaction.     [name]

## 2021-01-16 NOTE — Anesthesia Preprocedure Evaluation (Addendum)
Anesthesia Evaluation  Patient identified by MRN, date of birth, ID band Patient awake    Reviewed: Allergy & Precautions, NPO status , Patient's Chart, lab work & pertinent test results  History of Anesthesia Complications Negative for: history of anesthetic complications  Airway Mallampati: III   Neck ROM: Full    Dental  (+) Upper Dentures, Partial Lower   Pulmonary COPD, Current Smoker (3/4 ppd) and Patient abstained from smoking.,    Pulmonary exam normal breath sounds clear to auscultation       Cardiovascular + CAD and + Peripheral Vascular Disease (iliac artery aneurysms)  Normal cardiovascular exam Rhythm:Regular Rate:Normal  ECG 12/21/20: NSR, HR 81bpm, LVH, nonspecific T wave changes  Echo 03/2020 1. Left ventricular ejection fraction, by estimation, is 60 to 65%. The left ventricle has normal function. The left ventricle has no regional wall motion abnormalities. There is mild to moderate left ventricular hypertrophy. Left ventricular diastolic parameters are consistent with Grade I diastolic dysfunction (impaired relaxation). The average left ventricular global longitudinal strain is -12.8 %. The global longitudinal strain is abnormal.  2. Right ventricular systolic function is normal. The right ventricular size is normal.  3. Left atrial size was mildly dilated.  4. The mitral valve is normal in structure. Mild mitral valve  regurgitation.    Neuro/Psych negative neurological ROS     GI/Hepatic negative GI ROS,   Endo/Other  negative endocrine ROS  Renal/GU negative Renal ROS     Musculoskeletal   Abdominal   Peds  Hematology negative hematology ROS (+)   Anesthesia Other Findings Cardiology note 12/21/20:  ASSESSMENT:   1. Pre-operative cardiovascular examination  2. Coronary artery disease involving native coronary artery of native heart without angina pectoris  3. Hyperlipidemia LDL goal <70   4. Essential hypertension  5. Smoking  6. Elevated BP without diagnosis of hypertension  7. Iliac artery aneurysm (HCC)   PLAN:   In order of problems listed above:  CAD by CT Patient denies anginal symptoms. He is very active at baseline. Continue ASA and statin. Echo 03/2020 showed normal EF and no WMA. NO further work-up at this time.   HLD LDL 104 10/2020. Continue Pravachol and Zetia  Tobacco use Has decreased to 1/2 ppd. Complete cessation encouraged.   Elevated BP BP today 140/78. EKG with LVH, echo with mild to mod hypertrophy. Has not been on antihypertensives in the past. Suspect he may need medication to control bp. Will check bp at home and can reassess at follow-up.   Cardiac evaluation for repair of Iliac artery aneurysm  Plan for intravascular Iliac artery aneurysm repair measuring 4.5cm, no date set for surgery. Following with Dr. Lucky Cowboy. Patient reports he is active at baseline, works at the golf course, and walking during the day. He has no anginal symptoms. He is still smoking, but trying to quit. Echo 2022 showed LVEF 60-65%, G1DD.  He denies LLE, orthopnea, pnd. BP elevated, but this can be addressed at follow-up. EKG shows SR with no ischemic changes. METS>4. According to revised cardiac index score he is Class 2 Risk with 6% 30 day risk of death, MI or cardiac arrest. OK to proceed with surgery with no further cardiac testing.   Disposition: Follow up in 3 month(s) with MD/APP  Signed, Cadence Ninfa Meeker, PA-C  12/21/2020 10:02 AM     Reproductive/Obstetrics  Anesthesia Physical Anesthesia Plan  ASA: 4  Anesthesia Plan: General   Post-op Pain Management:    Induction: Intravenous  PONV Risk Score and Plan: 1 and Propofol infusion, TIVA and Treatment may vary due to age or medical condition  Airway Management Planned: Oral ETT  Additional Equipment:   Intra-op Plan:   Post-operative Plan:  Extubation in OR  Informed Consent: I have reviewed the patients History and Physical, chart, labs and discussed the procedure including the risks, benefits and alternatives for the proposed anesthesia with the patient or authorized representative who has indicated his/her understanding and acceptance.     Dental advisory given  Plan Discussed with: CRNA  Anesthesia Plan Comments: (Patient consented for risks of anesthesia including but not limited to:  - adverse reactions to medications - damage to eyes, teeth, lips or other oral mucosa - nerve damage due to positioning  - sore throat or hoarseness - damage to heart, brain, nerves, lungs, other parts of body or loss of life  Informed patient about role of CRNA in peri- and intra-operative care.  Patient voiced understanding.)       Anesthesia Quick Evaluation

## 2021-01-16 NOTE — Op Note (Signed)
OPERATIVE NOTE   PROCEDURE: US guidance for vascular access, bilateral femoral arteries Catheter placement into aorta from bilateral femoral approaches Catheter placement to left hypogastric artery from left femoral approach with selective angiogram of the left hypogastric artery Coil embolization of the left hypogastric artery with an 8 mm diameter by 40 cm standard and an 8 mm diameter by 60 cm standard Ruby coil Placement of a 26 mm proximal, 16 cm length Gore Excluder Endoprosthesis main body left with a 27 mm diameter by 10 cm length right iliac contralateral limb Placement of a 14 mm diameter by 12 cm length left iliac extension limb to the left external iliac artery to exclude the aneurysm which went past the origin of the left external iliac artery ProGlide closure devices bilateral femoral arteries  PRE-OPERATIVE DIAGNOSIS: bilateral iliac artery aneurysms, left > right  POST-OPERATIVE DIAGNOSIS: same  SURGEON: [name], MD and Hortencia Pilar, MD - Co-surgeons  ANESTHESIA: General  ESTIMATED BLOOD LOSS: 25 cc  FINDING(S): 1.  Bilateral iliac artery aneurysms left bigger than the right.  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 male who presents with large iliac artery aneurysm particularly on the left where it measured about 4 cm in diameter. The anatomy was suitable for endovascular repair.  Risks and benefits of repair in an endovascular fashion were discussed and informed consent was obtained. Co-surgeons are used to expedite the procedure and reduce operative time as bilateral work needs to be done.  DESCRIPTION: After obtaining full informed written consent, the patient was brought back to the operating room and placed supine upon the operating table.  The patient received IV antibiotics prior to induction.  After obtaining adequate anesthesia, the  patient was prepped and draped in the standard fashion for endovascular AAA repair.  We then began by gaining access to both femoral arteries with US guidance with me working on the left and Dr. Delana Meyer working on the right.  The femoral arteries were found to be patent and accessed without difficulty with a needle under ultrasound guidance without difficulty on each side and permanent images were recorded.  We then placed 2 proglide devices on each side in a pre-close fashion and placed 8 French sheaths. The patient was then given 6000 units of intravenous heparin.  We began by performing a retrograde angiogram from the left femoral sheath to evaluate the left iliac system.  The left hypogastric artery was found to be open although diseased.  This was not a candidate for an iliac branch device and would require coil embolization to avoid retrograde flow through the hypogastric artery creating an endoleak.  A C2 catheter was used to selectively cannulate the left hypogastric artery from the left femoral approach without difficulty and selective image showed a diseased left hypogastric artery with about a 4 to 5 cm main body before primary bifurcation.  I then advanced a prograde microcatheter out to the internal iliac artery bifurcation.  I then deployed an 8 mm diameter by 40 cm length Ruby coil and an 8 mm diameter by 60 cm length Ruby coil and successful embolization was confirmed after removing the prograde microcatheter and performing an injection through the C2 catheter.  We then turned our attention to the aneurysm repair.   The Pigtail catheter was placed into the aorta from the left side. Using this image, we selected a 26  mm diameter by 16 cm length Main body device.  Over a stiff wire, an 16 French sheath was placed up the left side. The main body was then placed through the 16 French sheath on the left side.  We went ahead and upsized to a 4 French sheath on the right side to ensure that we did not  get jailed as the contralateral gate would come close to the aortic bifurcation after deployment. A Kumpe catheter was placed up the right side and a magnified image at the renal arteries was performed. The main body was then deployed just below the renal arteries which were roughly at the same level. The Kumpe catheter was used to cannulate the contralateral gate without difficulty and successful cannulation was confirmed by twirling the pigtail catheter in the main body. We then placed a stiff wire and a retrograde arteriogram was performed through the right femoral sheath.  We then selected a 27 mm diameter by 10 cm length right iliac extension limb to bell bottom to just above the origin of the right hypogastric artery.  The main body deployment was then completed. Based off the angiographic findings, extension limbs were necessary.  A 14 mm diameter by 12 cm length left iliac extension limb down several centimeters into the left external iliac artery to completely exclude the aneurysm which went down to and just beyond the origin of the left hypogastric artery. All junction points and seals zones were treated with the compliant balloon. The pigtail catheter was then replaced and a completion angiogram was performed.  No endoleak was detected on completion angiography. The renal arteries were found to be widely patent.  The right hypogastric artery was widely patent.  Successful embolization of the left hypogastric artery was seen. At this point we elected to terminate the procedure. We secured the pro glide devices for hemostasis on the femoral arteries. The skin incision was closed with a 4-0 Monocryl. Dermabond and pressure dressing were placed. The patient was taken to the recovery room in stable condition having tolerated the procedure well.  COMPLICATIONS: none  CONDITION: stable  [name]  01/16/2021, 12:47 PM   This note was created with Dragon Medical transcription system. Any errors in  dictation are purely unintentional.

## 2021-01-16 NOTE — Anesthesia Procedure Notes (Signed)
Procedure Name: Intubation Date/Time: 01/16/2021 11:04 AM Performed by: Rolla Plate, CRNA Pre-anesthesia Checklist: Patient identified, Patient being monitored, Timeout performed, Emergency Drugs available and Suction available Patient Re-evaluated:Patient Re-evaluated prior to induction Oxygen Delivery Method: Circle system utilized Preoxygenation: Pre-oxygenation with 100% oxygen Induction Type: IV induction Ventilation: Mask ventilation without difficulty Laryngoscope Size: Mac and 4 Grade View: Grade I Tube type: Oral Tube size: 7.5 mm Number of attempts: 1 Airway Equipment and Method: Stylet Placement Confirmation: ETT inserted through vocal cords under direct vision, positive ETCO2 and breath sounds checked- equal and bilateral Secured at: 23 cm Tube secured with: Tape Dental Injury: Teeth and Oropharynx as per pre-operative assessment

## 2021-01-16 NOTE — Op Note (Signed)
OPERATIVE NOTE   PROCEDURE: US guidance for vascular access, bilateral femoral arteries Catheter placement into aorta from bilateral femoral approaches Placement of a 26 x 14 x 16 C3 Gore Excluder Endoprosthesis main body and a left 14 x 12 extender limb with a 27 x 10 contralateral limb Coil embolization of the left internal iliac artery Introduction catheter into the left internal iliac artery third order catheter placement left common femoral artery approach ProGlide closure devices bilateral femoral arteries  PRE-OPERATIVE DIAGNOSIS: AAA  POST-OPERATIVE DIAGNOSIS: same  SURGEON: Hortencia Pilar, MD and [name], MD - Co-surgeons  ANESTHESIA: general  ESTIMATED BLOOD LOSS: 25 cc  FINDING(S): 1.  AAA  SPECIMEN(S):  none  INDICATIONS:   Blake Padilla is a 70 y.o. y.o. male who presents with very large bilateral iliac artery aneurysms.  Patient is undergoing aneurysm repair to prevent lethal rupture.  Risks and benefits of been reviewed the patient agrees to proceed.  DESCRIPTION: After obtaining full informed written consent, the patient was brought back to the operating room and placed supine upon the operating table.  The patient received IV antibiotics prior to induction.  After obtaining adequate anesthesia, the patient was prepped and draped in the standard fashion for endovascular AAA and aneurysmal iliac artery repair.  Co-surgeons are required because this is a complex bilateral procedure with work being performed simultaneously from both the right femoral and left femoral approach.  This also expedites the procedure making a shorter operative time reducing complications and improving patient safety.  We then began by gaining access to both femoral arteries with US guidance with me working on the patient's right and Dr. Lucky Cowboy working on the patient's left.  The femoral arteries were found to be patent and accessed without difficulty with a needle under ultrasound  guidance without difficulty on each side and permanent images were recorded.  We then placed 2 proglide devices on each side in a pre-close fashion and placed 8 French sheaths.  The patient was then given 6000 units of intravenous heparin.   Working on the left Dr. Lucky Cowboy performed a hand-injection to demonstrate the origin of the left internal iliac artery with myself assisting manipulating wires and holding tension as well as stabilizing catheters Dr. Lucky Cowboy was able to cannulate the left internal iliac artery with a C2 catheter.  A prograde catheter was then advanced more distally and hand-injection contrast verified the anatomy of the distal internal iliac artery on the left.  8 mm coils were selected a total of 3 were placed successfully occluding the internal iliac and allowing for extending beyond the iliac bifurcation and into the external iliac without retrograde filling of the iliac artery aneurysm.  Having successfully prepped for this we turned our attention back to aneurysm repair  The Pigtail catheter was placed into the aorta from the left side. Using this image, we selected a 26 x 14 x 16 Main body device.  Over a stiff wire, an 16 French sheath was placed. The main body was then placed through the 16 French sheath. A Kumpe catheter was placed up the right side and stiff wire was then advanced.  The 8 French sheath on the right was then upsized to a 16 [place] sheath as well. The main body was then deployed just below the lowest renal artery. The Kumpe catheter was used to cannulate the contralateral gate without difficulty and successful cannulation was confirmed by twirling the pigtail catheter in the main body. We then placed a  stiff wire and a retrograde arteriogram was performed through the right femoral sheath.  A 27 x 10 limb was selected for the right side and advanced into position.  It was then deployed without difficulty deployed. The main body deployment was then completed. Based off the  angiographic findings, extension limbs were necessary on the left as described above.  A 14 x 12 extender limb was selected and then advanced bridging the main body to the external iliac artery on the left. All junction points and seals zones were treated with the compliant balloon.   The pigtail catheter was then replaced and a completion angiogram was performed.   No endoleak was detected on completion angiography. The renal arteries were found to be widely patent.    At this point we elected to terminate the procedure. We secured the pro glide devices for hemostasis on the femoral arteries. The skin incision was closed with a 4-0 Monocryl. Dermabond and pressure dressing were placed. The patient was taken to the recovery room in stable condition having tolerated the procedure well.  COMPLICATIONS: none  CONDITION: stable  Hortencia Pilar  01/16/2021, 12:39 PM

## 2021-01-16 NOTE — Progress Notes (Signed)
OOB to chair with standby assistance. Bilateral groins stable.

## 2021-01-16 NOTE — Anesthesia Postprocedure Evaluation (Signed)
Anesthesia Post Note  Patient: Blake Padilla  Procedure(s) Performed: ENDOVASCULAR REPAIR/STENT GRAFT  Patient location during evaluation: PACU Anesthesia Type: General Level of consciousness: awake and alert, oriented and patient cooperative Pain management: pain level controlled Vital Signs Assessment: post-procedure vital signs reviewed and stable Respiratory status: spontaneous breathing, nonlabored ventilation and respiratory function stable Cardiovascular status: blood pressure returned to baseline and stable Postop Assessment: adequate PO intake Anesthetic complications: no   No notable events documented.   Last Vitals:  Vitals:   01/16/21 1311 01/16/21 1316  BP:  (!) 150/91  Pulse: 77 74  Resp: 17 14  Temp:    SpO2: 99% 99%    Last Pain:  Vitals:   01/16/21 1316  PainSc: [place]

## 2021-01-16 NOTE — Transfer of Care (Signed)
Immediate Anesthesia Transfer of Care Note  Patient: Blake Padilla  Procedure(s) Performed: ENDOVASCULAR REPAIR/STENT GRAFT  Patient Location: PACU  Anesthesia Type:General  Level of Consciousness: sedated  Airway & Oxygen Therapy: Patient Spontanous Breathing and Patient connected to face mask oxygen  Post-op Assessment: Report given to RN and Post -op Vital signs reviewed and stable  Post vital signs: Reviewed  Last Vitals:  Vitals Value Taken Time  BP    Temp    Pulse    Resp    SpO2      Last Pain: There were no vitals filed for this visit.       Complications: No notable events documented.

## 2021-01-17 LAB — BASIC METABOLIC PANEL
Anion gap: 5 (ref 5–15)
BUN: 12 mg/dL (ref 8–23)
CO2: 25 mmol/L (ref 22–32)
Calcium: 8.7 mg/dL — ABNORMAL LOW (ref 8.9–10.3)
Chloride: 107 mmol/L (ref 98–111)
Creatinine, Ser: 0.87 mg/dL (ref 0.61–1.24)
GFR, Estimated: >60 mL/min (ref >60)
Glucose, Bld: 124 mg/dL — ABNORMAL HIGH (ref 70–99)
Potassium: 4.3 mmol/L (ref 3.5–5.1)
Sodium: 137 mmol/L (ref 135–145)

## 2021-01-17 LAB — CBC
HCT: 40 % (ref 39.0–52.0)
Hemoglobin: 13.2 g/dL (ref 13.0–17.0)
MCH: 29.3 pg (ref 26.0–34.0)
MCHC: 33 g/dL (ref 30.0–36.0)
MCV: 88.7 fL (ref 80.0–100.0)
Platelets: 144 10*3/uL — ABNORMAL LOW (ref 150–400)
RBC: 4.51 MIL/uL (ref 4.22–5.81)
RDW: 12.9 % (ref 11.5–15.5)
WBC: 13.3 10*3/uL — ABNORMAL HIGH (ref 4.0–10.5)
nRBC: 0 % (ref 0.0–0.2)

## 2021-01-17 LAB — MAGNESIUM: Magnesium: 2 mg/dL (ref 1.7–2.4)

## 2021-01-17 MED ORDER — CHLORHEXIDINE GLUCONATE CLOTH 2 % EX PADS: 6.0000 | MEDICATED_PAD | Freq: Every day | CUTANEOUS | Status: DC

## 2021-01-17 MED ORDER — ASPIRIN 81 MG PO TBEC: 81.0000 mg | DELAYED_RELEASE_TABLET | Freq: Every day | ORAL | 11 refills | Status: AC

## 2021-01-17 MED ORDER — OXYCODONE HCL 5 MG PO TABS: 5.0000 mg | ORAL_TABLET | Freq: Three times a day (TID) | ORAL | 0 refills | Status: DC | PRN

## 2021-01-17 MED ORDER — PRAVASTATIN SODIUM 20 MG PO TABS: 20.0000 mg | ORAL_TABLET | Freq: Every evening | ORAL | 3 refills | Status: DC

## 2021-01-17 NOTE — Discharge Summary (Signed)
Physician Discharge Summary  Patient ID: DEVANSH RIESE MRN: [id number] DOB/AGE: 1950-08-21 70 y.o.  Admit date: 01/16/2021 Discharge date: 01/17/2021  Admission Diagnoses:  Discharge Diagnoses:  Principal Problem:   Iliac aneurysm [facility])   Discharged Condition: good  Hospital Course: 70 yo admitted after endo iliac aneurysm.  He had no issues overnight  Consults: None    Discharge Exam: Blood pressure 130/87, pulse 85, temperature 98.3 F (36.8 C), temperature source Oral, resp. rate 19, height 6\' 3"  (1.905 m), weight 99.3 kg, SpO2 94 %. Grois ok  Disposition:       Signed: Wells Kristene Liberati 01/17/2021, 12:23 PM

## 2021-01-17 NOTE — Plan of Care (Signed)
  Problem: Education: Goal: Knowledge of General Education information will improve Description: Including pain rating scale, medication(s)/side effects and non-pharmacologic comfort measures Outcome: Adequate for Discharge   Problem: Clinical Measurements: Goal: Ability to maintain clinical measurements within normal limits will improve Outcome: Adequate for Discharge Goal: Will remain free from infection Outcome: Adequate for Discharge Goal: Diagnostic test results will improve Outcome: Adequate for Discharge Goal: Cardiovascular complication will be avoided Outcome: Adequate for Discharge

## 2021-01-17 NOTE — Plan of Care (Signed)
Patient is S/P bilateral iliac artery aneurysms.  Hoping to discharge today.

## 2021-01-18 ENCOUNTER — Encounter: Payer: Self-pay | Admitting: Vascular Surgery

## 2021-01-25 DIAGNOSIS — Z95828 Presence of other vascular implants and grafts: Secondary | ICD-10-CM | POA: Insufficient documentation

## 2021-02-07 ENCOUNTER — Other Ambulatory Visit: Payer: Self-pay

## 2021-02-07 MED ORDER — EZETIMIBE 10 MG PO TABS: 10.0000 mg | ORAL_TABLET | Freq: Every day | ORAL | 3 refills | Status: DC

## 2021-02-07 NOTE — Telephone Encounter (Signed)
Patient is not sure if Dr. Garen Lah wants him to refill his Zetia. He has an appointment on 03/25/21, and not sure if he should wait until that appointment. Please call and advise.

## 2021-02-07 NOTE — Telephone Encounter (Signed)
Called patient back and informed him that Dr. Garen Lah does want him to continue taking the Zetia. I sent him in refills. Patient was grateful for the follow up.

## 2021-03-21 ENCOUNTER — Other Ambulatory Visit (INDEPENDENT_AMBULATORY_CARE_PROVIDER_SITE_OTHER): Payer: Self-pay | Admitting: Vascular Surgery

## 2021-03-21 DIAGNOSIS — Z9889 Other specified postprocedural states: Secondary | ICD-10-CM

## 2021-03-21 DIAGNOSIS — Z8679 Personal history of other diseases of the circulatory system: Secondary | ICD-10-CM

## 2021-03-22 ENCOUNTER — Encounter (INDEPENDENT_AMBULATORY_CARE_PROVIDER_SITE_OTHER): Payer: Self-pay | Admitting: Vascular Surgery

## 2021-03-22 ENCOUNTER — Ambulatory Visit (INDEPENDENT_AMBULATORY_CARE_PROVIDER_SITE_OTHER): Payer: Medicare HMO

## 2021-03-22 ENCOUNTER — Other Ambulatory Visit (INDEPENDENT_AMBULATORY_CARE_PROVIDER_SITE_OTHER): Payer: Self-pay | Admitting: Vascular Surgery

## 2021-03-22 ENCOUNTER — Ambulatory Visit (INDEPENDENT_AMBULATORY_CARE_PROVIDER_SITE_OTHER): Payer: Medicare HMO | Admitting: Vascular Surgery

## 2021-03-22 VITALS — BP 155/81 | HR 63 | Resp 16 | Wt 220.8 lb

## 2021-03-22 DIAGNOSIS — E785 Hyperlipidemia, unspecified: Secondary | ICD-10-CM

## 2021-03-22 DIAGNOSIS — Z9889 Other specified postprocedural states: Secondary | ICD-10-CM | POA: Diagnosis not present

## 2021-03-22 DIAGNOSIS — I739 Peripheral vascular disease, unspecified: Secondary | ICD-10-CM

## 2021-03-22 DIAGNOSIS — Z8679 Personal history of other diseases of the circulatory system: Secondary | ICD-10-CM

## 2021-03-22 DIAGNOSIS — I723 Aneurysm of iliac artery: Secondary | ICD-10-CM

## 2021-03-22 NOTE — Assessment & Plan Note (Signed)
Duplex today shows decrease in the left iliac artery down to about 2.4 cm in maximal diameter.  The distal aorta was close to 4 cm.  ABIs were normal with multiphasic waveforms distally consistent with no arterial insufficiency.  His left leg pain sounds like sciatica.  We discussed anti-inflammatories, stretching, and heat to the area.  We will see him back in 6 months with duplex.

## 2021-03-22 NOTE — Progress Notes (Signed)
° ° °  Patient ID: Blake Padilla, male   DOB: March 30, 1950, 71 y.o.   MRN: [id number]  Chief Complaint  Patient presents with   Follow-up    Follow up for pain medication and AAA surgery follow up    HPI Blake Padilla is a 71 y.o. male.  Patient returns about 2 months status post endovascular repair of a left iliac artery aneurysm.  This was roughly 4 cm.  He has had what sounds like sciatica over the past several weeks that is quite irritating.  No real claudication.  This is involving when he gets up from sitting or laying down having pain shoot down his leg.  He denies open wounds or infection. Duplex today shows decrease in the left iliac artery down to about 2.4 cm in maximal diameter.  The distal aorta was close to 4 cm.  ABIs were normal with multiphasic waveforms distally consistent with no arterial insufficiency.   Past Medical History:  Diagnosis Date   COPD (chronic obstructive pulmonary disease) ([place])    Coronary artery disease     Past Surgical History:  Procedure Laterality Date   ENDOVASCULAR REPAIR/STENT GRAFT N/A 01/16/2021   Procedure: ENDOVASCULAR REPAIR/STENT GRAFT;  Surgeon: Algernon Huxley, MD;  Location: [facility];  Service: Cardiovascular;  Laterality: N/A;   NO PAST SURGERIES        No Known Allergies  Current Outpatient Medications  Medication Sig Dispense Refill   aspirin EC 81 MG EC tablet Take 1 tablet (81 mg total) by mouth daily. Swallow whole. 30 tablet 11   ezetimibe (ZETIA) 10 MG tablet Take 1 tablet (10 mg total) by mouth daily. 90 tablet 3   oxyCODONE (ROXICODONE) 5 MG immediate release tablet Take 1 tablet (5 mg total) by mouth every 8 (eight) hours as needed. 10 tablet 0   pravastatin (PRAVACHOL) 20 MG tablet Take 1 tablet (20 mg total) by mouth every evening. 1 tablet 3   No current facility-administered medications for this visit.        Physical Exam BP (!) 155/81 (BP Location: Left Arm)    Pulse 63    Resp 16    Wt 220  lb 12.8 oz (100.2 kg)    BMI 27.60 kg/m  Gen:  WD/WN, NAD Skin: incision C/D/I     Assessment/Plan:  Iliac artery aneurysm (HCC) Duplex today shows decrease in the left iliac artery down to about 2.4 cm in maximal diameter.  The distal aorta was close to 4 cm.  ABIs were normal with multiphasic waveforms distally consistent with no arterial insufficiency.  His left leg pain sounds like sciatica.  We discussed anti-inflammatories, stretching, and heat to the area.  We will see him back in 6 months with duplex.  Hyperlipidemia lipid control important in reducing the progression of atherosclerotic disease. Continue statin therapy      [name] 03/22/2021, 10:08 AM   This note was created with Dragon medical transcription system.  Any errors from dictation are unintentional.

## 2021-03-22 NOTE — Assessment & Plan Note (Signed)
lipid control important in reducing the progression of atherosclerotic disease. Continue statin therapy  

## 2021-03-25 ENCOUNTER — Other Ambulatory Visit: Payer: Self-pay

## 2021-03-25 ENCOUNTER — Ambulatory Visit: Payer: Medicare HMO | Admitting: Cardiology

## 2021-03-25 ENCOUNTER — Encounter: Payer: Self-pay | Admitting: Cardiology

## 2021-03-25 VITALS — BP 170/90 | HR 79 | Ht 75.0 in | Wt 221.0 lb

## 2021-03-25 DIAGNOSIS — I10 Essential (primary) hypertension: Secondary | ICD-10-CM

## 2021-03-25 DIAGNOSIS — F17200 Nicotine dependence, unspecified, uncomplicated: Secondary | ICD-10-CM | POA: Diagnosis not present

## 2021-03-25 DIAGNOSIS — I2510 Atherosclerotic heart disease of native coronary artery without angina pectoris: Secondary | ICD-10-CM | POA: Diagnosis not present

## 2021-03-25 DIAGNOSIS — E785 Hyperlipidemia, unspecified: Secondary | ICD-10-CM | POA: Diagnosis not present

## 2021-03-25 DIAGNOSIS — I723 Aneurysm of iliac artery: Secondary | ICD-10-CM

## 2021-03-25 MED ORDER — AMLODIPINE BESYLATE 5 MG PO TABS: 5.0000 mg | ORAL_TABLET | Freq: Every day | ORAL | 3 refills | Status: AC

## 2021-03-25 NOTE — Patient Instructions (Signed)
Medication Instructions:   Your physician has recommended you make the following change in your medication:   START taking Amlodipine (Norvasc) 5 MG once a day.   *If you need a refill on your cardiac medications before your next appointment, please call your pharmacy*   Lab Work:  Fasting Lipid drawn today.  If you have labs (blood work) drawn today and your tests are completely normal, you will receive your results only by: [place] (if you have MyChart) OR A paper copy in the mail If you have any lab test that is abnormal or we need to change your treatment, we will call you to review the results.   Testing/Procedures: None ordered   Follow-Up: At [facility], you and your health needs are our priority.  As part of our continuing mission to provide you with exceptional heart care, we have created designated Provider Care Teams.  These Care Teams include your primary Cardiologist (physician) and Advanced Practice Providers (APPs -  Physician Assistants and Nurse Practitioners) who all work together to provide you with the care you need, when you need it.  We recommend signing up for the patient portal called "MyChart".  Sign up information is provided on this After Visit Summary.  MyChart is used to connect with patients for Virtual Visits (Telemedicine).  Patients are able to view lab/test results, encounter notes, upcoming appointments, etc.  Non-urgent messages can be sent to your provider as well.   To learn more about what you can do with MyChart, go to [email or web address].    Your next appointment:   Keep follow up appointment in March   The format for your next appointment:   In Person  Provider:   You may see Kate Sable, MD or one of the following Advanced Practice Providers on your designated Care Team:   Murray Hodgkins, NP Christell Faith, PA-C Cadence Kathlen Mody, [place]    Other Instructions

## 2021-03-25 NOTE — Progress Notes (Signed)
Cardiology Office Note:    Date:  03/25/2021   ID:  Blake Padilla, DOB [birth year], MRN [id number]  PCP:  Marguerita Merles, MD  [facility] Cardiologist:  Kate Sable, MD  [facility] Electrophysiologist:  None   Referring MD: Marguerita Merles, MD   Chief Complaint  Patient presents with   Follow-up    History of Present Illness:    Blake Padilla is a 71 y.o. 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    Being seen for CAD and hyperlipidemia.  Lipitor was started to control LDL .  Recently admitted after endovascular repair for iliac artery aneurysm.  History of muscle aches with Lipitor, Pravachol started, tolerating.  He still smokes, is working on quitting.  He checks blood pressure frequently at home, systolics in the 102H to 852D.  Denies chest pain.  Prior notes Echocardiogram 03/2020 EF 60 to 65%, impaired relaxation. Muscle aches with Lipitor, tolerating Pravachol.  Past Medical History:  Diagnosis Date   COPD (chronic obstructive pulmonary disease) ([place])    Coronary artery disease     Past Surgical History:  Procedure Laterality Date   ENDOVASCULAR REPAIR/STENT GRAFT N/A 01/16/2021   Procedure: ENDOVASCULAR REPAIR/STENT GRAFT;  Surgeon: Algernon Huxley, MD;  Location: [facility];  Service: Cardiovascular;  Laterality: N/A;   NO PAST SURGERIES      Current Medications: Current Meds  Medication Sig   amLODipine (NORVASC) 5 MG tablet Take 1 tablet (5 mg total) by mouth daily.   aspirin EC 81 MG EC tablet Take 1 tablet (81 mg total) by mouth daily. Swallow whole.   ezetimibe (ZETIA) 10 MG tablet Take 1 tablet (10 mg total) by mouth daily.   pravastatin (PRAVACHOL) 20 MG tablet Take 1 tablet (20 mg total) by mouth every evening.     Allergies:   Patient has no known allergies.   Social History    Socioeconomic History   Marital status: Married    Spouse name: Not on file   Number of children: Not on file   Years of education: Not on file   Highest education level: Not on file  Occupational History   Not on file  Tobacco Use   Smoking status: Every Day    Packs/day: 0.50    Years: 47.00    Pack years: 23.50    Types: Cigarettes   Smokeless tobacco: Never  Vaping Use   Vaping Use: Never used  Substance and Sexual Activity   Alcohol use: Not Currently    Comment: with colds   Drug use: Never   Sexual activity: Not on file  Other Topics Concern   Not on file  Social History Narrative   Not on file   Social Determinants of Health   Financial Resource Strain: Not on file  Food Insecurity: Not on file  Transportation Needs: Not on file  Physical Activity: Not on file  Stress: Not on file  Social Connections: Not on file     Family History: The patient's family history includes COPD in his sister; Dementia in his sister; Diabetes in his brother, father, and mother; Healthy in his sister; Hypertension in his father and mother; Leukemia in his sister.  ROS:   Please see the history of present illness.     All other systems reviewed and are negative.  EKGs/Labs/Other Studies Reviewed:  The following studies were reviewed today:   EKG:  EKG not ordered today.   Recent Labs: 01/17/2021: BUN 12; Creatinine, Ser 0.87; Hemoglobin 13.2; Magnesium 2.0; Platelets 144; Potassium 4.3; Sodium 137  Recent Lipid Panel    Component Value Date/Time   CHOL 155 10/26/2020 0915   TRIG 61 10/26/2020 0915   HDL 39 (L) 10/26/2020 0915   CHOLHDL 4.0 10/26/2020 0915   LDLCALC 104 (H) 10/26/2020 0915     Risk Assessment/Calculations:      Physical Exam:    VS:  BP (!) 170/90 (BP Location: Left Arm, Patient Position: Sitting, Cuff Size: Large)    Pulse 79    Ht 6\' 3"  (1.905 m)    Wt 221 lb (100.2 kg)    SpO2 98%    BMI 27.62 kg/m     Wt Readings from Last 3  Encounters:  03/25/21 221 lb (100.2 kg)  03/22/21 220 lb 12.8 oz (100.2 kg)  01/16/21 218 lb 14.7 oz (99.3 kg)     GEN:  Well nourished, well developed in no acute distress HEENT: Normal NECK: No JVD; No carotid bruits LYMPHATICS: No lymphadenopathy CARDIAC: RRR, no murmurs, rubs, gallops RESPIRATORY: Decreased breath sounds, otherwise clear  rhonchi  ABDOMEN: Soft, non-tender, non-distended MUSCULOSKELETAL:  No edema; No deformity  SKIN: Warm and dry NEUROLOGIC:  Alert and oriented x 3 PSYCHIATRIC:  Normal affect   ASSESSMENT:    1. Coronary artery disease involving native coronary artery of native heart without angina pectoris   2. Primary hypertension   3. Hyperlipidemia LDL goal <70   4. Smoking   5. Iliac artery aneurysm (HCC)      PLAN:    In order of problems listed above:  Three-vessel coronary artery calcifications on chest CT, asymptomatic.  Continue aspirin 81 mg, continue Pravachol 20 mg daily, Zetia.  History of muscle aches with Lipitor.  Echo 03/2020 showed normal EF.   Hypertension, start Norvasc 5 mg daily.  Low-salt diet advised Hyperlipidemia, LDL goal less than 70.  Continue Zetia, Pravachol 20 mg daily.  Obtain fasting lipid profile today Patient is a current smoker, cessation again advised. Bilateral iliac artery aneurysm s/p endovascular repair.  Follows up with vascular surgery.  Follow-up in 2 months.     Medication Adjustments/Labs and Tests Ordered: Current medicines are reviewed at length with the patient today.  Concerns regarding medicines are outlined above.  Orders Placed This Encounter  Procedures   Lipid panel    Meds ordered this encounter  Medications   amLODipine (NORVASC) 5 MG tablet    Sig: Take 1 tablet (5 mg total) by mouth daily.    Dispense:  30 tablet    Refill:  3     Patient Instructions  Medication Instructions:   Your physician has recommended you make the following change in your medication:   START taking  Amlodipine (Norvasc) 5 MG once a day.   *If you need a refill on your cardiac medications before your next appointment, please call your pharmacy*   Lab Work:  Fasting Lipid drawn today.  If you have labs (blood work) drawn today and your tests are completely normal, you will receive your results only by: [place] (if you have MyChart) OR A paper copy in the mail If you have any lab test that is abnormal or we need to change your treatment, we will call you to review the results.   Testing/Procedures: None ordered   Follow-Up: At [facility], you and  your health needs are our priority.  As part of our continuing mission to provide you with exceptional heart care, we have created designated Provider Care Teams.  These Care Teams include your primary Cardiologist (physician) and Advanced Practice Providers (APPs -  Physician Assistants and Nurse Practitioners) who all work together to provide you with the care you need, when you need it.  We recommend signing up for the patient portal called "MyChart".  Sign up information is provided on this After Visit Summary.  MyChart is used to connect with patients for Virtual Visits (Telemedicine).  Patients are able to view lab/test results, encounter notes, upcoming appointments, etc.  Non-urgent messages can be sent to your provider as well.   To learn more about what you can do with MyChart, go to [email or web address].    Your next appointment:   Keep follow up appointment in March   The format for your next appointment:   In Person  Provider:   You may see Kate Sable, MD or one of the following Advanced Practice Providers on your designated Care Team:   Murray Hodgkins, NP Christell Faith, PA-C Cadence Kathlen Mody, [place]    Other Instructions     Signed, Kate Sable, MD  03/25/2021 9:59 AM    [place]

## 2021-03-26 DIAGNOSIS — M5432 Sciatica, left side: Secondary | ICD-10-CM | POA: Insufficient documentation

## 2021-03-26 DIAGNOSIS — F411 Generalized anxiety disorder: Secondary | ICD-10-CM | POA: Insufficient documentation

## 2021-03-26 LAB — LIPID PANEL
Chol/HDL Ratio: 2.9 ratio (ref 0.0–5.0)
Cholesterol, Total: 124 mg/dL (ref 100–199)
HDL: 43 mg/dL (ref >39)
LDL Chol Calc (NIH): 67 mg/dL (ref 0–99)
Triglycerides: 66 mg/dL (ref 0–149)
VLDL Cholesterol Cal: 14 mg/dL (ref 5–40)

## 2021-04-23 DIAGNOSIS — M25569 Pain in unspecified knee: Secondary | ICD-10-CM | POA: Insufficient documentation

## 2021-04-25 ENCOUNTER — Other Ambulatory Visit: Payer: Self-pay

## 2021-04-25 ENCOUNTER — Encounter: Payer: Self-pay | Admitting: Cardiology

## 2021-04-25 ENCOUNTER — Ambulatory Visit: Payer: Medicare HMO | Admitting: Cardiology

## 2021-04-25 VITALS — BP 144/82 | HR 75 | Ht 75.0 in | Wt 220.0 lb

## 2021-04-25 DIAGNOSIS — I10 Essential (primary) hypertension: Secondary | ICD-10-CM | POA: Diagnosis not present

## 2021-04-25 DIAGNOSIS — E785 Hyperlipidemia, unspecified: Secondary | ICD-10-CM

## 2021-04-25 DIAGNOSIS — I2510 Atherosclerotic heart disease of native coronary artery without angina pectoris: Secondary | ICD-10-CM

## 2021-04-25 DIAGNOSIS — F17200 Nicotine dependence, unspecified, uncomplicated: Secondary | ICD-10-CM | POA: Diagnosis not present

## 2021-04-25 NOTE — Patient Instructions (Signed)
Medication Instructions:  ?Your physician recommends that you continue on your current medications as directed. Please refer to the Current Medication list given to you today. ? ?*If you need a refill on your cardiac medications before your next appointment, please call your pharmacy* ? ? ?Lab Work: ?None ordered ?If you have labs (blood work) drawn today and your tests are completely normal, you will receive your results only by: ?MyChart Message (if you have MyChart) OR ?A paper copy in the mail ?If you have any lab test that is abnormal or we need to change your treatment, we will call you to review the results. ? ? ?Testing/Procedures: ?None ordered ? ? ?Follow-Up: ?At [facility], you and your health needs are our priority.  As part of our continuing mission to provide you with exceptional heart care, we have created designated Provider Care Teams.  These Care Teams include your primary Cardiologist (physician) and Advanced Practice Providers (APPs -  Physician Assistants and Nurse Practitioners) who all work together to provide you with the care you need, when you need it. ? ?We recommend signing up for the patient portal called "MyChart".  Sign up information is provided on this After Visit Summary.  MyChart is used to connect with patients for Virtual Visits (Telemedicine).  Patients are able to view lab/test results, encounter notes, upcoming appointments, etc.  Non-urgent messages can be sent to your provider as well.   ?To learn more about what you can do with MyChart, go to [email or web address].   ? ?Your next appointment:   ?6 month(s) ? ?The format for your next appointment:   ?In Person ? ?Provider:   ?You may see Kate Sable, MD or one of the following Advanced Practice Providers on your designated Care Team:   ?Murray Hodgkins, NP ?Christell Faith, PA-C ?Cadence Kathlen Mody, PA-C  ? ? ?Other Instructions ? ? ?

## 2021-04-25 NOTE — Progress Notes (Signed)
?Cardiology Office Note:   ? ?Date:  04/25/2021  ? ?ID:  Blake Padilla, DOB [birth year], MRN [id number] ? ?PCP:  Marguerita Merles, MD  ?[facility] Cardiologist:  Kate Sable, MD  ?[facility] Electrophysiologist:  None  ? ?Referring MD: Marguerita Merles, MD  ? ?Chief Complaint  ?Patient presents with  ? Other  ?  3 month follow up -- Meds reviewed verbally with patient.   ? ? ?History of Present Illness:   ? ?Blake Padilla is a 71 y.o. 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   ? ?Being seen for CAD, hypertension and hyperlipidemia.  BP was elevated at last visit, Norvasc 5 mg daily was started.  His blood pressures have improved, he tolerates amlodipine without any adverse effects.  Systolic blood pressures at home usually in the 130s.  He has left knee pain which is chronic.  He thinks this might attribute his occasional spikes in blood pressure to the 140s.  He is seeing an orthopedic specialist either later this week or next week.  He otherwise has no other concerns.  Denies chest pain or shortness of breath. ? ?Prior notes ?Echocardiogram 03/2020 EF 60 to 65%, impaired relaxation. ?Muscle aches with Lipitor, tolerating Pravachol. ? ?Past Medical History:  ?Diagnosis Date  ? COPD (chronic obstructive pulmonary disease) ([place])   ? Coronary artery disease   ? ? ?Past Surgical History:  ?Procedure Laterality Date  ? ENDOVASCULAR REPAIR/STENT GRAFT N/A 01/16/2021  ? Procedure: ENDOVASCULAR REPAIR/STENT GRAFT;  Surgeon: Algernon Huxley, MD;  Location: [place] [facility];  Service: Cardiovascular;  Laterality: N/A;  ? NO PAST SURGERIES    ? ? ?Current Medications: ?Current Meds  ?Medication Sig  ? amLODipine (NORVASC) 5 MG tablet Take 1 tablet (5 mg total) by mouth daily.  ? aspirin EC 81 MG EC tablet Take 1 tablet (81 mg total) by mouth daily. Swallow  whole.  ? ezetimibe (ZETIA) 10 MG tablet Take 1 tablet (10 mg total) by mouth daily.  ? pravastatin (PRAVACHOL) 20 MG tablet Take 1 tablet (20 mg total) by mouth every evening.  ?  ? ?Allergies:   Patient has no known allergies.  ? ?Social History  ? ?Socioeconomic History  ? Marital status: Married  ?  Spouse name: Not on file  ? Number of children: Not on file  ? Years of education: Not on file  ? Highest education level: Not on file  ?Occupational History  ? Not on file  ?Tobacco Use  ? Smoking status: Every Day  ?  Packs/day: 0.50  ?  Years: 47.00  ?  Pack years: 23.50  ?  Types: Cigarettes  ? Smokeless tobacco: Never  ?Vaping Use  ? Vaping Use: Never used  ?Substance and Sexual Activity  ? Alcohol use: Not Currently  ?  Comment: with colds  ? Drug use: Never  ? Sexual activity: Not on file  ?Other Topics Concern  ? Not on file  ?Social History Narrative  ? Not on file  ? ?Social Determinants of Health  ? ?Financial Resource Strain: Not on file  ?Food Insecurity: Not on file  ?Transportation Needs: Not on file  ?Physical Activity: Not on file  ?Stress: Not on file  ?Social Connections: Not on file  ?  ? ?Family History: ?The patient's family history includes COPD in his sister; Dementia in  his sister; Diabetes in his brother, father, and mother; Healthy in his sister; Hypertension in his father and mother; Leukemia in his sister. ? ?ROS:   ?Please see the history of present illness.    ? All other systems reviewed and are negative. ? ?EKGs/Labs/Other Studies Reviewed:   ? ?The following studies were reviewed today: ? ? ?EKG:  EKG is ordered today.  EKG shows normal sinus rhythm, normal ECG ? ?Recent Labs: ?01/17/2021: BUN 12; Creatinine, Ser 0.87; Hemoglobin 13.2; Magnesium 2.0; Platelets 144; Potassium 4.3; Sodium 137  ?Recent Lipid Panel ?   ?Component Value Date/Time  ? CHOL 124 03/25/2021 0838  ? TRIG 66 03/25/2021 0838  ? HDL 43 03/25/2021 0838  ? CHOLHDL 2.9 03/25/2021 0838  ? [place] 67 03/25/2021 0838   ? ? ? ?Risk Assessment/Calculations:   ? ? ? ?Physical Exam:   ? ?VS:  BP (!) 144/82 (BP Location: Left Arm, Patient Position: Sitting, Cuff Size: Normal)   Pulse 75   Ht 6\' 3"  (1.905 m)   Wt 220 lb (99.8 kg)   SpO2 97%   BMI 27.50 kg/m?    ? ?Wt Readings from Last 3 Encounters:  ?04/25/21 220 lb (99.8 kg)  ?03/25/21 221 lb (100.2 kg)  ?03/22/21 220 lb 12.8 oz (100.2 kg)  ?  ? ?GEN:  Well nourished, well developed in no acute distress ?HEENT: Normal ?NECK: No JVD; No carotid bruits ?LYMPHATICS: No lymphadenopathy ?CARDIAC: RRR, no murmurs, rubs, gallops ?RESPIRATORY: Decreased breath sounds, otherwise clear  rhonchi  ?ABDOMEN: Soft, non-tender, non-distended ?MUSCULOSKELETAL:  No edema; No deformity  ?SKIN: Warm and dry ?NEUROLOGIC:  Alert and oriented x 3 ?PSYCHIATRIC:  Normal affect  ? ?ASSESSMENT:   ? ?1. Coronary artery disease involving native coronary artery of native heart without angina pectoris   ?2. Primary hypertension   ?3. Hyperlipidemia LDL goal <70   ?4. Smoking   ? ? ? ? ?PLAN:   ? ?In order of problems listed above: ? ?Three-vessel coronary artery calcifications on chest CT, denies chest pain or shortness of breath.  Continue aspirin 81 mg, continue Pravachol 20 mg daily, Zetia.  History of muscle aches with Lipitor.  Echo 03/2020 showed normal EF60-65%.   ?Hypertension, BP improving.  Continue Norvasc 5 mg daily.  Low-salt diet advised.  Consider titration if BP stays elevated at follow-up visit. ?Hyperlipidemia, cholesterol controlled, LDL at goal of less than 70.  Continue Zetia, Pravachol 20 mg daily.   ?Patient is a current smoker, cessation again advised. ? ? ?Follow-up in 6 months. ?  ? ? ?Medication Adjustments/Labs and Tests Ordered: ?Current medicines are reviewed at length with the patient today.  Concerns regarding medicines are outlined above.  ?Orders Placed This Encounter  ?Procedures  ? EKG 12-Lead  ? ? ?No orders of the defined types were placed in this  encounter. ? ? ? ?Patient Instructions  ?Medication Instructions:  ?Your physician recommends that you continue on your current medications as directed. Please refer to the Current Medication list given to you today. ? ?*If you need a refill on your cardiac medications before your next appointment, please call your pharmacy* ? ? ?Lab Work: ?None ordered ?If you have labs (blood work) drawn today and your tests are completely normal, you will receive your results only by: ?MyChart Message (if you have MyChart) OR ?A paper copy in the mail ?If you have any lab test that is abnormal or we need to change your treatment, we will call you to  review the results. ? ? ?Testing/Procedures: ?None ordered ? ? ?Follow-Up: ?At [facility], you and your health needs are our priority.  As part of our continuing mission to provide you with exceptional heart care, we have created designated Provider Care Teams.  These Care Teams include your primary Cardiologist (physician) and Advanced Practice Providers (APPs -  Physician Assistants and Nurse Practitioners) who all work together to provide you with the care you need, when you need it. ? ?We recommend signing up for the patient portal called "MyChart".  Sign up information is provided on this After Visit Summary.  MyChart is used to connect with patients for Virtual Visits (Telemedicine).  Patients are able to view lab/test results, encounter notes, upcoming appointments, etc.  Non-urgent messages can be sent to your provider as well.   ?To learn more about what you can do with MyChart, go to [email or web address].   ? ?Your next appointment:   ?6 month(s) ? ?The format for your next appointment:   ?In Person ? ?Provider:   ?You may see Kate Sable, MD or one of the following Advanced Practice Providers on your designated Care Team:   ?Murray Hodgkins, NP ?Christell Faith, PA-C ?Cadence Kathlen Mody, PA-C  ? ? ?Other Instructions ? ?  ? ?Signed, ?Kate Sable, MD  ?04/25/2021  12:27 PM    ?[place] ?

## 2021-05-07 ENCOUNTER — Other Ambulatory Visit: Payer: Self-pay | Admitting: *Deleted

## 2021-05-07 DIAGNOSIS — Z87891 Personal history of nicotine dependence: Secondary | ICD-10-CM

## 2021-05-07 DIAGNOSIS — F17210 Nicotine dependence, cigarettes, uncomplicated: Secondary | ICD-10-CM

## 2021-05-21 ENCOUNTER — Other Ambulatory Visit: Payer: Self-pay

## 2021-05-21 ENCOUNTER — Ambulatory Visit
Admission: RE | Admit: 2021-05-21 | Discharge: 2021-05-21 | Disposition: A | Discharge status: Home / Self Care | Payer: Medicare HMO | Source: Ambulatory Visit | Attending: Acute Care | Admitting: Acute Care

## 2021-05-21 DIAGNOSIS — I700 Atherosclerosis of aorta: Secondary | ICD-10-CM | POA: Insufficient documentation

## 2021-05-21 DIAGNOSIS — F17210 Nicotine dependence, cigarettes, uncomplicated: Secondary | ICD-10-CM | POA: Insufficient documentation

## 2021-05-21 DIAGNOSIS — I2510 Atherosclerotic heart disease of native coronary artery without angina pectoris: Secondary | ICD-10-CM | POA: Insufficient documentation

## 2021-05-21 DIAGNOSIS — D3502 Benign neoplasm of left adrenal gland: Secondary | ICD-10-CM | POA: Insufficient documentation

## 2021-05-21 DIAGNOSIS — Z87891 Personal history of nicotine dependence: Secondary | ICD-10-CM

## 2021-05-21 DIAGNOSIS — J439 Emphysema, unspecified: Secondary | ICD-10-CM | POA: Insufficient documentation

## 2021-05-21 DIAGNOSIS — Z122 Encounter for screening for malignant neoplasm of respiratory organs: Secondary | ICD-10-CM | POA: Insufficient documentation

## 2021-05-23 ENCOUNTER — Other Ambulatory Visit: Payer: Self-pay

## 2021-05-23 DIAGNOSIS — Z87891 Personal history of nicotine dependence: Secondary | ICD-10-CM

## 2021-05-23 DIAGNOSIS — F17210 Nicotine dependence, cigarettes, uncomplicated: Secondary | ICD-10-CM

## 2021-05-24 ENCOUNTER — Other Ambulatory Visit: Payer: Self-pay | Admitting: Orthopedic Surgery

## 2021-05-31 ENCOUNTER — Encounter
Admission: RE | Admit: 2021-05-31 | Discharge: 2021-05-31 | Disposition: A | Discharge status: Home / Self Care | Payer: Medicare HMO | Source: Ambulatory Visit | Attending: Orthopedic Surgery | Admitting: Orthopedic Surgery

## 2021-05-31 VITALS — BP 139/72 | HR 73 | Resp 16 | Ht 75.0 in | Wt 216.5 lb

## 2021-05-31 DIAGNOSIS — Z01812 Encounter for preprocedural laboratory examination: Secondary | ICD-10-CM | POA: Insufficient documentation

## 2021-05-31 HISTORY — DX: Unspecified osteoarthritis, unspecified site: M19.90

## 2021-05-31 HISTORY — DX: Panic disorder (episodic paroxysmal anxiety): F41.0

## 2021-05-31 HISTORY — DX: Essential (primary) hypertension: I10

## 2021-05-31 HISTORY — DX: Anxiety disorder, unspecified: F41.9

## 2021-05-31 LAB — URINALYSIS, ROUTINE W REFLEX MICROSCOPIC
Bilirubin Urine: NEGATIVE
Glucose, UA: NEGATIVE mg/dL
Hgb urine dipstick: NEGATIVE
Ketones, ur: NEGATIVE mg/dL
Leukocytes,Ua: NEGATIVE
Nitrite: NEGATIVE
Protein, ur: NEGATIVE mg/dL
Specific Gravity, Urine: 1.023 (ref 1.005–1.030)
pH: 5 (ref 5.0–8.0)

## 2021-05-31 LAB — CBC WITH DIFFERENTIAL/PLATELET
Abs Immature Granulocytes: 0.01 10*3/uL (ref 0.00–0.07)
Basophils Absolute: 0 10*3/uL (ref 0.0–0.1)
Basophils Relative: 1 %
Eosinophils Absolute: 0.2 10*3/uL (ref 0.0–0.5)
Eosinophils Relative: 4 %
HCT: 44.7 % (ref 39.0–52.0)
Hemoglobin: 14.4 g/dL (ref 13.0–17.0)
Immature Granulocytes: 0 %
Lymphocytes Relative: 37 %
Lymphs Abs: 2.1 10*3/uL (ref 0.7–4.0)
MCH: 28.7 pg (ref 26.0–34.0)
MCHC: 32.2 g/dL (ref 30.0–36.0)
MCV: 89.2 fL (ref 80.0–100.0)
Monocytes Absolute: 0.7 10*3/uL (ref 0.1–1.0)
Monocytes Relative: 12 %
Neutro Abs: 2.6 10*3/uL (ref 1.7–7.7)
Neutrophils Relative %: 46 %
Platelets: 169 10*3/uL (ref 150–400)
RBC: 5.01 MIL/uL (ref 4.22–5.81)
RDW: 13.1 % (ref 11.5–15.5)
WBC: 5.6 10*3/uL (ref 4.0–10.5)
nRBC: 0 % (ref 0.0–0.2)

## 2021-05-31 LAB — COMPREHENSIVE METABOLIC PANEL
ALT: 12 U/L (ref 0–44)
AST: 14 U/L — ABNORMAL LOW (ref 15–41)
Albumin: 3.8 g/dL (ref 3.5–5.0)
Alkaline Phosphatase: 100 U/L (ref 38–126)
Anion gap: 10 (ref 5–15)
BUN: 16 mg/dL (ref 8–23)
CO2: 24 mmol/L (ref 22–32)
Calcium: 9 mg/dL (ref 8.9–10.3)
Chloride: 108 mmol/L (ref 98–111)
Creatinine, Ser: 1.02 mg/dL (ref 0.61–1.24)
GFR, Estimated: >60 mL/min (ref >60)
Glucose, Bld: 102 mg/dL — ABNORMAL HIGH (ref 70–99)
Potassium: 4.2 mmol/L (ref 3.5–5.1)
Sodium: 142 mmol/L (ref 135–145)
Total Bilirubin: 0.9 mg/dL (ref 0.3–1.2)
Total Protein: 7.3 g/dL (ref 6.5–8.1)

## 2021-05-31 LAB — TYPE AND SCREEN
ABO/RH(D): B POS
Antibody Screen: NEGATIVE

## 2021-05-31 LAB — SURGICAL PCR SCREEN
MRSA, PCR: NEGATIVE
Staphylococcus aureus: NEGATIVE

## 2021-05-31 NOTE — Pre-Procedure Instructions (Signed)
Kate Sable, MD  ?Physician ?Cardiology ?Progress Notes     ?Signed ?Encounter Date:  04/25/2021 ?  ?Signed    ?  ?Expand All Collapse All ?   ?   ?   ?   ?   ?   ?   ?   ?   ?   ?   ?   ?   ?   ?   ?   ?   ?   ?   ?   ?   ?   ?   ?   ?   ?   ?   ?   ?   ?   ?   ?   ?   ?   ?   ?   ?   ?   ?   ?   ?   ?   ?   ?   ?   ?   ?   ?   ?   ?   ?   ?   ?   ?   ?   ?   ?   ?   ?   ?   ?   ?   ?   ?   ?   ?   ?   ?   ?   ?   ?   ?   ?   ?   ?   ?   ?   ?   ?   ?   ?   ?   ?   ?   ?   ?   ?   ?   ?   ?   ?   ?   ?   ?   ?   ?   ?   ?   ?   ?   ?   ?   ?   ?   ?   ?   ?   ?   ?   ?   ?   ?   ?   ?   ?   ?   ?   ?   ?   ?   ?   ?   ?   ?   ?   ?   ?   ?   ?   ?   ?   ?   ?   ?   ?   ?   ?   ?   ?   ?   ?   ?   ?   ?   ?   ?   ?   ?   ?  ?Cardiology Office Note:   ?  ?Date:  04/25/2021  ?  ?ID:  LEWAYNE Padilla, DOB 31-Mar-1950, MRN [id number] ?  ?PCP:  Marguerita Merles, MD       ?[facility] Cardiologist:  Kate Sable, MD  ?[facility] Electrophysiologist:  None  ?  ?Referring MD: Marguerita Merles, MD  ?  ?    ?Chief Complaint  ?Patient presents with  ? Other  ?    3 month follow up -- Meds reviewed verbally with patient.   ?  ?  ?History of Present Illness:   ?  ?[name] is a 71 y.o. 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   ?  ?Being seen for CAD, hypertension and hyperlipidemia.  BP was elevated at last visit, Norvasc 5 mg daily was started.  His blood pressures have improved, he tolerates amlodipine without any adverse effects.  Systolic blood pressures at home usually in the 130s.  He has left knee pain which is chronic.  He thinks this might attribute his occasional spikes in blood pressure to the 140s.  He is seeing an orthopedic specialist either later this week or next week.  He otherwise has no other concerns.  Denies chest pain or shortness  of breath. ?  ?Prior notes ?Echocardiogram 03/2020 EF 60 to 65%, impaired relaxation. ?Muscle aches with Lipitor, tolerating Pravachol. ?  ?    ?Past Medical History:  ?Diagnosis Date  ? COPD (chronic obstructive pulmonary disease) ([place])    ? Coronary artery disease    ?  ?  ?     ?Past Surgical History:  ?Procedure Laterality Date  ? ENDOVASCULAR REPAIR/STENT GRAFT N/A 01/16/2021  ?  Procedure: ENDOVASCULAR REPAIR/STENT GRAFT;  Surgeon: Algernon Huxley, MD;  Location: [place] [facility];  Service: Cardiovascular;  Laterality: N/A;  ? NO PAST SURGERIES      ?  ?  ?Current Medications: ?Active Medications  ?    ?Current Meds  ?Medication Sig  ? amLODipine (NORVASC) 5 MG tablet Take 1 tablet (5 mg total) by mouth daily.  ? aspirin EC 81 MG EC tablet Take 1 tablet (81 mg total) by mouth daily. Swallow whole.  ? ezetimibe (ZETIA) 10 MG tablet Take 1 tablet (10 mg total) by mouth daily.  ? pravastatin (PRAVACHOL) 20 MG tablet Take 1 tablet (20 mg total) by mouth every evening.  ?  ?  ?  ?Allergies:   Patient has no known allergies.  ?  ?Social History  ?  ?     ?Socioeconomic History  ? Marital status: Married  ?    Spouse name: Not on file  ? Number of children: Not on file  ? Years of education: Not on file  ? Highest education level: Not on file  ?Occupational History  ? Not on file  ?Tobacco Use  ? Smoking status: Every Day  ?    Packs/day: 0.50  ?    Years: 47.00  ?    Pack years: 23.50  ?    Types: Cigarettes  ? Smokeless tobacco: Never  ?Vaping Use  ? Vaping Use: Never used  ?Substance and Sexual Activity  ? Alcohol use: Not Currently  ?    Comment: with colds  ? Drug use: Never  ? Sexual activity: Not on file  ?Other Topics Concern  ? Not on file  ?Social History Narrative  ? Not on file  ?  ?Social Determinants of Health  ?  ?Financial Resource Strain: Not on file  ?Food Insecurity: Not on file  ?Transportation Needs: Not on file  ?Physical Activity: Not on file  ?Stress: Not on file  ?Social Connections: Not on  file  ?  ?  ?Family History: ?The patient's family history includes COPD in his sister; Dementia in his sister; Diabetes in his brother, father, and mother; Healthy in his sister; Hypertension in his father and mother; Leukemia in his sister. ?  ?ROS:   ?Please see the history of present illness.    ? All other systems reviewed and are negative. ?  ?EKGs/Labs/Other Studies Reviewed:   ?  ?  The following studies were reviewed today: ?  ?  ?EKG:  EKG is ordered today.  EKG shows normal sinus rhythm, normal ECG ?  ?Recent Labs: ?01/17/2021: BUN 12; Creatinine, Ser 0.87; Hemoglobin 13.2; Magnesium 2.0; Platelets 144; Potassium 4.3; Sodium 137  ?Recent Lipid Panel ?Labs (Brief)  ?     ?   ?Component Value Date/Time  ?  CHOL 124 03/25/2021 0838  ?  TRIG 66 03/25/2021 0838  ?  HDL 43 03/25/2021 0838  ?  CHOLHDL 2.9 03/25/2021 0838  ?  [place] 67 03/25/2021 0838  ?  ?  ?  ?  ?Risk Assessment/Calculations:   ?  ?  ?  ?Physical Exam:   ?  ?VS:  BP (!) 144/82 (BP Location: Left Arm, Patient Position: Sitting, Cuff Size: Normal)   Pulse 75   Ht '6\' 3"'$  (1.905 m)   Wt 220 lb (99.8 kg)   SpO2 97%   BMI 27.50 kg/m?    ?  ?   ?Wt Readings from Last 3 Encounters:  ?04/25/21 220 lb (99.8 kg)  ?03/25/21 221 lb (100.2 kg)  ?03/22/21 220 lb 12.8 oz (100.2 kg)  ?  ?  ?GEN:  Well nourished, well developed in no acute distress ?HEENT: Normal ?NECK: No JVD; No carotid bruits ?LYMPHATICS: No lymphadenopathy ?CARDIAC: RRR, no murmurs, rubs, gallops ?RESPIRATORY: Decreased breath sounds, otherwise clear  rhonchi  ?ABDOMEN: Soft, non-tender, non-distended ?MUSCULOSKELETAL:  No edema; No deformity  ?SKIN: Warm and dry ?NEUROLOGIC:  Alert and oriented x 3 ?PSYCHIATRIC:  Normal affect  ?  ?ASSESSMENT:   ?  ?1. Coronary artery disease involving native coronary artery of native heart without angina pectoris   ?2. Primary hypertension   ?3. Hyperlipidemia LDL goal <70   ?4. Smoking   ?  ?  ?  ?  ?PLAN:   ?  ?In order of problems listed above: ?   ?Three-vessel coronary artery calcifications on chest CT, denies chest pain or shortness of breath.  Continue aspirin 81 mg, continue Pravachol 20 mg daily, Zetia.  History of muscle aches with Lipitor.  Echo 03/2020 showed normal EF60-65%.   ?Hypertension, BP improving.  Continue Norvasc 5 mg daily.  Low-salt diet advised.  Consider titration if BP stays elevated at follow-up visit. ?Hyperlipidemia, cholesterol controlled, LDL at goal of less than 70.  Continue Zetia, Pravachol 20 mg daily.   ?Patient is a current smoker, cessation again advised. ?  ?  ?Follow-up in 6 months. ?  ?  ?  ?Medication Adjustments/Labs and Tests Ordered: ?Current medicines are reviewed at length with the patient today.  Concerns regarding medicines are outlined above.  ?   ?Orders Placed This Encounter  ?Procedures  ? EKG 12-Lead  ?  ?  ?No orders of the defined types were placed in this encounter. ?  ?  ?  ?Patient Instructions  ?Medication Instructions:  ?Your physician recommends that you continue on your current medications as directed. Please refer to the Current Medication list given to you today. ?  ?*If you need a refill on your cardiac medications before your next appointment, please call your pharmacy* ?  ?  ?Lab Work: ?None ordered ?If you have labs (blood work) drawn today and your tests are completely normal, you will receive your results only by: ?MyChart Message (if you have MyChart) OR ?A paper copy in the mail ?If you have any lab test that is abnormal or we need to change your treatment, we will call you to  review the results. ?  ?  ?Testing/Procedures: ?None ordered ?  ?  ?Follow-Up: ?At [facility], you and your health needs are our priority.  As part of our continuing mission to provide you with exceptional heart care, we have created designated Provider Care Teams.  These Care Teams include your primary Cardiologist (physician) and Advanced Practice Providers (APPs -  Physician Assistants and Nurse Practitioners)  who all work together to provide you with the care you need, when you need it. ?  ?We recommend signing up for the patient portal called "MyChart".  Sign up information is provided on this After Visit Summary.

## 2021-05-31 NOTE — Pre-Procedure Instructions (Signed)
Algernon Huxley, MD  ?Physician ?Vascular Surgery ?Progress Notes     ?Signed ?Encounter Date:  03/22/2021 ?  ?Signed    ?  ?   ?   ?   ?   ?   ?   ?   ?   ?   ?   ?   ?   ?   ?   ?   ?   ?   ?   ?   ?   ?   ?   ?   ?   ?   ?   ?   ?   ?   ?   ?   ?   ?   ?   ?   ?   ?   ?   ?   ?   ?   ?   ?   ?   ?   ?   ?   ?   ?   ?   ?   ?   ?   ?   ?   ?   ?   ?   ?   ?   ?   ?   ?   ?   ?   ?   ?   ?   ?   ?   ?   ?   ?   ?   ?   ?   ?   ?   ?   ?   ?   ?   ?   ?   ?   ?   ?   ?   ?   ?   ?   ?   ?   ?   ?   ?   ?   ?   ? ?  ?  ?Patient ID: Blake Padilla, male   DOB: 09/29/1950, 71 y.o.   MRN: [id number] ?  ?    ?Chief Complaint  ?Patient presents with  ? Follow-up  ?    Follow up for pain medication and AAA surgery follow up  ?  ?  ?HPI ?Blake Padilla is a 71 y.o. male.  Patient returns about 2 months status post endovascular repair of a left iliac artery aneurysm.  This was roughly 4 cm.  He has had what sounds like sciatica over the past several weeks that is quite irritating.  No real claudication.  This is involving when he gets up from sitting or laying down having pain shoot down his leg.  He denies open wounds or infection. ?Duplex today shows decrease in the left iliac artery down to about 2.4 cm in maximal diameter.  The distal aorta was close to 4 cm.  ABIs were normal with multiphasic waveforms distally consistent with no arterial insufficiency. ?  ?  ?    ?Past Medical History:  ?Diagnosis Date  ? COPD (chronic obstructive pulmonary disease) ([place])    ? Coronary artery disease    ?  ?  ?     ?Past Surgical History:  ?Procedure Laterality Date  ? ENDOVASCULAR REPAIR/STENT GRAFT N/A 01/16/2021  ?  Procedure: ENDOVASCULAR REPAIR/STENT GRAFT;  Surgeon: Algernon Huxley, MD;  Location: [facility];  Service: Cardiovascular;  Laterality: N/A;  ? NO PAST SURGERIES      ?  ?  ?  ?  ?No Known Allergies ?  ?      ?  Current Outpatient Medications  ?Medication Sig Dispense Refill  ? aspirin EC 81 MG EC tablet Take 1  tablet (81 mg total) by mouth daily. Swallow whole. 30 tablet 11  ? ezetimibe (ZETIA) 10 MG tablet Take 1 tablet (10 mg total) by mouth daily. 90 tablet 3  ? oxyCODONE (ROXICODONE) 5 MG immediate release tablet Take 1 tablet (5 mg total) by mouth every 8 (eight) hours as needed. 10 tablet 0  ? pravastatin (PRAVACHOL) 20 MG tablet Take 1 tablet (20 mg total) by mouth every evening. 1 tablet 3  ?  ?No current facility-administered medications for this visit.  ?  ?  ?  ?  ?  ?  ?Physical Exam ?BP (!) 155/81 (BP Location: Left Arm)   Pulse 63   Resp 16   Wt 220 lb 12.8 oz (100.2 kg)   BMI 27.60 kg/m?  ?Gen:  WD/WN, NAD ?Skin: incision C/D/I ?  ?  ?  ?  ?Assessment/Plan: ?  ?Iliac artery aneurysm ([place]) ?Duplex today shows decrease in the left iliac artery down to about 2.4 cm in maximal diameter.  The distal aorta was close to 4 cm.  ABIs were normal with multiphasic waveforms distally consistent with no arterial insufficiency.  His left leg pain sounds like sciatica.  We discussed anti-inflammatories, stretching, and heat to the area.  We will see him back in 6 months with duplex. ?  ?Hyperlipidemia ?lipid control important in reducing the progression of atherosclerotic disease. Continue statin therapy ?  ?  ?  ?  ?  ?[name] ?03/22/2021, 10:08 AM ?  ?  ?This note was created with Dragon medical transcription system.  Any errors from dictation are unintentional.     ?  ?  ?  ?Electronically signed by Algernon Huxley, MD at 03/22/2021 10:08 AM ?Office Visit on 03/22/2021 ? ?Office Visit on 03/22/2021 ? ? ?Detailed Report ? ?Note shared with patient ?Additional Documentation ? ?Vitals:  BP 155/81 Important   (BP Location: Left Arm) ?Pulse 63 ?Resp 16 ?Wt 100.2 kg ?BMI 27.60 kg/m? ?BSA 2.3 m? ?Pain [place] 10-Worst pain ever ? ?More Vitals  ?Flowsheets:  Clinical Intake, ?NEWS, ?MEWS Score, ?Anthropometrics, ?MD PAD Screen Review, ?Method of Visit ?  ?Encounter Info:  Billing Info, ?History, ?Allergies, ?Detailed Report ?   ? ?Orders Placed ? ?VAS [place] EVAR DUPLEX ?Medication Changes ? ? ?None ?  ?Medication List  ? ?Visit Diagnoses ? ? ?Iliac artery aneurysm ([place]) ? ?Hyperlipidemia, unspecified hyperlipidemia type ?  ?Problem List  ? ?

## 2021-05-31 NOTE — Patient Instructions (Addendum)
Your procedure is scheduled on: 06/11/21 - Tuesday ?Report to the Registration Desk on the 1st floor of the [facility]. ?To find out your arrival time, please call [phone] between 1PM - 3PM on: 06/10/21 - Monday ? ?REMEMBER: ?Instructions that are not followed completely may result in serious medical risk, up to and including death; or upon the discretion of your surgeon and anesthesiologist your surgery may need to be rescheduled. ? ?Do not eat food after midnight the night before surgery.  ?No gum chewing, lozengers or hard candies. ? ?You may however, drink CLEAR liquids up to 2 hours before you are scheduled to arrive for your surgery. Do not drink anything within 2 hours of your scheduled arrival time. ? ?Clear liquids include: ?- water  ?- apple juice without pulp ?- gatorade (not RED colors) ?- black coffee or tea (Do NOT add milk or creamers to the coffee or tea) ?Do NOT drink anything that is not on this list. ? ?In addition, your doctor has ordered for you to drink the provided  ?Ensure Pre-Surgery Clear Carbohydrate Drink  ?Drinking this carbohydrate drink up to two hours before surgery helps to reduce insulin resistance and improve patient outcomes. Please complete drinking 2 hours prior to scheduled arrival time. ? ?TAKE THESE MEDICATIONS THE MORNING OF SURGERY WITH A SIP OF WATER: ? ?- amLODipine (NORVASC) 5 MG tablet ? ?Follow recommendations from Cardiologist, Pulmonologist or PCP regarding stopping Aspirin, Coumadin, Plavix, Eliquis, Pradaxa, or Pletal. ? ?One week prior to surgery: ?Stop Anti-inflammatories (NSAIDS) such as Advil, Aleve, Ibuprofen, Motrin, Naproxen, Naprosyn and Aspirin based products such as Excedrin, Goodys Powder, BC Powder. ? ?Stop ANY OVER THE COUNTER supplements until after surgery. ? ?You may take Tylenol if needed for pain up until the day of surgery. ? ?No Alcohol for 24 hours before or after surgery. ? ?No Smoking including e-cigarettes for 24 hours prior to  surgery.  ?No chewable tobacco products for at least 6 hours prior to surgery.  ?No nicotine patches on the day of surgery. ? ?Do not use any "recreational" drugs for at least a week prior to your surgery.  ?Please be advised that the combination of cocaine and anesthesia may have negative outcomes, up to and including death. ?If you test positive for cocaine, your surgery will be cancelled. ? ?On the morning of surgery brush your teeth with toothpaste and water, you may rinse your mouth with mouthwash if you wish. ?Do not swallow any toothpaste or mouthwash. ? ?Use CHG Soap or wipes as directed on instruction sheet. ? ?Do not wear jewelry, make-up, hairpins, clips or nail polish. ? ?Do not wear lotions, powders, or perfumes.  ? ?Do not shave body from the neck down 48 hours prior to surgery just in case you cut yourself which could leave a site for infection.  ?Also, freshly shaved skin may become irritated if using the CHG soap. ? ?Contact lenses, hearing aids and dentures may not be worn into surgery. ? ?Do not bring valuables to the hospital. [facility] is not responsible for any missing/lost belongings or valuables.  ? ?Notify your doctor if there is any change in your medical condition (cold, fever, infection). ? ?Wear comfortable clothing (specific to your surgery type) to the hospital. ? ?After surgery, you can help prevent lung complications by doing breathing exercises.  ?Take deep breaths and cough every 1-2 hours. Your doctor may order a device called an Incentive Spirometer to help you take deep breaths. ?When coughing  or sneezing, hold a pillow firmly against your incision with both hands. This is called ?splinting.? Doing this helps protect your incision. It also decreases belly discomfort. ? ?If you are being admitted to the hospital overnight, leave your suitcase in the car. ?After surgery it may be brought to your room. ? ?If you are being discharged the day of surgery, you will not be allowed to  drive home. ?You will need a responsible adult (18 years or older) to drive you home and stay with you that night.  ? ?If you are taking public transportation, you will need to have a responsible adult (18 years or older) with you. ?Please confirm with your physician that it is acceptable to use public transportation.  ? ?Please call the [facility] [facility] Dept. at [phone] if you have any questions about these instructions. ? ?Surgery Visitation Policy: ? ?Patients undergoing a surgery or procedure may have two family members or support persons with them as long as the person is not COVID-19 positive or experiencing its symptoms.  ? ?Inpatient Visitation:   ? ?Visiting hours are 7 a.m. to 8 p.m. ?Up to four visitors are allowed at one time in a patient room, including children. The visitors may rotate out with other people during the day. One designated support person (adult) may remain overnight.  ?

## 2021-06-11 ENCOUNTER — Encounter: Payer: Self-pay | Admitting: Orthopedic Surgery

## 2021-06-11 ENCOUNTER — Ambulatory Visit: Payer: Medicare HMO

## 2021-06-11 ENCOUNTER — Ambulatory Visit: Payer: Medicare HMO | Admitting: Anesthesiology

## 2021-06-11 ENCOUNTER — Other Ambulatory Visit: Payer: Self-pay

## 2021-06-11 ENCOUNTER — Encounter
Admission: RE | Disposition: A | Discharge status: Home Health | Payer: Self-pay | Source: Home / Self Care | Attending: Orthopedic Surgery

## 2021-06-11 ENCOUNTER — Observation Stay
Admission: RE | Admit: 2021-06-11 | Discharge: 2021-06-12 | Disposition: A | Discharge status: Home Health | Payer: Medicare HMO | Attending: Orthopedic Surgery | Admitting: Orthopedic Surgery

## 2021-06-11 ENCOUNTER — Observation Stay: Payer: Medicare HMO

## 2021-06-11 DIAGNOSIS — Z7982 Long term (current) use of aspirin: Secondary | ICD-10-CM | POA: Insufficient documentation

## 2021-06-11 DIAGNOSIS — Z79899 Other long term (current) drug therapy: Secondary | ICD-10-CM | POA: Diagnosis not present

## 2021-06-11 DIAGNOSIS — I2510 Atherosclerotic heart disease of native coronary artery without angina pectoris: Secondary | ICD-10-CM | POA: Insufficient documentation

## 2021-06-11 DIAGNOSIS — F17210 Nicotine dependence, cigarettes, uncomplicated: Secondary | ICD-10-CM | POA: Diagnosis not present

## 2021-06-11 DIAGNOSIS — Z96642 Presence of left artificial hip joint: Secondary | ICD-10-CM

## 2021-06-11 DIAGNOSIS — M1612 Unilateral primary osteoarthritis, left hip: Principal | ICD-10-CM | POA: Insufficient documentation

## 2021-06-11 DIAGNOSIS — I10 Essential (primary) hypertension: Secondary | ICD-10-CM | POA: Insufficient documentation

## 2021-06-11 DIAGNOSIS — J45909 Unspecified asthma, uncomplicated: Secondary | ICD-10-CM | POA: Insufficient documentation

## 2021-06-11 DIAGNOSIS — J449 Chronic obstructive pulmonary disease, unspecified: Secondary | ICD-10-CM | POA: Insufficient documentation

## 2021-06-11 HISTORY — PX: TOTAL HIP ARTHROPLASTY: SHX124

## 2021-06-11 LAB — CBC
HCT: 41.1 % (ref 39.0–52.0)
Hemoglobin: 13.4 g/dL (ref 13.0–17.0)
MCH: 29.3 pg (ref 26.0–34.0)
MCHC: 32.6 g/dL (ref 30.0–36.0)
MCV: 89.7 fL (ref 80.0–100.0)
Platelets: 190 10*3/uL (ref 150–400)
RBC: 4.58 MIL/uL (ref 4.22–5.81)
RDW: 13.2 % (ref 11.5–15.5)
WBC: 16.9 10*3/uL — ABNORMAL HIGH (ref 4.0–10.5)
nRBC: 0 % (ref 0.0–0.2)

## 2021-06-11 LAB — CREATININE, SERUM
Creatinine, Ser: 1.33 mg/dL — ABNORMAL HIGH (ref 0.61–1.24)
GFR, Estimated: 57 mL/min — ABNORMAL LOW (ref >60)

## 2021-06-11 SURGERY — ARTHROPLASTY, HIP, TOTAL, ANTERIOR APPROACH
Anesthesia: Spinal | Site: Hip | Laterality: Left

## 2021-06-11 MED ORDER — MIDAZOLAM HCL 5 MG/5ML IJ SOLN
INTRAMUSCULAR | Status: DC | PRN
  Administered 2021-06-11 (×2): 1 mg via INTRAVENOUS

## 2021-06-11 MED ORDER — BUPIVACAINE HCL (PF) 0.5 % IJ SOLN
INTRAMUSCULAR | Status: DC | PRN
  Administered 2021-06-11: 2.5 mL

## 2021-06-11 MED ORDER — METHOCARBAMOL 1000 MG/10ML IJ SOLN
500.0000 mg | Freq: Four times a day (QID) | INTRAVENOUS | Status: DC | PRN
  Filled 2021-06-11: qty 5

## 2021-06-11 MED ORDER — ALUM & MAG HYDROXIDE-SIMETH 200-200-20 MG/5ML PO SUSP
30.0000 mL | ORAL | Status: DC | PRN
  Administered 2021-06-11: 30 mL via ORAL
  Filled 2021-06-11: qty 30

## 2021-06-11 MED ORDER — ONDANSETRON HCL 4 MG/2ML IJ SOLN
INTRAMUSCULAR | Status: DC | PRN
  Administered 2021-06-11: 4 mg via INTRAVENOUS

## 2021-06-11 MED ORDER — SUCCINYLCHOLINE CHLORIDE 200 MG/10ML IV SOSY
PREFILLED_SYRINGE | INTRAVENOUS | Status: DC | PRN
Start: 2021-06-11 — End: 2021-06-11
  Administered 2021-06-11: 120 mg via INTRAVENOUS

## 2021-06-11 MED ORDER — OXYCODONE HCL 5 MG PO TABS: 5.0000 mg | ORAL_TABLET | Freq: Once | ORAL | Status: DC | PRN

## 2021-06-11 MED ORDER — SODIUM CHLORIDE 0.9 % IV SOLN: INTRAVENOUS | Status: DC

## 2021-06-11 MED ORDER — ONDANSETRON HCL 4 MG/2ML IJ SOLN: 4.0000 mg | Freq: Four times a day (QID) | INTRAMUSCULAR | Status: DC | PRN

## 2021-06-11 MED ORDER — FAMOTIDINE 20 MG PO TABS
ORAL_TABLET | ORAL | Status: AC
  Filled 2021-06-11: qty 1

## 2021-06-11 MED ORDER — POLYETHYLENE GLYCOL 3350 17 G PO PACK: 17.0000 g | PACK | Freq: Every day | ORAL | Status: DC | PRN

## 2021-06-11 MED ORDER — FAMOTIDINE 20 MG PO TABS: 20.0000 mg | ORAL_TABLET | Freq: Once | ORAL | Status: DC

## 2021-06-11 MED ORDER — CEFAZOLIN SODIUM-DEXTROSE 2-4 GM/100ML-% IV SOLN
INTRAVENOUS | Status: AC
  Administered 2021-06-11: 2 g via INTRAVENOUS
  Filled 2021-06-11: qty 100

## 2021-06-11 MED ORDER — LACTATED RINGERS IV SOLN: INTRAVENOUS | Status: DC

## 2021-06-11 MED ORDER — DOCUSATE SODIUM 100 MG PO CAPS
100.0000 mg | ORAL_CAPSULE | Freq: Two times a day (BID) | ORAL | Status: DC
  Administered 2021-06-11 – 2021-06-12 (×2): 100 mg via ORAL
  Filled 2021-06-11 (×2): qty 1

## 2021-06-11 MED ORDER — MORPHINE SULFATE (PF) 2 MG/ML IV SOLN: 0.5000 mg | INTRAVENOUS | Status: DC | PRN

## 2021-06-11 MED ORDER — ONDANSETRON HCL 4 MG PO TABS: 4.0000 mg | ORAL_TABLET | Freq: Four times a day (QID) | ORAL | Status: DC | PRN

## 2021-06-11 MED ORDER — CHLORHEXIDINE GLUCONATE 0.12 % MT SOLN
OROMUCOSAL | Status: AC
  Administered 2021-06-11: 15 mL via OROMUCOSAL
  Filled 2021-06-11: qty 15

## 2021-06-11 MED ORDER — AMLODIPINE BESYLATE 5 MG PO TABS
5.0000 mg | ORAL_TABLET | Freq: Every day | ORAL | Status: DC
  Administered 2021-06-12: 5 mg via ORAL
  Filled 2021-06-11: qty 1

## 2021-06-11 MED ORDER — PRAVASTATIN SODIUM 20 MG PO TABS
20.0000 mg | ORAL_TABLET | Freq: Every evening | ORAL | Status: DC
  Administered 2021-06-11: 20 mg via ORAL
  Filled 2021-06-11: qty 1

## 2021-06-11 MED ORDER — TRAMADOL HCL 50 MG PO TABS
50.0000 mg | ORAL_TABLET | Freq: Four times a day (QID) | ORAL | Status: DC
  Administered 2021-06-11 – 2021-06-12 (×2): 50 mg via ORAL
  Filled 2021-06-11 (×2): qty 1

## 2021-06-11 MED ORDER — MENTHOL 3 MG MT LOZG: 1.0000 | LOZENGE | OROMUCOSAL | Status: DC | PRN

## 2021-06-11 MED ORDER — ORAL CARE MOUTH RINSE: 15.0000 mL | Freq: Once | OROMUCOSAL | Status: AC

## 2021-06-11 MED ORDER — ACETAMINOPHEN 325 MG PO TABS: 325.0000 mg | ORAL_TABLET | Freq: Four times a day (QID) | ORAL | Status: DC | PRN

## 2021-06-11 MED ORDER — HYDROCODONE-ACETAMINOPHEN 7.5-325 MG PO TABS
1.0000 | ORAL_TABLET | ORAL | Status: DC | PRN
  Administered 2021-06-12: 1 via ORAL
  Filled 2021-06-11: qty 1

## 2021-06-11 MED ORDER — DIPHENHYDRAMINE HCL 12.5 MG/5ML PO ELIX
12.5000 mg | ORAL_SOLUTION | ORAL | Status: DC | PRN
  Administered 2021-06-11: 25 mg via ORAL
  Administered 2021-06-12: 12.5 mg via ORAL
  Filled 2021-06-11: qty 5
  Filled 2021-06-11: qty 10

## 2021-06-11 MED ORDER — METHOCARBAMOL 500 MG PO TABS: 500.0000 mg | ORAL_TABLET | Freq: Four times a day (QID) | ORAL | Status: DC | PRN

## 2021-06-11 MED ORDER — ROCURONIUM BROMIDE 100 MG/10ML IV SOLN
INTRAVENOUS | Status: DC | PRN
  Administered 2021-06-11: 50 mg via INTRAVENOUS

## 2021-06-11 MED ORDER — SUGAMMADEX SODIUM 200 MG/2ML IV SOLN
INTRAVENOUS | Status: DC | PRN
  Administered 2021-06-11: 200 mg via INTRAVENOUS

## 2021-06-11 MED ORDER — CHLORHEXIDINE GLUCONATE 0.12 % MT SOLN: 15.0000 mL | Freq: Once | OROMUCOSAL | Status: AC

## 2021-06-11 MED ORDER — ZOLPIDEM TARTRATE 5 MG PO TABS
5.0000 mg | ORAL_TABLET | Freq: Every evening | ORAL | Status: DC | PRN
  Administered 2021-06-11: 5 mg via ORAL
  Filled 2021-06-11: qty 1

## 2021-06-11 MED ORDER — CEFAZOLIN SODIUM-DEXTROSE 2-4 GM/100ML-% IV SOLN
2.0000 g | INTRAVENOUS | Status: AC
  Administered 2021-06-11: 2 g via INTRAVENOUS

## 2021-06-11 MED ORDER — OXYCODONE HCL 5 MG/5ML PO SOLN: 5.0000 mg | Freq: Once | ORAL | Status: DC | PRN

## 2021-06-11 MED ORDER — CEFAZOLIN SODIUM-DEXTROSE 2-4 GM/100ML-% IV SOLN
2.0000 g | Freq: Four times a day (QID) | INTRAVENOUS | Status: AC
  Administered 2021-06-11: 2 g via INTRAVENOUS
  Filled 2021-06-11 (×2): qty 100

## 2021-06-11 MED ORDER — FENTANYL CITRATE (PF) 100 MCG/2ML IJ SOLN
INTRAMUSCULAR | Status: AC
  Administered 2021-06-11: 25 ug via INTRAVENOUS
  Filled 2021-06-11: qty 2

## 2021-06-11 MED ORDER — PHENYLEPHRINE HCL-NACL 20-0.9 MG/250ML-% IV SOLN
INTRAVENOUS | Status: DC | PRN
  Administered 2021-06-11: 40 ug/min via INTRAVENOUS

## 2021-06-11 MED ORDER — PANTOPRAZOLE SODIUM 40 MG PO TBEC
40.0000 mg | DELAYED_RELEASE_TABLET | Freq: Every day | ORAL | Status: DC
  Administered 2021-06-11 – 2021-06-12 (×2): 40 mg via ORAL
  Filled 2021-06-11 (×2): qty 1

## 2021-06-11 MED ORDER — METOCLOPRAMIDE HCL 10 MG PO TABS: 5.0000 mg | ORAL_TABLET | Freq: Three times a day (TID) | ORAL | Status: DC | PRN

## 2021-06-11 MED ORDER — SURGIPHOR WOUND IRRIGATION SYSTEM - OPTIME: TOPICAL | Status: DC | PRN

## 2021-06-11 MED ORDER — SODIUM CHLORIDE (PF) 0.9 % IJ SOLN
INTRAMUSCULAR | Status: DC | PRN
  Administered 2021-06-11: 90 mL via INTRAMUSCULAR

## 2021-06-11 MED ORDER — METOCLOPRAMIDE HCL 5 MG/ML IJ SOLN: 5.0000 mg | Freq: Three times a day (TID) | INTRAMUSCULAR | Status: DC | PRN

## 2021-06-11 MED ORDER — PROPOFOL 500 MG/50ML IV EMUL
INTRAVENOUS | Status: DC | PRN
  Administered 2021-06-11: 200 ug/kg/min via INTRAVENOUS

## 2021-06-11 MED ORDER — FENTANYL CITRATE (PF) 100 MCG/2ML IJ SOLN
25.0000 ug | INTRAMUSCULAR | Status: DC | PRN
  Administered 2021-06-11 (×2): 25 ug via INTRAVENOUS

## 2021-06-11 MED ORDER — MIDAZOLAM HCL 2 MG/2ML IJ SOLN
INTRAMUSCULAR | Status: AC
  Filled 2021-06-11: qty 2

## 2021-06-11 MED ORDER — BISACODYL 5 MG PO TBEC: 5.0000 mg | DELAYED_RELEASE_TABLET | Freq: Every day | ORAL | Status: DC | PRN

## 2021-06-11 MED ORDER — PHENOL 1.4 % MT LIQD: 1.0000 | OROMUCOSAL | Status: DC | PRN

## 2021-06-11 MED ORDER — HYDROCODONE-ACETAMINOPHEN 5-325 MG PO TABS
1.0000 | ORAL_TABLET | ORAL | Status: DC | PRN
  Administered 2021-06-11 – 2021-06-12 (×2): 1 via ORAL
  Filled 2021-06-11 (×2): qty 1

## 2021-06-11 MED ORDER — ASPIRIN EC 81 MG PO TBEC
81.0000 mg | DELAYED_RELEASE_TABLET | Freq: Every day | ORAL | Status: DC
  Administered 2021-06-12: 81 mg via ORAL
  Filled 2021-06-11: qty 1

## 2021-06-11 MED ORDER — PROPOFOL 1000 MG/100ML IV EMUL
INTRAVENOUS | Status: AC
  Filled 2021-06-11: qty 100

## 2021-06-11 MED ORDER — FLEET ENEMA 7-19 GM/118ML RE ENEM: 1.0000 | ENEMA | Freq: Once | RECTAL | Status: DC | PRN

## 2021-06-11 MED ORDER — ENOXAPARIN SODIUM 40 MG/0.4ML IJ SOSY
40.0000 mg | PREFILLED_SYRINGE | INTRAMUSCULAR | Status: DC
  Administered 2021-06-12: 40 mg via SUBCUTANEOUS
  Filled 2021-06-11: qty 0.4

## 2021-06-11 MED ORDER — 0.9 % SODIUM CHLORIDE (POUR BTL) OPTIME
TOPICAL | Status: DC | PRN
  Administered 2021-06-11: 1000 mL

## 2021-06-11 MED ORDER — PHENYLEPHRINE 80 MCG/ML (10ML) SYRINGE FOR IV PUSH (FOR BLOOD PRESSURE SUPPORT)
PREFILLED_SYRINGE | INTRAVENOUS | Status: DC | PRN
  Administered 2021-06-11: 80 ug via INTRAVENOUS
  Administered 2021-06-11: 120 ug via INTRAVENOUS

## 2021-06-11 MED ORDER — DEXAMETHASONE SODIUM PHOSPHATE 10 MG/ML IJ SOLN
INTRAMUSCULAR | Status: DC | PRN
  Administered 2021-06-11: 10 mg via INTRAVENOUS

## 2021-06-11 MED ORDER — HEMOSTATIC AGENTS (NO CHARGE) OPTIME
TOPICAL | Status: DC | PRN
  Administered 2021-06-11: 2 via TOPICAL

## 2021-06-11 SURGICAL SUPPLY — 54 items
BLADE SAGITTAL AGGR TOOTH XLG (BLADE) ×2 IMPLANT
BNDG COHESIVE 6X5 TAN ST LF (GAUZE/BANDAGES/DRESSINGS) ×4 IMPLANT
CANISTER WOUND CARE 500ML ATS (WOUND CARE) ×2 IMPLANT
CHLORAPREP W/TINT 26 (MISCELLANEOUS) ×2 IMPLANT
COVER BACK TABLE REUSABLE LG (DRAPES) ×2 IMPLANT
DRAPE 3/4 80X56 (DRAPES) ×5 IMPLANT
DRAPE C-ARM XRAY 36X54 (DRAPES) ×2 IMPLANT
DRAPE INCISE IOBAN 66X60 STRL (DRAPES) IMPLANT
DRAPE POUCH INSTRU U-SHP 10X18 (DRAPES) ×2 IMPLANT
DRESSING SURGICEL FIBRLLR 1X2 (HEMOSTASIS) ×2 IMPLANT
DRSG MEPILEX SACRM 8.7X9.8 (GAUZE/BANDAGES/DRESSINGS) ×2 IMPLANT
DRSG SURGICEL FIBRILLAR 1X2 (HEMOSTASIS) ×4
ELECT BLADE 6.5 EXT (BLADE) ×2 IMPLANT
ELECT REM PT RETURN 9FT ADLT (ELECTROSURGICAL) ×2
ELECTRODE REM PT RTRN 9FT ADLT (ELECTROSURGICAL) ×1 IMPLANT
GLOVE SURG SYN 9.0  PF PI (GLOVE) ×2
GLOVE SURG SYN 9.0 PF PI (GLOVE) ×2 IMPLANT
GLOVE SURG UNDER POLY LF SZ9 (GLOVE) ×2 IMPLANT
GOWN SRG 2XL LVL 4 RGLN SLV (GOWNS) ×1 IMPLANT
GOWN STRL NON-REIN 2XL LVL4 (GOWNS) ×1
GOWN STRL REUS W/ TWL LRG LVL3 (GOWN DISPOSABLE) ×1 IMPLANT
GOWN STRL REUS W/TWL LRG LVL3 (GOWN DISPOSABLE) ×1
HIP FEM HD M 28 (Head) ×1 IMPLANT
HOLDER FOLEY CATH W/STRAP (MISCELLANEOUS) ×1 IMPLANT
HOOD PEEL AWAY FLYTE STAYCOOL (MISCELLANEOUS) ×2 IMPLANT
KIT PREVENA INCISION MGT 13 (CANNISTER) ×2 IMPLANT
LINER DM 28MM (Liner) ×2 IMPLANT
LINER DM SZH 28X56 (Liner) IMPLANT
MANIFOLD NEPTUNE II (INSTRUMENTS) ×2 IMPLANT
MAT ABSORB  FLUID 56X50 GRAY (MISCELLANEOUS) ×1
MAT ABSORB FLUID 56X50 GRAY (MISCELLANEOUS) ×1 IMPLANT
NDL SPNL 20GX3.5 QUINCKE YW (NEEDLE) ×2 IMPLANT
NEEDLE SPNL 20GX3.5 QUINCKE YW (NEEDLE) ×4 IMPLANT
NS IRRIG 1000ML POUR BTL (IV SOLUTION) ×2 IMPLANT
PACK HIP COMPR (MISCELLANEOUS) ×2 IMPLANT
SCALPEL PROTECTED #10 DISP (BLADE) ×4 IMPLANT
SHELL ACETABULAR DM  56MM (Shell) ×1 IMPLANT
SOLUTION IRRIG SURGIPHOR (IV SOLUTION) ×1 IMPLANT
STAPLER SKIN PROX 35W (STAPLE) ×2 IMPLANT
STEM FEMORAL SZ7 STD COLLARED (Stem) ×1 IMPLANT
STRAP SAFETY 5IN WIDE (MISCELLANEOUS) ×2 IMPLANT
SUT DVC 2 QUILL PDO  T11 36X36 (SUTURE) ×1
SUT DVC 2 QUILL PDO T11 36X36 (SUTURE) ×1 IMPLANT
SUT SILK 0 (SUTURE) ×1
SUT SILK 0 30XBRD TIE 6 (SUTURE) ×1 IMPLANT
SUT V-LOC 90 ABS DVC 3-0 CL (SUTURE) ×2 IMPLANT
SUT VIC AB 1 CT1 36 (SUTURE) ×2 IMPLANT
SYR 20ML LL LF (SYRINGE) ×2 IMPLANT
SYR 30ML LL (SYRINGE) ×2 IMPLANT
SYR 50ML LL SCALE MARK (SYRINGE) ×4 IMPLANT
SYR BULB IRRIG 60ML STRL (SYRINGE) ×2 IMPLANT
TAPE MICROFOAM 4IN (TAPE) ×2 IMPLANT
TOWEL OR 17X26 4PK STRL BLUE (TOWEL DISPOSABLE) IMPLANT
TRAY FOLEY MTR SLVR 16FR STAT (SET/KITS/TRAYS/PACK) ×1 IMPLANT

## 2021-06-11 NOTE — Anesthesia Preprocedure Evaluation (Signed)
Anesthesia Evaluation  ?Patient identified by MRN, date of birth, ID band ?Patient awake ? ? ? ?Reviewed: ?Allergy & Precautions, NPO status , Patient's Chart, lab work & pertinent test results ? ?History of Anesthesia Complications ?Negative for: history of anesthetic complications ? ?Airway ?Mallampati: III ? ?TM Distance: >3 FB ?Neck ROM: full ? ? ? Dental ? ?(+) Chipped, Poor Dentition, Missing ?  ?Pulmonary ?neg shortness of breath, sleep apnea , COPD, Current Smoker and Patient abstained from smoking.,  ?  ?Pulmonary exam normal ? ? ? ? ? ? ? Cardiovascular ?Exercise Tolerance: Good ?hypertension, (-) angina+ CAD  ?(-) Past MI and (-) DOE Normal cardiovascular exam ? ? ?  ?Neuro/Psych ?PSYCHIATRIC DISORDERS negative neurological ROS ?   ? GI/Hepatic ?negative GI ROS, Neg liver ROS, neg GERD  ,  ?Endo/Other  ?negative endocrine ROS ? Renal/GU ?  ? ?  ?Musculoskeletal ? ?(+) Arthritis ,  ? Abdominal ?  ?Peds ? Hematology ?negative hematology ROS ?(+)   ?Anesthesia Other Findings ?Past Medical History: ?No date: Anxiety ?    Comment:  in small places ?No date: Arthritis ?No date: COPD (chronic obstructive pulmonary disease) ([place]) ?No date: Coronary artery disease ?No date: Hypertension ?01/16/2021: Iliac aneurysm ([place]) ?No date: Panic attacks ? ?Past Surgical History: ?01/16/2021: ENDOVASCULAR REPAIR/STENT GRAFT; N/A ?    Comment:  Procedure: ENDOVASCULAR REPAIR/STENT GRAFT;  Surgeon:  ?             Algernon Huxley, MD;  Location: [facility];   ?             Service: Cardiovascular;  Laterality: N/A; ?01/16/2021: ILIAC ARTERY ANEURYSM REPAIR ?No date: NO PAST SURGERIES ? ?BMI   ? Body Mass Index: 27.06 kg/m?  ?  ? ? Reproductive/Obstetrics ?negative OB ROS ? ?  ? ? ? ? ? ? ? ? ? ? ? ? ? ?  ?  ? ? ? ? ? ? ? ? ?Anesthesia Physical ?Anesthesia Plan ? ?ASA: 3 ? ?Anesthesia Plan: Spinal  ? ?Post-op Pain Management:   ? ?Induction:  ? ?PONV Risk Score and Plan:  ? ?Airway  Management Planned: Natural Airway and Nasal Cannula ? ?Additional Equipment:  ? ?Intra-op Plan:  ? ?Post-operative Plan:  ? ?Informed Consent: I have reviewed the patients History and Physical, chart, labs and discussed the procedure including the risks, benefits and alternatives for the proposed anesthesia with the patient or authorized representative who has indicated his/her understanding and acceptance.  ? ? ? ?Dental Advisory Given ? ?Plan Discussed with: Anesthesiologist, CRNA and Surgeon ? ?Anesthesia Plan Comments: (Patient reports no bleeding problems and no anticoagulant use. ? ?Plan for spinal with backup GA ? ?Patient consented for risks of anesthesia including but not limited to:  ?- adverse reactions to medications ?- damage to eyes, teeth, lips or other oral mucosa ?- nerve damage due to positioning  ?- risk of bleeding, infection and or nerve damage from spinal that could lead to paralysis ?- risk of headache or failed spinal ?- damage to teeth, lips or other oral mucosa ?- sore throat or hoarseness ?- damage to heart, brain, nerves, lungs, other parts of body or loss of life ? ?Patient voiced understanding.)  ? ? ? ? ? ? ?Anesthesia Quick Evaluation ? ?

## 2021-06-11 NOTE — Anesthesia Procedure Notes (Signed)
Procedure Name: Intubation ?Date/Time: 06/11/2021 10:23 AM ?Performed by: Marshell Garfinkel, RN ?Pre-anesthesia Checklist: Patient identified, Patient being monitored, Timeout performed, Emergency Drugs available and Suction available ?Patient Re-evaluated:Patient Re-evaluated prior to induction ?Oxygen Delivery Method: Circle system utilized ?Preoxygenation: Pre-oxygenation with 100% oxygen ?Induction Type: IV induction ?Ventilation: Mask ventilation without difficulty ?Laryngoscope Size: Mac and 3 ?Grade View: Grade I ?Tube type: Oral ?Tube size: 7.0 mm ?Number of attempts: 1 ?Airway Equipment and Method: Stylet ?Placement Confirmation: ETT inserted through vocal cords under direct vision, positive ETCO2 and breath sounds checked- equal and bilateral ?Secured at: 22 cm ?Tube secured with: Tape ?Dental Injury: Teeth and Oropharynx as per pre-operative assessment  ? ? ? ? ?

## 2021-06-11 NOTE — Anesthesia Procedure Notes (Signed)
Spinal ? ?Patient location during procedure: OR ?Start time: 06/11/2021 10:00 AM ?End time: 06/11/2021 10:07 AM ?Reason for block: surgical anesthesia ?Preanesthetic Checklist ?Completed: patient identified, IV checked, site marked, risks and benefits discussed, surgical consent, monitors and equipment checked, pre-op evaluation and timeout performed ?Spinal Block ?Patient position: sitting ?Prep: Betadine ?Patient monitoring: heart rate, continuous pulse ox, blood pressure and cardiac monitor ?Approach: midline ?Location: L4-5 ?Injection technique: single-shot ?Needle ?Needle type: Whitacre and Introducer  ?Needle gauge: 24 G ?Needle length: 9 cm ?Assessment ?Events: CSF return ?Additional Notes ?Negative paresthesia. Negative blood return. Positive free-flowing CSF. Expiration date of kit checked and confirmed. Patient tolerated procedure well, without complications. ? ? ? ? ? ?

## 2021-06-11 NOTE — H&P (Signed)
?Chief Complaint  ?Patient presents with  ? History and Physical forl left total hip arthroplasty 4/18  ? ?History of Present Illness:  ? ?Blake Padilla is a 71 y.o. male that presents to clinic today for his preoperative history and evaluation. Patient presents unaccompanied. The patient is scheduled to undergo a left total hip arthroplasty on 06/11/21 by Dr. Rudene Christians. His pain began over 6 months ago. The pain is located primarily in the left knee as the left hip and groin. He describes his pain as worse with weightbearing. He reports associated loss of range of motion and difficulty getting in and out of a car. He denies associated numbness or tingling.  ? ?The patient's symptoms have progressed to the point that they decrease his quality of life. The patient has previously undergone conservative treatment including NSAIDS and activity modification without adequate control of his symptoms. ? ?He is followed by [facility] in cardiology. Denies history of lumbar surgery, DVT. He will have his wife and daughter to help at home after surgery. ? ?No known drug allergies.  ? ?Past Medical, Surgical, Family, Social History, Allergies, Medications:  ? ?Past Medical History:  ?Past Medical History:  ?Diagnosis Date  ? Asthma, unspecified asthma severity, unspecified whether complicated, unspecified whether persistent  ? ?Past Surgical History: History reviewed. No pertinent surgical history. ? ?Current Medications:  ?Current Outpatient Medications  ?Medication Sig Dispense Refill  ? amLODIPine (NORVASC) 5 MG tablet Take 1 tablet by mouth once daily  ? aspirin 81 MG EC tablet Take by mouth  ? citalopram (CELEXA) 10 MG tablet Take 10 mg by mouth once daily as needed Pt states he takes when he feels he needs it  ? ezetimibe (ZETIA) 10 mg tablet Take 10 mg by mouth once daily  ? ?No current facility-administered medications for this visit.  ? ?Allergies: No Known Allergies ? ?Social History:  ?Social History   ? ?Socioeconomic History  ? Marital status: Married  ?Spouse name: Susie  ? Number of children: 4  ? Years of education: 70  ? Highest education level: High school graduate  ?Occupational History  ? Occupation: Full-time - Manufacturing systems engineer  ?Tobacco Use  ? Smoking status: Every Day  ?Packs/day: 0.50  ?Years: 50.00  ?Pack years: 25.00  ?Types: Cigarettes  ? Smokeless tobacco: Never  ?Vaping Use  ? Vaping Use: Never used  ?Substance and Sexual Activity  ? Alcohol use: Not Currently  ? Drug use: Not Currently  ? ?Family History: History reviewed. No pertinent family history. ? ?Review of Systems:  ? ?A 10+ ROS was performed, reviewed, and the pertinent orthopaedic findings are documented in the HPI.  ? ?Physical Examination:  ? ?BP 114/78  Ht 190.5 cm ('6\' 3"'$ )  Wt 98.3 kg (216 lb 12.8 oz)  BMI 27.10 kg/m?  ? ?Patient is a well-developed, well-nourished male in no acute distress. Patient has normal mood and affect. Patient is alert and oriented to person, place, and time.  ? ?HEENT: Atraumatic, normocephalic. Pupils equal and reactive to light. Extraocular motion intact. Noninjected sclera. ? ?Cardiovascular: Regular rate and rhythm, with no murmurs, rubs, or gallops. Distal pulses palpable. ? ?Respiratory: Lungs clear to auscultation bilaterally.  ? ?Brief exam of the left hip shows 100 degrees of flexion with 15 degrees of internal rotation and 20 degrees of external rotation with pain.  ? ?Able to plantarflex and dorsiflex the left ankle. Able to flex and extend the toes. ? ?Sensation is intact to light touch over the saphenous, lateral  cutaneous, superficial fibular, and deep fibular nerve distributions. ? ?Tests Performed/Reviewed:  ?X-rays ? ?No new radiographs were obtained today. Previous radiographs were reviewed of the left hip and revealed severe loss of femoral acetabular joint space superiorly with sclerotic changes of the femoral head noted. Cyst formation of the femoral head noted. ? ?Impression:  ? ?ICD-10-CM   ?1. Primary osteoarthritis of left hip M16.12  ? ?Plan:  ? ?The patient has end-stage degenerative changes of the left hip. It was explained to the patient that the condition is progressive in nature. Having failed conservative treatment, the patient has elected to proceed with a total joint arthroplasty. The patient will undergo a total joint arthroplasty with Dr. Rudene Christians. The risks of surgery, including blood clot and infection, were discussed with the patient. Measures to reduce these risks, including the use of anticoagulation, perioperative antibiotics, and early ambulation were discussed. The importance of postoperative physical therapy was discussed with the patient. The patient elects to proceed with surgery. The patient is instructed to stop all blood thinners prior to surgery. The patient is instructed to call the hospital the day before surgery to learn of the proper arrival time.  ? ?Contact our office with any questions or concerns. Follow up as indicated, or sooner should any new problems arise, if conditions worsen, or if they are otherwise concerned.  ? ?Gwenlyn Fudge, PA-C ?[facility] ?[address] ?[place], [place] [address] ?Phone: [phone] ? ?This note was generated in part with voice recognition software and I apologize for any typographical errors that were not detected and corrected. ? ?Electronically signed by Gwenlyn Fudge, PA at 06/10/2021 12:14 PM EDT ? ?Reviewed  H+P. ?No changes noted. ? ? ?

## 2021-06-11 NOTE — Anesthesia Postprocedure Evaluation (Signed)
Anesthesia Post Note ? ?Patient: Blake Padilla ? ?Procedure(s) Performed: TOTAL HIP ARTHROPLASTY ANTERIOR APPROACH (Left: Hip) ? ?Patient location during evaluation: PACU ?Anesthesia Type: Spinal ?Level of consciousness: awake and alert ?Pain management: pain level controlled ?Vital Signs Assessment: post-procedure vital signs reviewed and stable ?Respiratory status: spontaneous breathing, nonlabored ventilation, respiratory function stable and patient connected to nasal cannula oxygen ?Cardiovascular status: blood pressure returned to baseline and stable ?Postop Assessment: no apparent nausea or vomiting ?Anesthetic complications: no ? ? ?No notable events documented. ? ? ?Last Vitals:  ?Vitals:  ? 06/11/21 1230 06/11/21 1300  ?BP: (!) 99/46 111/70  ?Pulse:  65  ?Resp: 16 11  ?Temp:    ?SpO2: 94% 96%  ?  ?Last Pain:  ?Vitals:  ? 06/11/21 1300  ?TempSrc:   ?PainSc: 0-No pain  ? ? ?  ?  ?  ?  ?  ?  ? ?Precious Haws [name] ? ? ? ? ?

## 2021-06-11 NOTE — Evaluation (Signed)
Physical Therapy Evaluation ?Patient Details ?Name: Blake Padilla ?MRN: [id number] ?DOB: [birth year] ?Today's Date: 06/11/2021 ? ?History of Present Illness ? admitted for acute hospitalization s/p L THA, WBAT (06/11/21)  ?Clinical Impression ? Patient just transferred to floor from PACU; eager for OOB to chair for positioning and comfort.  Rates L hip pain 5/10; meds received prior to session. Demonstrates fair/good post-op strength (3-/5) and ROM to L hip, limited by generalized post-op soreness; endorses full sensory return and good motor control to bilat LEs.  Currently requiring min assist for bed mobility; min assist for sit/stand, basic transfers and gait (110') with RW.  Demonstrates 3-point, step to gait pattern, decreased L LE stance/WBing; mod WBing bilat UEs.  Narrowed BOS, improves with cuing for increased BOS.  Patient very pleased with progress; anticipate consistent progress towards all rehab goals. ?Would benefit from skilled PT to address above deficits and promote optimal return to PLOF.; Recommend transition to HHPT upon discharge from acute hospitalization. ? ?   ? ?Recommendations for follow up therapy are one component of a multi-disciplinary discharge planning process, led by the attending physician.  Recommendations may be updated based on patient status, additional functional criteria and insurance authorization. ? ?Follow Up Recommendations Home health PT ? ?  ?Assistance Recommended at Discharge PRN  ?Patient can return home with the following ? A little help with walking and/or transfers;A little help with bathing/dressing/bathroom ? ?  ?Equipment Recommendations  (has RW)  ?Recommendations for Other Services ?    ?  ?Functional Status Assessment Patient has had a recent decline in their functional status and demonstrates the ability to make significant improvements in function in a reasonable and predictable amount of time.  ? ?  ?Precautions / Restrictions Precautions ?Precautions:  Fall ?Restrictions ?Weight Bearing Restrictions: Yes ?LLE Weight Bearing: Weight bearing as tolerated  ? ?  ? ?Mobility ? Bed Mobility ?Overal bed mobility: Needs Assistance ?Bed Mobility: Supine to Sit ?  ?  ?  ?  ?  ?  ?  ? ?Transfers ?Overall transfer level: Needs assistance ?Equipment used: Rolling walker (2 wheels) ?Transfers: Sit to/from Stand ?Sit to Stand: Min assist ?  ?  ?  ?  ?  ?General transfer comment: cuing for hand placement ?  ? ?Ambulation/Gait ?Ambulation/Gait assistance: Min assist ?Gait Distance (Feet): 110 Feet ?Assistive device: Rolling walker (2 wheels) ?  ?  ?  ?  ?General Gait Details: 3-point, step to gait pattern, decreased L LE stance/WBing; mod WBing bilat UEs.  Narrowed BOS, improves with cuing for increased BOS ? ?Stairs ?  ?  ?  ?  ?  ? ?Wheelchair Mobility ?  ? ?Modified Rankin (Stroke Patients Only) ?  ? ?  ? ?Balance Overall balance assessment: Needs assistance ?Sitting-balance support: No upper extremity supported, Feet supported ?Sitting balance-Leahy Scale: Good ?  ?  ?Standing balance support: Bilateral upper extremity supported ?Standing balance-Leahy Scale: Fair ?  ?  ?  ?  ?  ?  ?  ?  ?  ?  ?  ?  ?   ? ? ? ?Pertinent Vitals/Pain Pain Assessment ?Pain Assessment: Faces ?Faces Pain Scale: Hurts little more ?Pain Location: L hip ?Pain Descriptors / Indicators: Sore ?Pain Intervention(s): Limited activity within patient's tolerance, Monitored during session, Repositioned  ? ? ?Home Living Family/patient expects to be discharged to:: Private residence ?Living Arrangements: Spouse/significant other ?Available Help at Discharge: Family;Available PRN/intermittently ?Type of Home: House ?Home Access: Stairs to enter ?Entrance Stairs-Rails: Right;Can reach  both;Left ?Entrance Stairs-Number of Steps: 6 ?  ?Home Layout: One level ?Home Equipment: Conservation officer, nature (2 wheels) ?   ?  ?Prior Function Prior Level of Function : Independent/Modified Independent ?  ?  ?  ?  ?  ?  ?Mobility  Comments: Indep with ADLs, household and community mobilization without assist device; intermittent use of RW as L hip worsened.  Denies fall history.  Enjoys golfing. ?  ?  ? ? ?Hand Dominance  ?   ? ?  ?Extremity/Trunk Assessment  ? Upper Extremity Assessment ?Upper Extremity Assessment: Overall WFL for tasks assessed ?  ? ?Lower Extremity Assessment ?Lower Extremity Assessment: Generalized weakness (L hip grossly 3-/5, limited by post-op pain and soreness; otherwise, grossly WFL) ?  ? ?   ?Communication  ?    ?Cognition Arousal/Alertness: Awake/alert ?Behavior During Therapy: [facility] for tasks assessed/performed ?Overall Cognitive Status: Within Functional Limits for tasks assessed ?  ?  ?  ?  ?  ?  ?  ?  ?  ?  ?  ?  ?  ?  ?  ?  ?  ?  ?  ? ?  ?General Comments   ? ?  ?Exercises Other Exercises ?Other Exercises: Reviewed role of PT and progressive mobility; L LE WBing restrictions, safety with transfers/gait. Patient voiced understanding. ?Other Exercises: Supine L LE therex, 1x10, active ROM: ankle pumps, quad sets, SAQs, heel slides, hip abduct/adduct.  Good isolated strenght and movement  ? ?Assessment/Plan  ?  ?PT Assessment Patient needs continued PT services  ?PT Problem List Decreased strength;Decreased range of motion;Decreased activity tolerance;Decreased balance;Decreased mobility;Decreased knowledge of use of DME;Decreased safety awareness;Pain ? ?   ?  ?PT Treatment Interventions DME instruction;Gait training;Stair training;Functional mobility training;Therapeutic activities;Therapeutic exercise;Balance training;Patient/family education   ? ?PT Goals (Current goals can be found in the Care Plan section)  ?Acute Rehab PT Goals ?Patient Stated Goal: to return home ?PT Goal Formulation: With patient/family ?Time For Goal Achievement: 06/25/21 ?Potential to Achieve Goals: Good ? ?  ?Frequency BID ?  ? ? ?Co-evaluation   ?  ?  ?  ?  ? ? ?  ?AM-PAC PT "6 Clicks" Mobility  ?Outcome Measure Help needed turning  from your back to your side while in a flat bed without using bedrails?: None ?Help needed moving from lying on your back to sitting on the side of a flat bed without using bedrails?: None ?Help needed moving to and from a bed to a chair (including a wheelchair)?: A Little ?Help needed standing up from a chair using your arms (e.g., wheelchair or bedside chair)?: A Little ?Help needed to walk in hospital room?: A Little ?Help needed climbing 3-5 steps with a railing? : A Little ?6 Click Score: 20 ? ?  ?End of Session Equipment Utilized During Treatment: Gait belt ?Activity Tolerance: Patient tolerated treatment well ?Patient left: in chair;with call bell/phone within reach;with chair alarm set ?Nurse Communication: Mobility status ?PT Visit Diagnosis: Other abnormalities of gait and mobility (R26.89);Pain;Muscle weakness (generalized) (M62.81) ?Pain - Right/Left: Left ?Pain - part of body: Hip ?  ? ?Time: 1914-7829 ?PT Time Calculation (min) (ACUTE ONLY): 25 min ? ? ?Charges:   PT Evaluation ?$PT Eval Moderate Complexity: 1 Mod ?PT Treatments ?$Therapeutic Exercise: 8-22 mins ?  ?   ? ? ?Janvi Ammar H. Owens Shark, PT, DPT, NCS ?06/11/21, 5:16 PM ?[phone] ? ? ?

## 2021-06-11 NOTE — Transfer of Care (Signed)
Immediate Anesthesia Transfer of Care Note ? ?Patient: Blake Padilla ? ?Procedure(s) Performed: TOTAL HIP ARTHROPLASTY ANTERIOR APPROACH (Left: Hip) ? ?Patient Location: PACU ? ?Anesthesia Type:General ? ?Level of Consciousness: sedated ? ?Airway & Oxygen Therapy: Patient Spontanous Breathing and Patient connected to face mask oxygen ? ?Post-op Assessment: Report given to RN and Post -op Vital signs reviewed and stable ? ?Post vital signs: Reviewed and stable ? ?Last Vitals:  ?Vitals Value Taken Time  ?BP    ?Temp    ?Pulse    ?Resp    ?SpO2    ? ? ?Last Pain:  ?Vitals:  ? 06/11/21 0830  ?TempSrc: Tympanic  ?PainSc: 0-No pain  ?   ? ?  ? ?Complications: No notable events documented. ?

## 2021-06-11 NOTE — Op Note (Signed)
06/11/2021 ? ?11:49 AM ? ?PATIENT:  Blake Padilla  71 y.o. male ? ?PRE-OPERATIVE DIAGNOSIS:  Primary osteoarthritis of left hip  M16.12 ?Chronic left hip pain  M25.552 ? ?POST-OPERATIVE DIAGNOSIS:  Primary osteoarthritis of left hip  M16.12 ?Chronic left  ? ?PROCEDURE:  Procedure(s): ?TOTAL HIP ARTHROPLASTY ANTERIOR APPROACH (Left) ? ?SURGEON: Laurene Footman, MD ? ?ASSISTANTS: None ? ?ANESTHESIA:   general ? ?EBL:  Total I/O ?In: 800 [I.V.:700; IV Piggyback:100] ?Out: 200 [Blood:200] ? ?BLOOD ADMINISTERED:none ? ?DRAINS:  Incisional wound VAC   ? ?LOCAL MEDICATIONS USED:  MARCAINE    and OTHER Exparel ? ?SPECIMEN:  Source of Specimen:    Left femoral head ? ?DISPOSITION OF SPECIMEN:  PATHOLOGY ? ?COUNTS:  YES ? ?TOURNIQUET:  * No tourniquets in log * ? ?IMPLANTS: Medacta AMIS 7 standard stem, 56 mm Mpact DM cup and liner with metal M 28 mm head ? ?DICTATION: .Dragon Dictation   The patient was brought to the operating room and after spinal anesthesia was  patient was placed on the operative table with the ipsilateral foot into the Medacta attachment, contralateral leg on a well-padded table. C-arm was brought in and preop template x-ray taken. After prepping and draping in usual sterile fashion appropriate patient identification and timeout procedures were completed. Anterior approach to the hip was obtained and centered over the greater trochanter and TFL muscle. The subcutaneous tissue was incised hemostasis being achieved by electrocautery. TFL fascia was incised and the muscle retracted laterally deep retractor placed. The lateral femoral circumflex vessels were identified and ligated. The anterior capsule was exposed and a capsulotomy performed. The neck was identified and a femoral neck cut carried out with a saw. The head was removed without difficulty and showed sclerotic femoral head and acetabulum. Reaming was carried out to 56 mm and a 56 mm cup trial gave appropriate tightness to the acetabular  component a 56 DM cup was impacted into position. The leg was then externally rotated and ischiofemoral and pubofemoral releases carried out. The femur was sequentially broached to a size 7, size 7 standard with S head trials were placed and the final components chosen. The 7 standard stem was inserted along with a metal M 28 mm head and 56 mm liner. The hip was reduced and was stable the wound was thoroughly irrigated with fibrillar placed along the posterior capsule and medial neck. The deep fascia ws closed using a heavy Quill after infiltration of 30 cc of quarter percent Sensorcaine with epinephrine. diluted with Exparel throughout the case.  3-0 V-loc to close the skin with skin staples.  Incisional wound VAC applied and patient was sent to recovery in stable condition.  ? ?PLAN OF CARE: Admit for overnight observation ? ?

## 2021-06-12 ENCOUNTER — Encounter: Payer: Self-pay | Admitting: Orthopedic Surgery

## 2021-06-12 DIAGNOSIS — N471 Phimosis: Secondary | ICD-10-CM | POA: Diagnosis not present

## 2021-06-12 DIAGNOSIS — M1612 Unilateral primary osteoarthritis, left hip: Secondary | ICD-10-CM | POA: Diagnosis not present

## 2021-06-12 LAB — BASIC METABOLIC PANEL
Anion gap: 5 (ref 5–15)
BUN: 18 mg/dL (ref 8–23)
CO2: 26 mmol/L (ref 22–32)
Calcium: 8.4 mg/dL — ABNORMAL LOW (ref 8.9–10.3)
Chloride: 106 mmol/L (ref 98–111)
Creatinine, Ser: 1.2 mg/dL (ref 0.61–1.24)
GFR, Estimated: >60 mL/min (ref >60)
Glucose, Bld: 150 mg/dL — ABNORMAL HIGH (ref 70–99)
Potassium: 4.3 mmol/L (ref 3.5–5.1)
Sodium: 137 mmol/L (ref 135–145)

## 2021-06-12 LAB — CBC
HCT: 36.9 % — ABNORMAL LOW (ref 39.0–52.0)
Hemoglobin: 11.8 g/dL — ABNORMAL LOW (ref 13.0–17.0)
MCH: 28.7 pg (ref 26.0–34.0)
MCHC: 32 g/dL (ref 30.0–36.0)
MCV: 89.8 fL (ref 80.0–100.0)
Platelets: 163 10*3/uL (ref 150–400)
RBC: 4.11 MIL/uL — ABNORMAL LOW (ref 4.22–5.81)
RDW: 13.2 % (ref 11.5–15.5)
WBC: 15.7 10*3/uL — ABNORMAL HIGH (ref 4.0–10.5)
nRBC: 0 % (ref 0.0–0.2)

## 2021-06-12 MED ORDER — ENOXAPARIN SODIUM 40 MG/0.4ML IJ SOSY: 40.0000 mg | PREFILLED_SYRINGE | INTRAMUSCULAR | 0 refills | Status: DC

## 2021-06-12 MED ORDER — METHOCARBAMOL 500 MG PO TABS
500.0000 mg | ORAL_TABLET | Freq: Four times a day (QID) | ORAL | 0 refills | Status: DC | PRN
Start: 2021-06-12 — End: 2021-10-08

## 2021-06-12 MED ORDER — HYDROCODONE-ACETAMINOPHEN 7.5-325 MG PO TABS: 1.0000 | ORAL_TABLET | ORAL | 0 refills | Status: DC | PRN

## 2021-06-12 MED ORDER — DOCUSATE SODIUM 100 MG PO CAPS
100.0000 mg | ORAL_CAPSULE | Freq: Two times a day (BID) | ORAL | 0 refills | Status: DC
Start: 2021-06-12 — End: 2021-10-08

## 2021-06-12 MED ORDER — TRAMADOL HCL 50 MG PO TABS: 50.0000 mg | ORAL_TABLET | Freq: Four times a day (QID) | ORAL | 0 refills | Status: DC | PRN

## 2021-06-12 NOTE — Discharge Instructions (Addendum)
?ANTERIOR APPROACH TOTAL HIP REPLACEMENT POSTOPERATIVE DIRECTIONS ? ? ?Hip Rehabilitation, Guidelines Following Surgery  ?The results of a hip operation are greatly improved after range of motion and muscle strengthening exercises. Follow all safety measures which are given to protect your hip. If any of these exercises cause increased pain or swelling in your joint, decrease the amount until you are comfortable again. Then slowly increase the exercises. Call your caregiver if you have problems or questions.  ? ?HOME CARE INSTRUCTIONS  ?Remove items at home which could result in a fall. This includes throw rugs or furniture in walking pathways.  ?ICE to the affected hip every three hours for 30 minutes at a time and then as needed for pain and swelling.  Continue to use ice on the hip for pain and swelling from surgery. You may notice swelling that will progress down to the foot and ankle.  This is normal after surgery.  Elevate the leg when you are not up walking on it.   ?Continue to use the breathing machine which will help keep your temperature down.  It is common for your temperature to cycle up and down following surgery, especially at night when you are not up moving around and exerting yourself.  The breathing machine keeps your lungs expanded and your temperature down. ?Do not place pillow under knee, focus on keeping the knee straight while resting ? ?DIET ?You may resume your previous home diet once your are discharged from the hospital. ? ?DRESSING / WOUND CARE / SHOWERING ?Keep dressing clean and dry. Change dressing only as needed. You may shower after your first follow up appointment with [facility]. ? ?ACTIVITY ?Walk with your walker as instructed. ?Use walker as long as suggested by your caregivers. ?Avoid periods of inactivity such as sitting longer than an hour when not asleep. This helps prevent blood clots.  ?You may resume a sexual relationship in one month or when given the OK by  your doctor.  ?You may return to work once you are cleared by your doctor.  ?Do not drive a car for 6 weeks or until released by you surgeon.  ?Do not drive while taking narcotics. ? ?WEIGHT BEARING ?Weight bearing as tolerated. Use walker/cane as needed for at least 4 weeks post op. ? ?POSTOPERATIVE CONSTIPATION PROTOCOL ?Constipation - defined medically as fewer than three stools per week and severe constipation as less than one stool per week. ? ?One of the most common issues patients have following surgery is constipation.  Even if you have a regular bowel pattern at home, your normal regimen is likely to be disrupted due to multiple reasons following surgery.  Combination of anesthesia, postoperative narcotics, change in appetite and fluid intake all can affect your bowels.  In order to avoid complications following surgery, here are some recommendations in order to help you during your recovery period. ? ?Colace (docusate) - Pick up an over-the-counter form of Colace or another stool softener and take twice a day as long as you are requiring postoperative pain medications.  Take with a full glass of water daily.  If you experience loose stools or diarrhea, hold the colace until you stool forms back up.  If your symptoms do not get better within 1 week or if they get worse, check with your doctor. ? ?Dulcolax (bisacodyl) - Pick up over-the-counter and take as directed by the product packaging as needed to assist with the movement of your bowels.  Take with a full glass of  water.  Use this product as needed if not relieved by Colace only.  ? ?MiraLax (polyethylene glycol) - Pick up over-the-counter to have on hand.  MiraLax is a solution that will increase the amount of water in your bowels to assist with bowel movements.  Take as directed and can mix with a glass of water, juice, soda, coffee, or tea.  Take if you go more than two days without a movement. ?Do not use MiraLax more than once per day. Call your  doctor if you are still constipated or irregular after using this medication for 7 days in a row. ? ?If you continue to have problems with postoperative constipation, please contact the office for further assistance and recommendations.  If you experience "the worst abdominal pain ever" or develop nausea or vomiting, please contact the office immediatly for further recommendations for treatment. ? ?ITCHING ? If you experience itching with your medications, try taking only a single pain pill, or even half a pain pill at a time.  You can also use Benadryl over the counter for itching or also to help with sleep.  ? ?TED HOSE STOCKINGS ?Wear the elastic stockings on both legs for six weeks following surgery during the day but you may remove then at night for sleeping. ? ?MEDICATIONS ?See your medication summary on the ?After Visit Summary? that the nursing staff will review with you prior to discharge.  You may have some home medications which will be placed on hold until you complete the course of blood thinner medication.  It is important for you to complete the blood thinner medication as prescribed by your surgeon.  Continue your approved medications as instructed at time of discharge. ? ?PRECAUTIONS ?If you experience chest pain or shortness of breath - call 911 immediately for transfer to the hospital emergency department.  ?If you develop a fever greater that 101 F, purulent drainage from wound, increased redness or drainage from wound, foul odor from the wound/dressing, or calf pain - CONTACT YOUR SURGEON.   ?                                                ?FOLLOW-UP APPOINTMENTS ?Make sure you keep all of your appointments after your operation with your surgeon and caregivers. You should call the office at the above phone number and make an appointment for approximately two weeks after the date of your surgery or on the date instructed by your surgeon outlined in the "After Visit Summary". ? ? ?Follow up with  DR. Stoioff. Home health nursing to remove foley on 06/17/2021 ? ?RANGE OF MOTION AND STRENGTHENING EXERCISES  ?These exercises are designed to help you keep full movement of your hip joint. Follow your caregiver's or physical therapist's instructions. Perform all exercises about fifteen times, three times per day or as directed. Exercise both hips, even if you have had only one joint replacement. These exercises can be done on a training (exercise) mat, on the floor, on a table or on a bed. Use whatever works the best and is most comfortable for you. Use music or television while you are exercising so that the exercises are a pleasant break in your day. This will make your life better with the exercises acting as a break in routine you can look forward to.  ?Lying on your back, slowly slide your  foot toward your buttocks, raising your knee up off the floor. Then slowly slide your foot back down until your leg is straight again.  ?Lying on your back spread your legs as far apart as you can without causing discomfort.  ?Lying on your side, raise your upper leg and foot straight up from the floor as far as is comfortable. Slowly lower the leg and repeat.  ?Lying on your back, tighten up the muscle in the front of your thigh (quadriceps muscles). You can do this by keeping your leg straight and trying to raise your heel off the floor. This helps strengthen the largest muscle supporting your knee.  ?Lying on your back, tighten up the muscles of your buttocks both with the legs straight and with the knee bent at a comfortable angle while keeping your heel on the floor.  ? ?IF YOU ARE TRANSFERRED TO A SKILLED REHAB FACILITY ?If the patient is transferred to a skilled rehab facility following release from the hospital, a list of the current medications will be sent to the facility for the patient to continue.  When discharged from the skilled rehab facility, please have the facility set up the patient's [place] prior to being released. Also, the skilled facility will be responsible for providing the patient with their medications at time of release from the facility to include their pain medication, the

## 2021-06-12 NOTE — Progress Notes (Addendum)
? ?  Subjective: ?1 Day Post-Op Procedure(s) (LRB): ?TOTAL HIP ARTHROPLASTY ANTERIOR APPROACH (Left) ?Patient reports pain as mild.   ?Patient is well, and has had no acute complaints or problems ?Denies any CP, SOB, ABD pain. ?We will continue therapy today.  ?Plan is to go Home after hospital stay. ? ?Objective: ?Vital signs in last 24 hours: ?Temp:  [97.3 ?F (36.3 ?C)-99.5 ?F (37.5 ?C)] 99.5 ?F (37.5 ?C) (04/19 0400) ?Pulse Rate:  [64-103] 92 (04/19 0400) ?Resp:  [11-21] 20 (04/19 0400) ?BP: (99-147)/(42-98) 145/83 (04/19 0400) ?SpO2:  [94 %-100 %] 99 % (04/19 0400) ?Weight:  [98.2 kg] 98.2 kg (04/18 0830) ? ?Intake/Output from previous day: ?04/18 0701 - 04/19 0700 ?In: 2506.7 [P.O.:360; I.V.:1958.6; IV Piggyback:188.1] ?Out: 1275 [Urine:1075; Blood:200] ?Intake/Output this shift: ?No intake/output data recorded. ? ?Recent Labs  ?  06/11/21 ?2021 06/12/21 ?0413  ?HGB 13.4 11.8*  ? ?Recent Labs  ?  06/11/21 ?2021 06/12/21 ?0413  ?WBC 16.9* 15.7*  ?RBC 4.58 4.11*  ?HCT 41.1 36.9*  ?PLT 190 163  ? ?Recent Labs  ?  06/11/21 ?2021 06/12/21 ?0413  ?NA  --  137  ?K  --  4.3  ?CL  --  106  ?CO2  --  26  ?BUN  --  18  ?CREATININE 1.33* 1.20  ?GLUCOSE  --  150*  ?CALCIUM  --  8.4*  ? ?No results for input(s): LABPT, INR in the last 72 hours. ? ?EXAM ?General - Patient is Alert, Appropriate, and Oriented ?Extremity - Neurovascular intact ?Sensation intact distally ?Intact pulses distally ?Dorsiflexion/Plantar flexion intact ?No cellulitis present ?Compartment soft ?Dressing - dressing C/D/I and no drainage, provena intact with out drainage ?Motor Function - intact, moving foot and toes well on exam.  ? ?Past Medical History:  ?Diagnosis Date  ? Anxiety   ? in small places  ? Arthritis   ? COPD (chronic obstructive pulmonary disease) ([place])   ? Coronary artery disease   ? Hypertension   ? 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 Panic attacks   ? ? ?Assessment/Plan:   ?1 Day Post-Op Procedure(s) (LRB): ?TOTAL HIP ARTHROPLASTY  ANTERIOR APPROACH (Left) ?Principal Problem: ?  Status post total hip replacement, left ? ?Estimated body mass index is 27.06 kg/m? as calculated from the following: ?  Height as of this encounter: '6\' 3"'$  (1.905 m). ?  Weight as of this encounter: 98.2 kg. ?Advance diet ?Up with therapy ?Labs and vital signs are stable ?Pain well controlled ?Care management to assist with discharge to home with home health PT today pending completion of PT goals. ? ?Disposable Prevena not suctioning properly.  Will need to discharge home with honeycomb dressing ? ?Home health nursing to remove foley on 06/17/2021 ?Follow up with Dr. Bernardo Heater ? ? ?DVT Prophylaxis - Lovenox, TED hose, and SCDs ?Weight-Bearing as tolerated to left leg ? ? ?T. Rachelle Hora, PA-C ?[place] ?06/12/2021, 7:21 AM ?  ?

## 2021-06-12 NOTE — Progress Notes (Signed)
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 I notified [employer] to deliver a tall rolling walker to the bedside ?He is set up with [facility] services prior to surgery by surgeons office ? I notified Tommi Rumps of the DC pending for today per patient ?

## 2021-06-12 NOTE — Progress Notes (Signed)
1230 ?DC avs reviewed with pt. Prevena placed. IV removed. Foley intact. Leg bag and foley drainage bag sent with pt. Nurse reviewed medications, post op instructions and foley care. Pt verbalized understanding. Getting dressed awaiting on walker delivery and ride.  ?

## 2021-06-12 NOTE — Progress Notes (Signed)
Physical Therapy Treatment ?Patient Details ?Name: Blake Padilla ?MRN: [id number] ?DOB: [birth year] ?Today's Date: 06/12/2021 ? ? ?History of Present Illness admitted for acute hospitalization s/p L THA, WBAT (06/11/21) ? ?  ?PT Comments  ? ? Pt was long sitting in bed upon arriving. He is A and eager to DC home. Pt was easily and safely able to exit bed, stand, and ambulate with RW. He performed ascending/descending stairs without difficulty. Author recommends HHPT at DC to address deficits while assisting pt to PLOF.  ?  ?Recommendations for follow up therapy are one component of a multi-disciplinary discharge planning process, led by the attending physician.  Recommendations may be updated based on patient status, additional functional criteria and insurance authorization. ? ?Follow Up Recommendations ? Home health PT ?  ?  ?Assistance Recommended at Discharge PRN  ?Patient can return home with the following A little help with walking and/or transfers;A little help with bathing/dressing/bathroom ?  ?Equipment Recommendations ? Rolling walker (2 wheels);BSC/3in1 (pt reports having rollator but no RW.)  ?  ?   ?Precautions / Restrictions Precautions ?Precautions: Fall ?Restrictions ?Weight Bearing Restrictions: Yes ?LLE Weight Bearing: Weight bearing as tolerated  ?  ? ?Mobility ? Bed Mobility ?Overal bed mobility: Needs Assistance ?Bed Mobility: Supine to Sit ?Supine to sit: Supervision ?  ?Transfers ?Overall transfer level: Needs assistance ?Equipment used: Rolling walker (2 wheels) ?Transfers: Sit to/from Stand ?Sit to Stand: Supervision ?  ?  ?Ambulation/Gait ?Ambulation/Gait assistance: Supervision ?Gait Distance (Feet): 200 Feet ?Assistive device: Rolling walker (2 wheels) ?Gait Pattern/deviations: Step-through pattern ?Gait velocity: decreased ?  ?  ?General Gait Details: no LOB or safety concerns. Does require extensive VCs for posture correction ? ? ?Stairs ?Stairs: Yes ?Stairs assistance:  Supervision ?Stair Management: Two rails, Step to pattern ?Number of Stairs: 4 ?General stair comments: pt was safely able to ascend/descend 4 stair without LOB or safety concern. ? ? ? ?  ?Balance Overall balance assessment: Needs assistance ?Sitting-balance support: No upper extremity supported, Feet supported ?Sitting balance-Leahy Scale: Good ?  ?  ?Standing balance support: Bilateral upper extremity supported, During functional activity ?Standing balance-Leahy Scale: Good ?  ?  ?  ?Cognition Arousal/Alertness: Awake/alert ?Behavior During Therapy: [facility] for tasks assessed/performed ?Overall Cognitive Status: Within Functional Limits for tasks assessed ?  ?  ?  ?   ?   ?General Comments General comments (skin integrity, edema, etc.): Author reviewed importance of getting up often at home and performing HEP. he states understanding. Author will return later this date, if pt is still admitted, to perform HEP. ?  ?  ? ?Pertinent Vitals/Pain Pain Assessment ?Pain Assessment: 0-10 ?Pain Score: 3  ?Faces Pain Scale: Hurts a little bit ?Pain Location: L hip ?Pain Descriptors / Indicators: Sore ?Pain Intervention(s): Limited activity within patient's tolerance, Monitored during session, Premedicated before session, Repositioned  ? ? ? ?PT Goals (current goals can now be found in the care plan section) Acute Rehab PT Goals ?Patient Stated Goal: to return home ?Progress towards PT goals: Progressing toward goals ? ?  ?Frequency ? ? ? BID ? ? ? ?  ?PT Plan Current plan remains appropriate  ? ? ?   ?AM-PAC PT "6 Clicks" Mobility   ?Outcome Measure ? Help needed turning from your back to your side while in a flat bed without using bedrails?: None ?Help needed moving from lying on your back to sitting on the side of a flat bed without using bedrails?: None ?Help needed moving to and  from a bed to a chair (including a wheelchair)?: A Little ?Help needed standing up from a chair using your arms (e.g., wheelchair or bedside  chair)?: A Little ?Help needed to walk in hospital room?: A Little ?Help needed climbing 3-5 steps with a railing? : A Little ?6 Click Score: 20 ? ?  ?End of Session   ?Activity Tolerance: Patient tolerated treatment well ?Patient left: in chair;with call bell/phone within reach;with chair alarm set ?Nurse Communication: Mobility status ?PT Visit Diagnosis: Other abnormalities of gait and mobility (R26.89);Pain;Muscle weakness (generalized) (M62.81) ?Pain - Right/Left: Left ?Pain - part of body: Hip ?  ? ? ?Time: 6861-6837 ?PT Time Calculation (min) (ACUTE ONLY): 20 min ? ?Charges:  $Gait Training: 8-22 mins          ?          ? ?Julaine Fusi PTA ?06/12/21, 9:53 AM  ? ?

## 2021-06-12 NOTE — Consult Note (Signed)
? ?Urology Consult ? ? ?History of Present Illness: Blake Padilla is a 71 y.o. male who underwent left total hip arthroplasty under spinal anesthesia today.  Foley catheter was unable to be placed preoperatively and he has not been able to void.  PACU nursing staff unable to place Foley due to severe phimosis. ?Baseline he has no voiding complaints.  No prior urologic history, gross hematuria or bothersome lower urinary tract symptoms. ? ?Past Medical History:  ?Diagnosis Date  ? Anxiety   ? in small places  ? Arthritis   ? COPD (chronic obstructive pulmonary disease) ([place])   ? Coronary artery disease   ? Hypertension   ? Iliac aneurysm ([place]) 01/16/2021  ? Panic attacks   ? ? ?Past Surgical History:  ?Procedure Laterality Date  ? ENDOVASCULAR REPAIR/STENT GRAFT N/A 01/16/2021  ? Procedure: ENDOVASCULAR REPAIR/STENT GRAFT;  Surgeon: Algernon Huxley, MD;  Location: [facility];  Service: Cardiovascular;  Laterality: N/A;  ? ILIAC ARTERY ANEURYSM REPAIR  01/16/2021  ? NO PAST SURGERIES    ? ? ?Home Medications:  ?Current Meds  ?Medication Sig  ? amLODipine (NORVASC) 5 MG tablet Take 1 tablet (5 mg total) by mouth daily.  ? aspirin EC 81 MG EC tablet Take 1 tablet (81 mg total) by mouth daily. Swallow whole.  ? pravastatin (PRAVACHOL) 20 MG tablet Take 1 tablet (20 mg total) by mouth every evening.  ? ? ?Allergies: No Known Allergies ? ?Family History  ?Problem Relation Age of Onset  ? Diabetes Mother   ? Hypertension Mother   ? Diabetes Father   ? Hypertension Father   ? Leukemia Sister   ? Diabetes Brother   ? Dementia Sister   ? COPD Sister   ? Healthy Sister   ? ? ?Social History:  reports that he has been smoking cigarettes. He has a 23.50 pack-year smoking history. He has never used smokeless tobacco. He reports that he does not currently use alcohol. He reports that he does not use drugs. ? ?ROS: ?A complete review of systems was performed.  All systems are negative except for pertinent findings as  noted. ? ?Physical Exam:  ?Vital signs in last 24 hours: ?Temp:  [97.3 ?F (36.3 ?C)-99.5 ?F (37.5 ?C)] 98.8 ?F (37.1 ?C) (04/19 0809) ?Pulse Rate:  [64-103] 94 (04/19 0809) ?Resp:  [11-21] 18 (04/19 0809) ?BP: (99-147)/(42-98) 141/80 (04/19 0809) ?SpO2:  [94 %-100 %] 98 % (04/19 0809) ?Constitutional:  Alert, no acute distress ?GI: Abdomen is soft, nontender, nondistended, no abdominal masses ?GU: Severe phimosis.  Foreskin not retractable and meatus not visualized ? ?A 12 French Foley catheter was inserted through the preputial opening but the urethra unable to be located blindly.  A 16 French catheter was then attempted.  The urethral opening was found end catheter advanced without difficulty. ? ?Laboratory Data:  ?Recent Labs  ?  06/11/21 ?2021 06/12/21 ?0413  ?WBC 16.9* 15.7*  ?HGB 13.4 11.8*  ?HCT 41.1 36.9*  ? ?Recent Labs  ?  06/11/21 ?2021 06/12/21 ?0413  ?NA  --  137  ?K  --  4.3  ?CL  --  106  ?CO2  --  26  ?GLUCOSE  --  150*  ?BUN  --  18  ?CREATININE 1.33* 1.20  ?CALCIUM  --  8.4*  ? ?No results for input(s): LABPT, INR in the last 72 hours. ?No results for input(s): LABURIN in the last 72 hours. ?Results for orders placed or performed during the hospital  encounter of 05/31/21  ?Surgical PCR Screen     Status: None  ? Collection Time: 05/31/21  8:56 AM  ? Specimen: Nasal Mucosa; Nasal Swab  ?Result Value Ref Range Status  ? MRSA, PCR NEGATIVE NEGATIVE Final  ? Staphylococcus aureus NEGATIVE NEGATIVE Final  ?  Comment: (NOTE) ?The Xpert SA Assay (FDA approved for NASAL specimens in patients 21 ?years of age and older), is one component of a comprehensive ?surveillance program. It is not intended to diagnose infection nor to ?guide or monitor treatment. ?Performed at [facility], [place], ?[place] [address] ?  ? ? ? ?Radiologic Imaging: ?DG C-Arm 1-60 Min-No Report ? ?Result Date: 06/11/2021 ?Fluoroscopy was utilized by the requesting physician.  No radiographic interpretation.   ? ?DG C-Arm 1-60 Min-No Report ? ?Result Date: 06/11/2021 ?Fluoroscopy was utilized by the requesting physician.  No radiographic interpretation.  ? ?DG HIP UNILAT WITH PELVIS 1V LEFT ? ?Result Date: 06/11/2021 ?CLINICAL DATA:  Left hip arthroplasty EXAM: DG HIP (WITH OR WITHOUT PELVIS) 1V*L* COMPARISON:  None. FINDINGS: Five intraoperative views. These demonstrate placement of a left arthroplasty, without acute hardware complication. IMPRESSION: Intraoperative imaging of left hip arthroplasty. Electronically Signed   By: Abigail Miyamoto M.D.   On: 06/11/2021 11:48  ? ?DG HIP UNILAT W OR W/O PELVIS 2-3 VIEWS LEFT ? ?Result Date: 06/11/2021 ?CLINICAL DATA:  Status post total left hip arthroplasty. EXAM: DG HIP (WITH OR WITHOUT PELVIS) 2-3V LEFT COMPARISON:  Left hip fluoroscopy images 06/11/2021 FINDINGS: Interval total left hip arthroplasty. No perihardware lucency is seen to indicate hardware failure or loosening. Expected postoperative changes including lateral left hip soft tissue swelling and subcutaneous air. Lateral surgical skin staples and soft tissue drainage catheter. No acute fracture. Partial visualization of bi-iliac vascular stents and vascular coils overlying left sacrum. IMPRESSION: Status post total left hip arthroplasty without evidence of hardware failure. Electronically Signed   By: Yvonne Kendall M.D.   On: 06/11/2021 12:31   ? ?Impression/Assessment:  ?Severe phimosis ?Urinary retention secondary to spinal anesthesia ? ?Plan:  ?Indwelling Foley catheter x48-72 hours ?Recommend office follow-up to discuss treatment options for his phimosis ? ? ?06/12/2021, 8:38 AM  ?Blake Giovanni,  MD ? ? ? ? ?

## 2021-06-12 NOTE — Discharge Summary (Signed)
?Physician Discharge Summary  ?Patient ID: ?Blake Padilla ?MRN: [id number] ?DOB/AGE: September 14, 1950 71 y.o. ? ?Admit date: 06/11/2021 ?Discharge date: 06/12/2021 ? ?Admission Diagnoses:  ?Status post total hip replacement, left [Z96.642] ? ? ?Discharge Diagnoses: ?Patient Active Problem List  ? Diagnosis Date Noted  ? Status post total hip replacement, left 06/11/2021  ? 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 Hyperlipidemia 12/18/2020  ? Personal history of tobacco use, presenting hazards to health 03/02/2018  ? ? ?Past Medical History:  ?Diagnosis Date  ? Anxiety   ? in small places  ? Arthritis   ? COPD (chronic obstructive pulmonary disease) ([place])   ? Coronary artery disease   ? Hypertension   ? 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 Panic attacks   ? ?  ?Transfusion: None ?  ?Consultants (if any):  ? ?Discharged Condition: Improved ? ?Hospital Course: JEANCLAUDE WENTWORTH is an 71 y.o. male who was admitted 06/11/2021 with a diagnosis of Status post total hip replacement, left and went to the operating room on 06/11/2021 and underwent the above named procedures.  ?  ?Surgeries: Procedure(s): ?TOTAL HIP ARTHROPLASTY ANTERIOR APPROACH on 06/11/2021 ?Patient tolerated the surgery well. Taken to PACU where she was stabilized and then transferred to the orthopedic floor. ? ?Started on Lovenox 40 mg q 24 hrs. Foot pumps applied bilaterally at 80 mm. Heels elevated on bed with rolled towels. No evidence of DVT. Negative Homan. ?Physical therapy started on day #1 for gait training and transfer. OT started day #1 for ADL and assisted devices. Catheter placed by urology and recommended continuing with foley x 1 week.  Foley catheter to be removed on 06/16/2021 by home health agency and patient will follow back up with urology ? ? Patient's IV was d/c on day #1. ? ?On post op day #1 patient was stable and ready for discharge to home with home health PT. ? ? ? ?He was given perioperative antibiotics:   ?Anti-infectives (From admission, onward)  ? ? Start     Dose/Rate Route Frequency Ordered Stop  ? 06/11/21 1700  ceFAZolin (ANCEF) IVPB 2g/100 mL premix       ? 2 g ?200 mL/hr over 30 Minutes Intravenous Every 6 hours 06/11/21 1556 06/11/21 2305  ? 06/11/21 0822  ceFAZolin (ANCEF) 2-4 GM/100ML-% IVPB       ?Note to Pharmacy: Doreen Salvage J: cabinet override  ?    06/11/21 0822 06/11/21 1901  ? 06/11/21 0600  ceFAZolin (ANCEF) IVPB 2g/100 mL premix       ? 2 g ?200 mL/hr over 30 Minutes Intravenous On call to O.R. 06/11/21 0323 06/11/21 1012  ? ?  ?. ? ?He was given sequential compression devices, early ambulation, and Lovenox, teds for DVT prophylaxis. ? ?He benefited maximally from the hospital stay and there were no complications.   ? ?Recent vital signs:  ?Vitals:  ? 06/11/21 2045 06/12/21 0400  ?BP: (!) 147/92 (!) 145/83  ?Pulse: 98 92  ?Resp:  20  ?Temp: 98.9 ?F (37.2 ?C) 99.5 ?F (37.5 ?C)  ?SpO2: 100% 99%  ? ? ?Recent laboratory studies:  ?Lab Results  ?Component Value Date  ? HGB 11.8 (L) 06/12/2021  ? HGB 13.4 06/11/2021  ? HGB 14.4 05/31/2021  ? ?Lab Results  ?Component Value Date  ? WBC 15.7 (H) 06/12/2021  ? PLT 163 06/12/2021  ? ?No results found for: INR ?Lab Results  ?Component Value Date  ? NA 137 06/12/2021  ?  K 4.3 06/12/2021  ? CL 106 06/12/2021  ? CO2 26 06/12/2021  ? BUN 18 06/12/2021  ? CREATININE 1.20 06/12/2021  ? GLUCOSE 150 (H) 06/12/2021  ? ? ?Discharge Medications:   ?Allergies as of 06/12/2021   ?No Known Allergies ?  ? ?  ?Medication List  ?  ? ?TAKE these medications   ? ?amLODipine 5 MG tablet ?Commonly known as: NORVASC ?Take 1 tablet (5 mg total) by mouth daily. ?  ?aspirin 81 MG EC tablet ?Take 1 tablet (81 mg total) by mouth daily. Swallow whole. ?  ?docusate sodium 100 MG capsule ?Commonly known as: COLACE ?Take 1 capsule (100 mg total) by mouth 2 (two) times daily. ?  ?enoxaparin 40 MG/0.4ML injection ?Commonly known as: LOVENOX ?Inject 0.4 mLs (40 mg total) into the skin daily  for 14 days. ?  ?ezetimibe 10 MG tablet ?Commonly known as: ZETIA ?Take 1 tablet (10 mg total) by mouth daily. ?  ?HYDROcodone-acetaminophen 7.5-325 MG tablet ?Commonly known as: NORCO ?Take 1-2 tablets by mouth every 4 (four) hours as needed for severe pain (pain score 7-10). ?  ?methocarbamol 500 MG tablet ?Commonly known as: ROBAXIN ?Take 1 tablet (500 mg total) by mouth every 6 (six) hours as needed for muscle spasms. ?  ?pravastatin 20 MG tablet ?Commonly known as: PRAVACHOL ?Take 1 tablet (20 mg total) by mouth every evening. ?  ?traMADol 50 MG tablet ?Commonly known as: ULTRAM ?Take 1 tablet (50 mg total) by mouth every 6 (six) hours as needed. ?  ? ?  ? ?  ?  ? ? ?  ?Durable Medical Equipment  ?(From admission, onward)  ?  ? ? ?  ? ?  Start     Ordered  ? 06/11/21 1557  DME Walker rolling  Once       ?Question Answer Comment  ?Walker: With 5 Inch Wheels   ?Patient needs a walker to treat with the following condition Status post total hip replacement, left   ?  ? 06/11/21 1556  ? 06/11/21 1557  DME 3 n 1  Once       ? 06/11/21 1556  ? 06/11/21 1557  DME Bedside commode  Once       ?Question:  Patient needs a bedside commode to treat with the following condition  Answer:  Status post total hip replacement, left  ? 06/11/21 1556  ? ?  ?  ? ?  ? ? ?Diagnostic Studies: DG C-Arm 1-60 Min-No Report ? ?Result Date: 06/11/2021 ?Fluoroscopy was utilized by the requesting physician.  No radiographic interpretation.  ? ?DG C-Arm 1-60 Min-No Report ? ?Result Date: 06/11/2021 ?Fluoroscopy was utilized by the requesting physician.  No radiographic interpretation.  ? ?CT CHEST LUNG CA SCREEN LOW DOSE W/O CM ? ?Result Date: 05/22/2021 ?CLINICAL DATA:  Fifty pack-year smoking history-current smoker EXAM: CT CHEST WITHOUT CONTRAST LOW-DOSE FOR LUNG CANCER SCREENING TECHNIQUE: Multidetector CT imaging of the chest was performed following the standard protocol without IV contrast. RADIATION DOSE REDUCTION: This exam was performed  according to the departmental dose-optimization program which includes automated exposure control, adjustment of the mA and/or kV according to patient size and/or use of iterative reconstruction technique. COMPARISON:  03/15/2020 FINDINGS: Cardiovascular: Aortic atherosclerosis. Normal heart size, without pericardial effusion. Lad coronary artery calcification. Mediastinum/Nodes: No mediastinal or definite hilar adenopathy, given limitations of unenhanced CT. Subtle fluid level in the esophagus on 27/2. Lungs/Pleura: No pleural fluid. Mild centrilobular and paraseptal emphysema. Pulmonary nodules of maximally volume  derived equivalent diameter 4.9 mm, similar. Upper Abdomen: Normal imaged portions of the liver, spleen, stomach, pancreas, gallbladder, right adrenal gland, kidneys. 1.7 cm low-density left adrenal nodule is most consistent with an adenoma . No follow-up necessary. Musculoskeletal: No acute osseous abnormality. Accentuation of expected thoracic kyphosis. IMPRESSION: 1. Lung-RADS 2, benign appearance or behavior. Continue annual screening with low-dose chest CT without contrast in 12 months. 2. Esophageal air fluid level suggests dysmotility or gastroesophageal reflux. 3. Aortic Atherosclerosis (ICD10-I70.0) and Emphysema (ICD10-J43.9). Coronary artery atherosclerosis. 4. Left adrenal adenoma. Electronically Signed   By: Abigail Miyamoto M.D.   On: 05/22/2021 16:51 ? ?DG HIP UNILAT WITH PELVIS 1V LEFT ? ?Result Date: 06/11/2021 ?CLINICAL DATA:  Left hip arthroplasty EXAM: DG HIP (WITH OR WITHOUT PELVIS) 1V*L* COMPARISON:  None. FINDINGS: Five intraoperative views. These demonstrate placement of a left arthroplasty, without acute hardware complication. IMPRESSION: Intraoperative imaging of left hip arthroplasty. Electronically Signed   By: Abigail Miyamoto M.D.   On: 06/11/2021 11:48  ? ?DG HIP UNILAT W OR W/O PELVIS 2-3 VIEWS LEFT ? ?Result Date: 06/11/2021 ?CLINICAL DATA:  Status post total left hip arthroplasty.  EXAM: DG HIP (WITH OR WITHOUT PELVIS) 2-3V LEFT COMPARISON:  Left hip fluoroscopy images 06/11/2021 FINDINGS: Interval total left hip arthroplasty. No perihardware lucency is seen to indicate hardwa

## 2021-06-12 NOTE — Evaluation (Signed)
Occupational Therapy Evaluation ?Patient Details ?Name: Blake Padilla ?MRN: [id number] ?DOB: 05-21-1950 ?Today's Date: 06/12/2021 ? ? ?History of Present Illness admitted for acute hospitalization s/p L THA, WBAT (06/11/21)  ? ?Clinical Impression ?  ?Blake Padilla was seen for OT evaluation this date, POD#1 from above surgery. Pt was independent in all ADLs prior to surgery. Pt requires MIN A don L sock and MAX A don B compression socks. SUPERVISION + RW for ADL t/f and standing grooming tasks. Pt instructed falls prevention strategies, DME/AE for LB bathing and dressing tasks, and compression stocking mgt strategies. All education complete, no skilled acute OT needs identified, will sign off. Anticipate no follow up OT needs.  ?   ? ?Recommendations for follow up therapy are one component of a multi-disciplinary discharge planning process, led by the attending physician.  Recommendations may be updated based on patient status, additional functional criteria and insurance authorization.  ? ?Follow Up Recommendations ? No OT follow up  ?  ?Assistance Recommended at Discharge Intermittent Supervision/Assistance  ?Patient can return home with the following A little help with bathing/dressing/bathroom;Help with stairs or ramp for entrance ? ?  ?Functional Status Assessment ? Patient has had a recent decline in their functional status and demonstrates the ability to make significant improvements in function in a reasonable and predictable amount of time.  ?Equipment Recommendations ? BSC/3in1  ?  ?Recommendations for Other Services   ? ? ?  ?Precautions / Restrictions Precautions ?Precautions: Fall ?Restrictions ?Weight Bearing Restrictions: Yes ?LLE Weight Bearing: Weight bearing as tolerated  ? ?  ? ?Mobility Bed Mobility ?  ?  ?  ?  ?  ?  ?  ?General bed mobility comments: received and left sitting ?  ? ?Transfers ?Overall transfer level: Needs assistance ?Equipment used: Rolling walker (2 wheels) ?Transfers: Sit to/from  Stand ?Sit to Stand: Supervision ?  ?  ?  ?  ?  ?  ?  ? ?  ?Balance Overall balance assessment: Needs assistance ?Sitting-balance support: No upper extremity supported, Feet supported ?Sitting balance-Leahy Scale: Good ?  ?  ?Standing balance support: No upper extremity supported, During functional activity ?Standing balance-Leahy Scale: Good ?  ?  ?  ?  ?  ?  ?  ?  ?  ?  ?  ?  ?   ? ?ADL either performed or assessed with clinical judgement  ? ?ADL Overall ADL's : Needs assistance/impaired ?  ?  ?  ?  ?  ?  ?  ?  ?  ?  ?  ?  ?  ?  ?  ?  ?  ?  ?  ?General ADL Comments: MIN A don L sock and MAX A don B compression socks. SUPERVISION + RW for ADL t/f and standing grooming tasks  ? ? ? ? ?Pertinent Vitals/Pain Pain Assessment ?Pain Assessment: 0-10 ?Pain Score: 4  ?Pain Location: L hip ?Pain Descriptors / Indicators: Sore ?Pain Intervention(s): Limited activity within patient's tolerance, Repositioned  ? ? ? ?Hand Dominance   ?  ?Extremity/Trunk Assessment Upper Extremity Assessment ?Upper Extremity Assessment: Overall WFL for tasks assessed ?  ?Lower Extremity Assessment ?Lower Extremity Assessment: Generalized weakness ?  ?  ?  ?Communication Communication ?Communication: No difficulties ?  ?Cognition Arousal/Alertness: Awake/alert ?Behavior During Therapy: [facility] for tasks assessed/performed ?Overall Cognitive Status: Within Functional Limits for tasks assessed ?  ?  ?  ?  ?  ?  ?  ?  ?  ?  ?  ?  ?  ?  ?  ?  ?  ?  ?  ? ?  Home Living Family/patient expects to be discharged to:: Private residence ?Living Arrangements: Spouse/significant other ?Available Help at Discharge: Family;Available PRN/intermittently ?Type of Home: House ?Home Access: Stairs to enter ?Entrance Stairs-Number of Steps: 6 ?Entrance Stairs-Rails: Right;Can reach both;Left ?Home Layout: One level ?  ?  ?Bathroom Shower/Tub: Gaffer;Tub/shower unit ?  ?  ?  ?  ?Home Equipment: Rolling Walker (2 wheels);Grab bars - tub/shower ?  ?  ?  ? ?  ?Prior  Functioning/Environment Prior Level of Function : Independent/Modified Independent ?  ?  ?  ?  ?  ?  ?  ?  ?  ? ?  ?  ?OT Problem List: Decreased strength;Decreased range of motion;Decreased activity tolerance;Impaired balance (sitting and/or standing);Decreased safety awareness ?  ?   ?   ?OT Goals(Current goals can be found in the care plan section) Acute Rehab OT Goals ?Patient Stated Goal: to go home ?OT Goal Formulation: With patient ?Time For Goal Achievement: 06/26/21 ?Potential to Achieve Goals: Good  ? ?AM-PAC OT "6 Clicks" Daily Activity     ?Outcome Measure Help from another person eating meals?: None ?Help from another person taking care of personal grooming?: A Little ?Help from another person toileting, which includes using toliet, bedpan, or urinal?: A Little ?Help from another person bathing (including washing, rinsing, drying)?: A Little ?Help from another person to put on and taking off regular upper body clothing?: None ?Help from another person to put on and taking off regular lower body clothing?: A Little ?6 Click Score: 20 ?  ?End of Session   ? ?Activity Tolerance: Patient tolerated treatment well ?Patient left: in chair;with call bell/phone within reach;with chair alarm set ? ?OT Visit Diagnosis: Unsteadiness on feet (R26.81)  ?              ?Time: 7017-7939 ?OT Time Calculation (min): 14 min ?Charges:  OT General Charges ?$OT Visit: 1 Visit ?OT Evaluation ?$OT Eval Low Complexity: 1 Low ? ?Dessie Coma, M.S. OTR/L  ?06/12/21, 10:12 AM  ?ascom [phone] ?

## 2021-06-12 NOTE — Plan of Care (Signed)

## 2021-06-13 LAB — SURGICAL PATHOLOGY

## 2021-06-26 ENCOUNTER — Ambulatory Visit: Payer: Medicare HMO | Admitting: Physician Assistant

## 2021-06-26 ENCOUNTER — Ambulatory Visit: Payer: Self-pay | Admitting: Physician Assistant

## 2021-06-26 DIAGNOSIS — N471 Phimosis: Secondary | ICD-10-CM

## 2021-06-26 DIAGNOSIS — R339 Retention of urine, unspecified: Secondary | ICD-10-CM

## 2021-06-26 LAB — BLADDER SCAN AMB NON-IMAGING

## 2021-06-26 MED ORDER — BETAMETHASONE DIPROPIONATE 0.05 % EX CREA
TOPICAL_CREAM | Freq: Two times a day (BID) | CUTANEOUS | 0 refills | Status: DC
Start: 2021-06-26 — End: 2023-09-10

## 2021-06-26 NOTE — Progress Notes (Signed)
Catheter Removal ? ?Patient is present today for a catheter removal.  48m of water was drained from the balloon. A 16FR foley cath was removed from the bladder no complications were noted . Patient tolerated well. ? ?Performed by: SFonnie Jarvis CMA ? ?Follow up/ Additional notes: patient will return this afternoon  ?

## 2021-06-26 NOTE — Progress Notes (Signed)
? ?06/26/2021 ?3:29 PM  ? ?Alverda Skeans ?[birth year] ?[id number] ? ?CC: ?Chief Complaint  ?Patient presents with  ? Urinary Retention  ?  V &T  ? ?HPI: ?Blake Padilla is a 71 y.o. male who developed postoperative urinary retention following recent total left hip arthroplasty with difficult Foley placement due to severe phimosis who presents today for voiding trial.  ? ?Today he reports his foreskin used to be rather tight and irritating for him, however it has loosened up over time and he now has minimal bother with this.  He is hesitant to pursue procedural intervention for his phimosis.  He states he is able to retract the foreskin and that it never gets trapped behind the head of the penis. ? ?Foley catheter removed in clinic in the morning, see separate procedure note for details.  He returned to clinic in the afternoon for PVR.  He reports he has been able to urinate without difficulty or dysuria.  PVR 150 mL. ? ?PMH: ?Past Medical History:  ?Diagnosis Date  ? Anxiety   ? in small places  ? Arthritis   ? COPD (chronic obstructive pulmonary disease) ([place])   ? Coronary artery disease   ? Hypertension   ? 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 Panic attacks   ? ? ?Surgical History: ?Past Surgical History:  ?Procedure Laterality Date  ? ENDOVASCULAR REPAIR/STENT GRAFT N/A 01/16/2021  ? Procedure: ENDOVASCULAR REPAIR/STENT GRAFT;  Surgeon: Algernon Huxley, MD;  Location: [facility];  Service: Cardiovascular;  Laterality: N/A;  ? ILIAC ARTERY ANEURYSM REPAIR  01/16/2021  ? NO PAST SURGERIES    ? TOTAL HIP ARTHROPLASTY Left 06/11/2021  ? Procedure: TOTAL HIP ARTHROPLASTY ANTERIOR APPROACH;  Surgeon: Hessie Knows, MD;  Location: [facility];  Service: Orthopedics;  Laterality: Left;  ? ? ?Home Medications:  ?Allergies as of 06/26/2021   ?No Known Allergies ?  ? ?  ?Medication List  ?  ? ?  ? Accurate as of Jun 26, 2021  3:29 PM. If you have any questions, ask your nurse or doctor.  ?  ?  ? ?  ? ?amLODipine 5 MG  tablet ?Commonly known as: NORVASC ?Take 1 tablet (5 mg total) by mouth daily. ?  ?aspirin 81 MG EC tablet ?Take 1 tablet (81 mg total) by mouth daily. Swallow whole. ?  ?betamethasone dipropionate 0.05 % cream ?Apply topically 2 (two) times daily. Apply for 4-6 weeks, not to exceed 6 weeks. ?  ?docusate sodium 100 MG capsule ?Commonly known as: COLACE ?Take 1 capsule (100 mg total) by mouth 2 (two) times daily. ?  ?enoxaparin 40 MG/0.4ML injection ?Commonly known as: LOVENOX ?Inject 0.4 mLs (40 mg total) into the skin daily for 14 days. ?  ?ezetimibe 10 MG tablet ?Commonly known as: ZETIA ?Take 1 tablet (10 mg total) by mouth daily. ?  ?HYDROcodone-acetaminophen 7.5-325 MG tablet ?Commonly known as: NORCO ?Take 1-2 tablets by mouth every 4 (four) hours as needed for severe pain (pain score 7-10). ?  ?methocarbamol 500 MG tablet ?Commonly known as: ROBAXIN ?Take 1 tablet (500 mg total) by mouth every 6 (six) hours as needed for muscle spasms. ?  ?pravastatin 20 MG tablet ?Commonly known as: PRAVACHOL ?Take 1 tablet (20 mg total) by mouth every evening. ?  ?traMADol 50 MG tablet ?Commonly known as: ULTRAM ?Take 1 tablet (50 mg total) by mouth every 6 (six) hours as needed. ?  ? ?  ? ? ?Allergies:  ?No Known Allergies ? ?  Family History: ?Family History  ?Problem Relation Age of Onset  ? Diabetes Mother   ? Hypertension Mother   ? Diabetes Father   ? Hypertension Father   ? Leukemia Sister   ? Diabetes Brother   ? Dementia Sister   ? COPD Sister   ? Healthy Sister   ? ? ?Social History:  ? reports that he has been smoking cigarettes. He has a 23.50 pack-year smoking history. He has never used smokeless tobacco. He reports that he does not currently use alcohol. He reports that he does not use drugs. ? ?Physical Exam: ?There were no vitals taken for this visit.  ?Constitutional:  Alert and oriented, no acute distress, nontoxic appearing ?HEENT: [place], AT ?Cardiovascular: No clubbing, cyanosis, or edema ?Respiratory: Normal  respiratory effort, no increased work of breathing ?Skin: No rashes, bruises or suspicious lesions ?Neurologic: Grossly intact, no focal deficits, moving all 4 extremities ?Psychiatric: Normal mood and affect ? ?Laboratory Data: ?Results for orders placed or performed in visit on 06/26/21  ?BLADDER SCAN AMB NON-IMAGING  ?Result Value Ref Range  ? Scan Result 142m   ? ?Assessment & Plan:   ?1. Urinary retention ?Resolved.  Counseled patient to return to clinic or proceed to the emergency department if recurrent.  He expressed understanding. ?- BLADDER SCAN AMB NON-IMAGING ? ?2. Phimosis ?Patient reports minimal bother.  We discussed various treatment options including topical steroids, circumcision, or dorsal slit.  He is hesitant to pursue circumcision or dorsal slit, but is willing to try topical steroids.  Betamethasone prescription sent to his pharmacy. ? ?Okay to follow-up as needed if he changes his mind about pursuing procedural intervention for his phimosis.  We discussed that paraphimosis is a urologic emergency and if he ever is unable to reduce the foreskin, he should seek emergency medical attention.  He expressed understanding. ?- betamethasone dipropionate 0.05 % cream; Apply topically 2 (two) times daily. Apply for 4-6 weeks, not to exceed 6 weeks.  Dispense: 45 g; Refill: 0 ? ?Return if symptoms worsen or fail to improve. ? ?SDebroah Loop PA-C ? ?[facility]?[address] ?[place] [address]?[phone]?

## 2021-06-27 ENCOUNTER — Ambulatory Visit: Payer: Self-pay | Admitting: Physician Assistant

## 2021-06-27 ENCOUNTER — Ambulatory Visit: Payer: Self-pay | Admitting: Urology

## 2021-09-20 ENCOUNTER — Ambulatory Visit (INDEPENDENT_AMBULATORY_CARE_PROVIDER_SITE_OTHER): Payer: Medicare HMO | Admitting: Vascular Surgery

## 2021-09-20 ENCOUNTER — Other Ambulatory Visit (INDEPENDENT_AMBULATORY_CARE_PROVIDER_SITE_OTHER): Payer: Medicare HMO

## 2021-10-08 ENCOUNTER — Ambulatory Visit (INDEPENDENT_AMBULATORY_CARE_PROVIDER_SITE_OTHER): Payer: Medicare HMO | Admitting: Vascular Surgery

## 2021-10-08 ENCOUNTER — Encounter (INDEPENDENT_AMBULATORY_CARE_PROVIDER_SITE_OTHER): Payer: Self-pay | Admitting: Vascular Surgery

## 2021-10-08 ENCOUNTER — Ambulatory Visit (INDEPENDENT_AMBULATORY_CARE_PROVIDER_SITE_OTHER): Payer: Medicare HMO

## 2021-10-08 VITALS — BP 132/64 | HR 71 | Resp 17 | Ht 75.0 in | Wt 211.0 lb

## 2021-10-08 DIAGNOSIS — I723 Aneurysm of iliac artery: Secondary | ICD-10-CM

## 2021-10-08 DIAGNOSIS — E785 Hyperlipidemia, unspecified: Secondary | ICD-10-CM | POA: Diagnosis not present

## 2021-10-08 NOTE — Addendum Note (Signed)
Addended by: Algernon Huxley on: 10/08/2021 09:13 AM   Modules accepted: Orders

## 2021-10-08 NOTE — Assessment & Plan Note (Signed)
Duplex today shows a maximal size of the aortic portion of the aneurysm sac to be 4.0 cm with a patent stent graft without endoleak.  He is doing well about 9 months status post endovascular repair.  Continue current medical regimen.  Recheck in 6 months with duplex and if things are stable, we can then likely go to an annual visit following this.

## 2021-10-08 NOTE — Progress Notes (Signed)
MRN : [id number]  Blake Padilla is a 71 y.o. (1950/06/16) male who presents with chief complaint of No chief complaint on file. Marland Kitchen  History of Present Illness: Patient returns today in follow up of his aneurysm.  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  He is doing well.  He denies any aneurysm related symptoms.  Specifically, the patient denies new back or abdominal pain, or signs of peripheral embolization. He did have hip replacement surgery about 3-4 months ago and is still recovering from this. Duplex today shows a maximal size of the aortic portion of the aneurysm sac to be 4.0 cm with a patent stent graft without endoleak.  Current Outpatient Medications  Medication Sig Dispense Refill   aspirin EC 81 MG EC tablet Take 1 tablet (81 mg total) by mouth daily. Swallow whole. 30 tablet 11   betamethasone dipropionate 0.05 % cream Apply topically 2 (two) times daily. Apply for 4-6 weeks, not to exceed 6 weeks. 45 g 0   citalopram (CELEXA) 10 MG tablet Take by mouth.     ezetimibe (ZETIA) 10 MG tablet Take by mouth.     pravastatin (PRAVACHOL) 20 MG tablet Take 1 tablet (20 mg total) by mouth every evening. 1 tablet 3   amLODipine (NORVASC) 5 MG tablet Take 1 tablet (5 mg total) by mouth daily. 30 tablet 3   enoxaparin (LOVENOX) 40 MG/0.4ML injection Inject 0.4 mLs (40 mg total) into the skin daily for 14 days. 5.6 mL 0   ezetimibe (ZETIA) 10 MG tablet Take 1 tablet (10 mg total) by mouth daily. 90 tablet 3   No current facility-administered medications for this visit.    Past Medical History:  Diagnosis Date   Anxiety    in small places   Arthritis    COPD (chronic obstructive pulmonary disease) ([place])    Coronary artery disease    Hypertension    Iliac aneurysm ([place]) 01/16/2021   Panic attacks     Past Surgical History:  Procedure Laterality Date   ENDOVASCULAR REPAIR/STENT GRAFT N/A 01/16/2021   Procedure: ENDOVASCULAR  REPAIR/STENT GRAFT;  Surgeon: Algernon Huxley, MD;  Location: [facility];  Service: Cardiovascular;  Laterality: N/A;   ILIAC ARTERY ANEURYSM REPAIR  01/16/2021   NO PAST SURGERIES     TOTAL HIP ARTHROPLASTY Left 06/11/2021   Procedure: TOTAL HIP ARTHROPLASTY ANTERIOR APPROACH;  Surgeon: Hessie Knows, MD;  Location: [facility];  Service: Orthopedics;  Laterality: Left;     Social History   Tobacco Use   Smoking status: Every Day    Packs/day: 0.50    Years: 47.00    Total pack years: 23.50    Types: Cigarettes   Smokeless tobacco: Never  Vaping Use   Vaping Use: Never used  Substance Use Topics   Alcohol use: Not Currently    Comment: with colds   Drug use: Never      Family History  Problem Relation Age of Onset   Diabetes Mother    Hypertension Mother    Diabetes Father    Hypertension Father    Leukemia Sister    Diabetes Brother    Dementia Sister    COPD Sister    Healthy Sister      No Known Allergies   REVIEW OF SYSTEMS (Negative unless checked)  Constitutional: '[]'$ Weight loss  '[]'$ Fever  '[]'$ Chills Cardiac: '[]'$ Chest pain   '[]'$ Chest pressure   '[]'$ Palpitations   '[]'$   Shortness of breath when laying flat   '[]'$ Shortness of breath at rest   '[]'$ Shortness of breath with exertion. Vascular:  '[]'$ Pain in legs with walking   '[]'$ Pain in legs at rest   '[]'$ Pain in legs when laying flat   '[]'$ Claudication   '[]'$ Pain in feet when walking  '[]'$ Pain in feet at rest  '[]'$ Pain in feet when laying flat   '[]'$ History of DVT   '[]'$ Phlebitis   '[]'$ Swelling in legs   '[]'$ Varicose veins   '[]'$ Non-healing ulcers Pulmonary:   '[]'$ Uses home oxygen   '[]'$ Productive cough   '[]'$ Hemoptysis   '[]'$ Wheeze  '[]'$ COPD   '[]'$ Asthma Neurologic:  '[]'$ Dizziness  '[]'$ Blackouts   '[]'$ Seizures   '[]'$ History of stroke   '[]'$ History of TIA  '[]'$ Aphasia   '[]'$ Temporary blindness   '[]'$ Dysphagia   '[]'$ Weakness or numbness in arms   '[x]'$ Weakness or numbness in legs Musculoskeletal:  '[x]'$ Arthritis   '[]'$ Joint swelling   '[x]'$ Joint pain   '[]'$ Low back pain Hematologic:   '[]'$ Easy bruising  '[]'$ Easy bleeding   '[]'$ Hypercoagulable state   '[]'$ Anemic   Gastrointestinal:  '[]'$ Blood in stool   '[]'$ Vomiting blood  '[]'$ Gastroesophageal reflux/heartburn   '[]'$ Abdominal pain Genitourinary:  '[]'$ Chronic kidney disease   '[]'$ Difficult urination  '[]'$ Frequent urination  '[]'$ Burning with urination   '[]'$ Hematuria Skin:  '[]'$ Rashes   '[]'$ Ulcers   '[]'$ Wounds Psychological:  '[]'$ History of anxiety   '[]'$  History of major depression.  Physical Examination  BP 132/64 (BP Location: Left Arm)   Pulse 71   Resp 17   Ht '6\' 3"'$  (1.905 m)   Wt 211 lb (95.7 kg)   BMI 26.37 kg/m  Gen:  WD/WN, Tall, NAD. Appears younger than stated age. Head: [place]/AT, No temporalis wasting. Ear/Nose/Throat: Hearing grossly intact, nares w/o erythema or drainage Eyes: Conjunctiva clear. Sclera non-icteric Neck: Supple.  Trachea midline Pulmonary:  Good air movement, no use of accessory muscles.  Cardiac: RRR, no JVD Vascular:  Vessel Right Left  Radial Palpable Palpable                                   Gastrointestinal: soft, non-tender/non-distended. No guarding/reflex.  Musculoskeletal: M/S 5/5 throughout.  No deformity or atrophy. No edema. Neurologic: Sensation grossly intact in extremities.  Symmetrical.  Speech is fluent.  Psychiatric: Judgment intact, Mood & affect appropriate for pt's clinical situation. Dermatologic: No rashes or ulcers noted.  No cellulitis or open wounds.      Labs No results found for this or any previous visit (from the past 2160 hour(s)).  Radiology No results found.  Assessment/Plan Hyperlipidemia lipid control important in reducing the progression of atherosclerotic disease. Continue statin therapy  Iliac aneurysm (HCC) Duplex today shows a maximal size of the aortic portion of the aneurysm sac to be 4.0 cm with a patent stent graft without endoleak.  He is doing well about 9 months status post endovascular repair.  Continue current medical regimen.  Recheck in 6 months with  duplex and if things are stable, we can then likely go to an annual visit following this.    [name], MD  10/08/2021 9:02 AM    This note was created with Dragon medical transcription system.  Any errors from dictation are purely unintentional

## 2021-10-29 ENCOUNTER — Encounter: Payer: Self-pay | Admitting: Cardiology

## 2021-10-29 ENCOUNTER — Ambulatory Visit: Payer: Medicare HMO | Attending: Cardiology | Admitting: Cardiology

## 2021-10-29 VITALS — BP 122/78 | HR 68 | Ht 75.0 in | Wt 210.0 lb

## 2021-10-29 DIAGNOSIS — F17200 Nicotine dependence, unspecified, uncomplicated: Secondary | ICD-10-CM | POA: Diagnosis not present

## 2021-10-29 DIAGNOSIS — E785 Hyperlipidemia, unspecified: Secondary | ICD-10-CM | POA: Diagnosis not present

## 2021-10-29 DIAGNOSIS — I10 Essential (primary) hypertension: Secondary | ICD-10-CM

## 2021-10-29 DIAGNOSIS — I2510 Atherosclerotic heart disease of native coronary artery without angina pectoris: Secondary | ICD-10-CM

## 2021-10-29 NOTE — Patient Instructions (Signed)
Medication Instructions:   Your physician recommends that you continue on your current medications as directed. Please refer to the Current Medication list given to you today.  *If you need a refill on your cardiac medications before your next appointment, please call your pharmacy*   Follow-Up: At [facility], you and your health needs are our priority.  As part of our continuing mission to provide you with exceptional heart care, we have created designated Provider Care Teams.  These Care Teams include your primary Cardiologist (physician) and Advanced Practice Providers (APPs -  Physician Assistants and Nurse Practitioners) who all work together to provide you with the care you need, when you need it.  We recommend signing up for the patient portal called "MyChart".  Sign up information is provided on this After Visit Summary.  MyChart is used to connect with patients for Virtual Visits (Telemedicine).  Patients are able to view lab/test results, encounter notes, upcoming appointments, etc.  Non-urgent messages can be sent to your provider as well.   To learn more about what you can do with MyChart, go to [email or web address].    Your next appointment:   1 year(s)  The format for your next appointment:   In Person  Provider:   Brian Agbor-Etang, MD    Other Instructions   Important Information About Sugar       

## 2021-10-29 NOTE — Progress Notes (Signed)
Cardiology Office Note:    Date:  10/29/2021   ID:  Blake Padilla, DOB Sep 09, 1950, MRN [id number]  PCP:  Marguerita Merles, MD  [facility] Cardiologist:  Kate Sable, MD  [facility] Electrophysiologist:  None   Referring MD: Marguerita Merles, MD   Chief Complaint  Patient presents with   Follow-up    6 Month Follow up, No new Cardiac concerns     History of Present Illness:    Blake Padilla is a 71 y.o. 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   Denies chest pain or shortness of breath, tolerating amlodipine for BP.  Blood pressure has been well controlled.  Still smokes, has cut back, working on quitting.  Compliant with medications as prescribed, feels great, denies any concerns at this time.   Prior notes Echocardiogram 03/2020 EF 60 to 65%, impaired relaxation. Muscle aches with Lipitor, tolerating Pravachol.  Past Medical History:  Diagnosis Date   Anxiety    in small places   Arthritis    COPD (chronic obstructive pulmonary disease) ([place])    Coronary artery disease    Hypertension    Iliac aneurysm ([place]) 01/16/2021   Panic attacks     Past Surgical History:  Procedure Laterality Date   ENDOVASCULAR REPAIR/STENT GRAFT N/A 01/16/2021   Procedure: ENDOVASCULAR REPAIR/STENT GRAFT;  Surgeon: Algernon Huxley, MD;  Location: [facility];  Service: Cardiovascular;  Laterality: N/A;   ILIAC ARTERY ANEURYSM REPAIR  01/16/2021   NO PAST SURGERIES     TOTAL HIP ARTHROPLASTY Left 06/11/2021   Procedure: TOTAL HIP ARTHROPLASTY ANTERIOR APPROACH;  Surgeon: Hessie Knows, MD;  Location: [facility];  Service: Orthopedics;  Laterality: Left;    Current Medications: Current Meds  Medication Sig   amLODipine (NORVASC) 5 MG tablet Take 1 tablet (5 mg total) by mouth daily.   aspirin EC 81 MG EC tablet  Take 1 tablet (81 mg total) by mouth daily. Swallow whole.   betamethasone dipropionate 0.05 % cream Apply topically 2 (two) times daily. Apply for 4-6 weeks, not to exceed 6 weeks.   citalopram (CELEXA) 10 MG tablet Take by mouth.   ezetimibe (ZETIA) 10 MG tablet Take by mouth.   pravastatin (PRAVACHOL) 20 MG tablet Take 1 tablet (20 mg total) by mouth every evening.     Allergies:   Patient has no known allergies.   Social History   Socioeconomic History   Marital status: Married    Spouse name: Susie   Number of children: 1   Years of education: Not on file   Highest education level: Not on file  Occupational History   Not on file  Tobacco Use   Smoking status: Every Day    Packs/day: 0.50    Years: 47.00    Total pack years: 23.50    Types: Cigarettes   Smokeless tobacco: Never  Vaping Use   Vaping Use: Never used  Substance and Sexual Activity   Alcohol use: Not Currently    Comment: with colds   Drug use: Never   Sexual activity: Yes  Other Topics Concern   Not on file  Social History Narrative   Not on file   Social Determinants of Health   Financial Resource Strain: Not on file  Food Insecurity: Not on file  Transportation Needs: Not on file  Physical Activity: Not  on file  Stress: Not on file  Social Connections: Not on file     Family History: The patient's family history includes COPD in his sister; Dementia in his sister; Diabetes in his brother, father, and mother; Healthy in his sister; Hypertension in his father and mother; Leukemia in his sister.  ROS:   Please see the history of present illness.     All other systems reviewed and are negative.  EKGs/Labs/Other Studies Reviewed:    The following studies were reviewed today:   EKG:  EKG is ordered today.  EKG shows normal sinus rhythm, normal ECG  Recent Labs: 01/17/2021: Magnesium 2.0 05/31/2021: ALT 12 06/12/2021: BUN 18; Creatinine, Ser 1.20; Hemoglobin 11.8; Platelets 163; Potassium  4.3; Sodium 137  Recent Lipid Panel    Component Value Date/Time   CHOL 124 03/25/2021 0838   TRIG 66 03/25/2021 0838   HDL 43 03/25/2021 0838   CHOLHDL 2.9 03/25/2021 0838   LDLCALC 67 03/25/2021 0838     Risk Assessment/Calculations:      Physical Exam:    VS:  BP 122/78 (BP Location: Left Arm, Patient Position: Sitting, Cuff Size: Large)   Pulse 68   Ht '6\' 3"'$  (1.905 m)   Wt 210 lb (95.3 kg)   SpO2 98%   BMI 26.25 kg/m     Wt Readings from Last 3 Encounters:  10/29/21 210 lb (95.3 kg)  10/08/21 211 lb (95.7 kg)  06/11/21 216 lb 7.9 oz (98.2 kg)     GEN:  Well nourished, well developed in no acute distress HEENT: Normal NECK: No JVD; No carotid bruits CARDIAC: RRR, no murmurs, rubs, gallops RESPIRATORY: Decreased breath sounds, otherwise clear  rhonchi  ABDOMEN: Soft, non-tender, non-distended MUSCULOSKELETAL:  No edema; No deformity  SKIN: Warm and dry NEUROLOGIC:  Alert and oriented x 3 PSYCHIATRIC:  Normal affect   ASSESSMENT:    1. Coronary artery disease involving native coronary artery of native heart without angina pectoris   2. Primary hypertension   3. Hyperlipidemia LDL goal <70   4. Smoking     PLAN:    In order of problems listed above:  CAD, three-vessel coronary artery calcifications on chest CT, denies chest pain. Continue aspirin 81 mg, Pravachol 20 mg daily, Zetia.  History of muscle aches with Lipitor.  Echo 03/2020 showed normal EF60-65%.   Hypertension, BP controlled. Continue Norvasc 5 mg daily.  Low-salt diet advised.  Consider titration if BP stays elevated at follow-up visit. Hyperlipidemia, cholesterol controlled.  Continue Zetia, Pravachol 20 mg daily.   current smoker, cessation again advised.   Follow-up in 1 year     Medication Adjustments/Labs and Tests Ordered: Current medicines are reviewed at length with the patient today.  Concerns regarding medicines are outlined above.  Orders Placed This Encounter  Procedures    EKG 12-Lead    No orders of the defined types were placed in this encounter.    Patient Instructions  Medication Instructions:  Your physician recommends that you continue on your current medications as directed. Please refer to the Current Medication list given to you today.  *If you need a refill on your cardiac medications before your next appointment, please call your pharmacy*   Follow-Up: At [facility], you and your health needs are our priority.  As part of our continuing mission to provide you with exceptional heart care, we have created designated Provider Care Teams.  These Care Teams include your primary Cardiologist (physician) and Advanced Practice Providers (  APPs -  Physician Assistants and Nurse Practitioners) who all work together to provide you with the care you need, when you need it.  We recommend signing up for the patient portal called "MyChart".  Sign up information is provided on this After Visit Summary.  MyChart is used to connect with patients for Virtual Visits (Telemedicine).  Patients are able to view lab/test results, encounter notes, upcoming appointments, etc.  Non-urgent messages can be sent to your provider as well.   To learn more about what you can do with MyChart, go to [email or web address].    Your next appointment:   1 year(s)  The format for your next appointment:   In Person  Provider:   Kate Sable, MD    Other Instructions   Important Information About Sugar         Signed, Kate Sable, MD  10/29/2021 9:27 AM    [place]

## 2021-12-05 DIAGNOSIS — Z0000 Encounter for general adult medical examination without abnormal findings: Secondary | ICD-10-CM | POA: Insufficient documentation

## 2021-12-05 DIAGNOSIS — R7303 Prediabetes: Secondary | ICD-10-CM | POA: Insufficient documentation

## 2022-01-20 DIAGNOSIS — L720 Epidermal cyst: Secondary | ICD-10-CM | POA: Insufficient documentation

## 2022-01-31 DIAGNOSIS — L0291 Cutaneous abscess, unspecified: Secondary | ICD-10-CM | POA: Insufficient documentation

## 2022-02-10 ENCOUNTER — Other Ambulatory Visit: Payer: Self-pay | Admitting: *Deleted

## 2022-02-10 MED ORDER — EZETIMIBE 10 MG PO TABS: 10.0000 mg | ORAL_TABLET | Freq: Every day | ORAL | 3 refills | Status: AC

## 2022-04-01 ENCOUNTER — Ambulatory Visit (INDEPENDENT_AMBULATORY_CARE_PROVIDER_SITE_OTHER): Payer: Medicare HMO | Admitting: Vascular Surgery

## 2022-04-01 ENCOUNTER — Encounter (INDEPENDENT_AMBULATORY_CARE_PROVIDER_SITE_OTHER): Payer: Self-pay | Admitting: Vascular Surgery

## 2022-04-01 ENCOUNTER — Ambulatory Visit (INDEPENDENT_AMBULATORY_CARE_PROVIDER_SITE_OTHER): Payer: Medicare HMO

## 2022-04-01 VITALS — BP 136/75 | HR 65 | Ht 75.0 in | Wt 210.0 lb

## 2022-04-01 DIAGNOSIS — I723 Aneurysm of iliac artery: Secondary | ICD-10-CM | POA: Diagnosis not present

## 2022-04-01 DIAGNOSIS — E785 Hyperlipidemia, unspecified: Secondary | ICD-10-CM

## 2022-04-01 NOTE — Assessment & Plan Note (Signed)
His duplex today shows a patent stent graft without endoleak and a decrease in the size of the aneurysm sac now down to 3.3 cm in maximal diameter. Previously this was 4 cm.  Doing well.  No change in his medical regimen.  Recheck in 1 year with duplex for follow-up.

## 2022-04-01 NOTE — Progress Notes (Signed)
MRN : [id number]  Blake Padilla is a 72 y.o. (1950-03-27) male who presents with chief complaint of  Chief Complaint  Patient presents with   Follow-up  .  History of Present Illness: Patient returns today in follow up of his iliac artery aneurysm.  He is about 15 months status post endovascular repair of his aneurysm.  Doing well.  He denies any aneurysm related symptoms such as back or abdominal pain or signs of peripheral embolization.  Sounds like he has been having more gastroesophageal reflux issues recently as well as some diarrhea.  His duplex today shows a patent stent graft without endoleak and a decrease in the size of the aneurysm sac now down to 3.3 cm in maximal diameter. Previously this was 4 cm.   Current Outpatient Medications  Medication Sig Dispense Refill   amLODipine (NORVASC) 5 MG tablet Take 1 tablet (5 mg total) by mouth daily. 30 tablet 3   aspirin EC 81 MG EC tablet Take 1 tablet (81 mg total) by mouth daily. Swallow whole. 30 tablet 11   betamethasone dipropionate 0.05 % cream Apply topically 2 (two) times daily. Apply for 4-6 weeks, not to exceed 6 weeks. 45 g 0   citalopram (CELEXA) 10 MG tablet Take by mouth.     ezetimibe (ZETIA) 10 MG tablet Take 1 tablet (10 mg total) by mouth daily. 90 tablet 3   pravastatin (PRAVACHOL) 20 MG tablet Take 1 tablet (20 mg total) by mouth every evening. 1 tablet 3   No current facility-administered medications for this visit.    Past Medical History:  Diagnosis Date   Anxiety    in small places   Arthritis    COPD (chronic obstructive pulmonary disease) ([place])    Coronary artery disease    Hypertension    Iliac aneurysm ([place]) 01/16/2021   Panic attacks     Past Surgical History:  Procedure Laterality Date   ENDOVASCULAR REPAIR/STENT GRAFT N/A 01/16/2021   Procedure: ENDOVASCULAR REPAIR/STENT GRAFT;  Surgeon: Algernon Huxley, MD;  Location: [facility];  Service: Cardiovascular;  Laterality: N/A;    ILIAC ARTERY ANEURYSM REPAIR  01/16/2021   NO PAST SURGERIES     TOTAL HIP ARTHROPLASTY Left 06/11/2021   Procedure: TOTAL HIP ARTHROPLASTY ANTERIOR APPROACH;  Surgeon: Hessie Knows, MD;  Location: [facility];  Service: Orthopedics;  Laterality: Left;     Social History   Tobacco Use   Smoking status: Every Day    Packs/day: 0.50    Years: 47.00    Total pack years: 23.50    Types: Cigarettes   Smokeless tobacco: Never  Vaping Use   Vaping Use: Never used  Substance Use Topics   Alcohol use: Not Currently    Comment: with colds   Drug use: Never      Family History  Problem Relation Age of Onset   Diabetes Mother    Hypertension Mother    Diabetes Father    Hypertension Father    Leukemia Sister    Diabetes Brother    Dementia Sister    COPD Sister    Healthy Sister      No Known Allergies   REVIEW OF SYSTEMS (Negative unless checked)   Constitutional: '[]'$ Weight loss  '[]'$ Fever  '[]'$ Chills Cardiac: '[]'$ Chest pain   '[]'$ Chest pressure   '[]'$ Palpitations   '[]'$ Shortness of breath when laying flat   '[]'$ Shortness of breath at rest   '[]'$ Shortness of breath with exertion. Vascular:  '[]'$ Pain in  legs with walking   '[]'$ Pain in legs at rest   '[]'$ Pain in legs when laying flat   '[]'$ Claudication   '[]'$ Pain in feet when walking  '[]'$ Pain in feet at rest  '[]'$ Pain in feet when laying flat   '[]'$ History of DVT   '[]'$ Phlebitis   '[]'$ Swelling in legs   '[]'$ Varicose veins   '[]'$ Non-healing ulcers Pulmonary:   '[]'$ Uses home oxygen   '[]'$ Productive cough   '[]'$ Hemoptysis   '[]'$ Wheeze  '[]'$ COPD   '[]'$ Asthma Neurologic:  '[]'$ Dizziness  '[]'$ Blackouts   '[]'$ Seizures   '[]'$ History of stroke   '[]'$ History of TIA  '[]'$ Aphasia   '[]'$ Temporary blindness   '[]'$ Dysphagia   '[]'$ Weakness or numbness in arms   '[x]'$ Weakness or numbness in legs Musculoskeletal:  '[x]'$ Arthritis   '[]'$ Joint swelling   '[x]'$ Joint pain   '[]'$ Low back pain Hematologic:  '[]'$ Easy bruising  '[]'$ Easy bleeding   '[]'$ Hypercoagulable state   '[]'$ Anemic   Gastrointestinal:  '[]'$ Blood in stool   '[]'$ Vomiting blood   '[]'$ Gastroesophageal reflux/heartburn   '[]'$ Abdominal pain Genitourinary:  '[]'$ Chronic kidney disease   '[]'$ Difficult urination  '[]'$ Frequent urination  '[]'$ Burning with urination   '[]'$ Hematuria Skin:  '[]'$ Rashes   '[]'$ Ulcers   '[]'$ Wounds Psychological:  '[]'$ History of anxiety   '[]'$  History of major depression.  Physical Examination  BP 136/75   Pulse 65   Ht '6\' 3"'$  (1.905 m)   Wt 210 lb (95.3 kg)   BMI 26.25 kg/m  Gen:  WD/WN, NAD. Appears younger than stated age. Head: [place]/AT, No temporalis wasting. Ear/Nose/Throat: Hearing grossly intact, nares w/o erythema or drainage Eyes: Conjunctiva clear. Sclera non-icteric Neck: Supple.  Trachea midline Pulmonary:  Good air movement, no use of accessory muscles.  Cardiac: RRR, no JVD Vascular:  Vessel Right Left  Radial Palpable Palpable                          PT Palpable Palpable  DP Palpable Palpable   Gastrointestinal: soft, non-tender/non-distended. No guarding/reflex.  Musculoskeletal: M/S 5/5 throughout.  No deformity or atrophy. No edema. Neurologic: Sensation grossly intact in extremities.  Symmetrical.  Speech is fluent.  Psychiatric: Judgment intact, Mood & affect appropriate for pt's clinical situation. Dermatologic: No rashes or ulcers noted.  No cellulitis or open wounds.      Labs No results found for this or any previous visit (from the past 2160 hour(s)).  Radiology No results found.  Assessment/Plan Hyperlipidemia lipid control important in reducing the progression of atherosclerotic disease. Continue statin therapy  Iliac artery aneurysm (HCC) His duplex today shows a patent stent graft without endoleak and a decrease in the size of the aneurysm sac now down to 3.3 cm in maximal diameter. Previously this was 4 cm.  Doing well.  No change in his medical regimen.  Recheck in 1 year with duplex for follow-up.    [name], MD  04/01/2022 11:11 AM    This note was created with Dragon medical transcription system.  Any  errors from dictation are purely unintentional

## 2022-05-22 ENCOUNTER — Ambulatory Visit
Admission: RE | Admit: 2022-05-22 | Discharge: 2022-05-22 | Disposition: A | Discharge status: Home / Self Care | Payer: Medicare HMO | Source: Ambulatory Visit | Attending: Family Medicine | Admitting: Family Medicine

## 2022-05-22 DIAGNOSIS — F17210 Nicotine dependence, cigarettes, uncomplicated: Secondary | ICD-10-CM | POA: Insufficient documentation

## 2022-05-22 DIAGNOSIS — Z87891 Personal history of nicotine dependence: Secondary | ICD-10-CM | POA: Diagnosis present

## 2022-05-23 ENCOUNTER — Other Ambulatory Visit: Payer: Self-pay

## 2022-05-23 DIAGNOSIS — Z87891 Personal history of nicotine dependence: Secondary | ICD-10-CM

## 2022-05-23 DIAGNOSIS — F17210 Nicotine dependence, cigarettes, uncomplicated: Secondary | ICD-10-CM

## 2022-10-21 ENCOUNTER — Ambulatory Visit: Payer: Medicare HMO | Attending: Cardiology | Admitting: Cardiology

## 2022-10-21 ENCOUNTER — Encounter: Payer: Self-pay | Admitting: Cardiology

## 2022-10-21 VITALS — BP 116/70 | HR 74 | Ht 75.0 in | Wt 230.6 lb

## 2022-10-21 DIAGNOSIS — I10 Essential (primary) hypertension: Secondary | ICD-10-CM

## 2022-10-21 DIAGNOSIS — E785 Hyperlipidemia, unspecified: Secondary | ICD-10-CM | POA: Diagnosis not present

## 2022-10-21 DIAGNOSIS — I2510 Atherosclerotic heart disease of native coronary artery without angina pectoris: Secondary | ICD-10-CM | POA: Diagnosis not present

## 2022-10-21 NOTE — Patient Instructions (Signed)

## 2022-10-21 NOTE — Progress Notes (Signed)
Cardiology Office Note:    Date:  10/21/2022   ID:  Blake Padilla, DOB Jul 07, 1950, MRN [id number]  PCP:  Leanna Sato, MD  [facility] Cardiologist:  Debbe Odea, MD  [facility] Electrophysiologist:  None   Referring MD: Leanna Sato, MD   Chief Complaint  Patient presents with   Follow-up    Patient denies new or acute cardiac problems/concerns today.      History of Present Illness:    Blake Padilla is a 72 y.o. 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   Quit smoking almost a year ago, breathing is better, states having occasional cough and drainage in the back of his throat.  Prescribed Zyrtec by PCP.  Also given heartburn medications.  Denies chest pain.  Blood pressure adequately controlled on amlodipine.   Prior notes Echocardiogram 03/2020 EF 60 to 65%, impaired relaxation. Muscle aches with Lipitor, tolerating Pravachol.  Past Medical History:  Diagnosis Date   Anxiety    in small places   Arthritis    COPD (chronic obstructive pulmonary disease) (HCC)    Coronary artery disease    Hypertension    Iliac aneurysm (HCC) 01/16/2021   Panic attacks     Past Surgical History:  Procedure Laterality Date   ENDOVASCULAR REPAIR/STENT GRAFT N/A 01/16/2021   Procedure: ENDOVASCULAR REPAIR/STENT GRAFT;  Surgeon: Annice Needy, MD;  Location: [facility] INVASIVE CV LAB;  Service: Cardiovascular;  Laterality: N/A;   ILIAC ARTERY ANEURYSM REPAIR  01/16/2021   TOTAL HIP ARTHROPLASTY Left 06/11/2021   Procedure: TOTAL HIP ARTHROPLASTY ANTERIOR APPROACH;  Surgeon: Kennedy Bucker, MD;  Location: [facility] ORS;  Service: Orthopedics;  Laterality: Left;    Current Medications: Current Meds  Medication Sig   amLODipine (NORVASC) 5 MG tablet Take 1 tablet (5 mg total) by mouth daily.   aspirin EC 81 MG EC tablet  Take 1 tablet (81 mg total) by mouth daily. Swallow whole.   cetirizine (ZYRTEC) 10 MG tablet Take 10 mg by mouth daily.   ezetimibe (ZETIA) 10 MG tablet Take 1 tablet (10 mg total) by mouth daily.   NEXIUM 40 MG capsule Take 40 mg by mouth daily.   pravastatin (PRAVACHOL) 20 MG tablet Take 1 tablet (20 mg total) by mouth every evening.     Allergies:   Patient has no known allergies.   Social History   Socioeconomic History   Marital status: Married    Spouse name: Susie   Number of children: 1   Years of education: Not on file   Highest education level: Not on file  Occupational History   Not on file  Tobacco Use   Smoking status: Former    Current packs/day: 0.00    Types: Cigarettes    Quit date: 1971    Years since quitting: 53.6   Smokeless tobacco: Never   Tobacco comments:    Quit smoking 11/2021  Vaping Use   Vaping status: Never Used  Substance and Sexual Activity   Alcohol use: Not Currently    Comment: with colds   Drug use: Never   Sexual activity: Yes  Other Topics Concern   Not on file  Social History Narrative   Not on file   Social Determinants of Health   Financial Resource Strain: Not on file  Food Insecurity: Not on file  Transportation Needs: Not on  file  Physical Activity: Not on file  Stress: Not on file  Social Connections: Not on file     Family History: The patient's family history includes COPD in his sister; Dementia in his sister; Diabetes in his brother, father, and mother; Healthy in his sister; Hypertension in his father and mother; Leukemia in his sister.  ROS:   Please see the history of present illness.     All other systems reviewed and are negative.  EKGs/Labs/Other Studies Reviewed:    The following studies were reviewed today:   EKG Interpretation Date/Time:  Tuesday October 21 2022 15:29:36 EDT Ventricular Rate:  74 PR Interval:  124 QRS Duration:  100 QT Interval:  392 QTC Calculation: 435 R  Axis:   28  Text Interpretation: Sinus rhythm with Premature supraventricular complexes Confirmed by Debbe Odea ([id number]) on 10/21/2022 3:48:23 PM    Recent Labs: No results found for requested labs within last 365 days.  Recent Lipid Panel    Component Value Date/Time   CHOL 124 03/25/2021 0838   TRIG 66 03/25/2021 0838   HDL 43 03/25/2021 0838   CHOLHDL 2.9 03/25/2021 0838   LDLCALC 67 03/25/2021 0838     Risk Assessment/Calculations:      Physical Exam:    VS:  BP 116/70 (BP Location: Left Arm, Patient Position: Sitting, Cuff Size: Large)   Pulse 74   Ht 6\' 3"  (1.905 m)   Wt 230 lb 9.6 oz (104.6 kg)   SpO2 96%   BMI 28.82 kg/m     Wt Readings from Last 3 Encounters:  10/21/22 230 lb 9.6 oz (104.6 kg)  04/01/22 210 lb (95.3 kg)  10/29/21 210 lb (95.3 kg)     GEN:  Well nourished, well developed in no acute distress HEENT: Normal NECK: No JVD; No carotid bruits CARDIAC: RRR, no murmurs, rubs, gallops RESPIRATORY: Decreased breath sounds, otherwise clear  rhonchi  ABDOMEN: Soft, non-tender, non-distended MUSCULOSKELETAL:  No edema; No deformity  SKIN: Warm and dry NEUROLOGIC:  Alert and oriented x 3 PSYCHIATRIC:  Normal affect   ASSESSMENT:    1. Coronary artery disease involving native coronary artery of native heart without angina pectoris   2. Primary hypertension   3. Hyperlipidemia LDL goal <70    PLAN:    In order of problems listed above:  CAD, three-vessel coronary artery calcifications on chest CT, denies chest pain. Continue aspirin 81 mg, Pravachol 20 mg daily, Zetia.  History of muscle aches with Lipitor.  Echo 03/2020 showed normal EF60-65%.   Hypertension, BP controlled. Continue Norvasc 5 mg daily.   Hyperlipidemia, cholesterol controlled.  Continue Zetia, Pravachol 20 mg daily.    Follow-up in 1 year   Medication Adjustments/Labs and Tests Ordered: Current medicines are reviewed at length with the patient today.  Concerns regarding  medicines are outlined above.  Orders Placed This Encounter  Procedures   EKG 12-Lead    No orders of the defined types were placed in this encounter.    Patient Instructions  Medication Instructions:   Your physician recommends that you continue on your current medications as directed. Please refer to the Current Medication list given to you today.   *If you need a refill on your cardiac medications before your next appointment, please call your pharmacy*   Lab Work:  None Ordered  If you have labs (blood work) drawn today and your tests are completely normal, you will receive your results only by: MyChart Message (if you have MyChart)  OR A paper copy in the mail If you have any lab test that is abnormal or we need to change your treatment, we will call you to review the results.   Testing/Procedures:  None Ordered   Follow-Up: At [facility], you and your health needs are our priority.  As part of our continuing mission to provide you with exceptional heart care, we have created designated Provider Care Teams.  These Care Teams include your primary Cardiologist (physician) and Advanced Practice Providers (APPs -  Physician Assistants and Nurse Practitioners) who all work together to provide you with the care you need, when you need it.  We recommend signing up for the patient portal called "MyChart".  Sign up information is provided on this After Visit Summary.  MyChart is used to connect with patients for Virtual Visits (Telemedicine).  Patients are able to view lab/test results, encounter notes, upcoming appointments, etc.  Non-urgent messages can be sent to your provider as well.   To learn more about what you can do with MyChart, go to [email or web address].    Your next appointment:   12 month(s)  Provider:   You may see Debbe Odea, MD or one of the following Advanced Practice Providers on your designated Care Team:   Nicolasa Ducking, NP Eula Listen, PA-C Cadence Fransico Michael, PA-C Charlsie Quest, NP   Signed, Debbe Odea, MD  10/21/2022 4:58 PM    [facility]

## 2023-03-31 ENCOUNTER — Ambulatory Visit (INDEPENDENT_AMBULATORY_CARE_PROVIDER_SITE_OTHER): Payer: Medicare HMO | Admitting: Vascular Surgery

## 2023-03-31 ENCOUNTER — Encounter (INDEPENDENT_AMBULATORY_CARE_PROVIDER_SITE_OTHER): Payer: Self-pay | Admitting: Vascular Surgery

## 2023-03-31 ENCOUNTER — Ambulatory Visit (INDEPENDENT_AMBULATORY_CARE_PROVIDER_SITE_OTHER): Payer: Medicare HMO

## 2023-03-31 VITALS — BP 124/68 | HR 65 | Resp 16 | Ht 75.0 in | Wt 245.0 lb

## 2023-03-31 DIAGNOSIS — I723 Aneurysm of iliac artery: Secondary | ICD-10-CM | POA: Diagnosis not present

## 2023-03-31 DIAGNOSIS — E785 Hyperlipidemia, unspecified: Secondary | ICD-10-CM | POA: Diagnosis not present

## 2023-03-31 NOTE — Assessment & Plan Note (Signed)
 lipid control important in reducing the progression of atherosclerotic disease. Continue statin therapy

## 2023-03-31 NOTE — Assessment & Plan Note (Signed)
His duplex today shows a patent stent graft without any endoleak.  The maximal sac diameter has decreased down to 3.2 cm.  Remains on aspirin and a statin agent.  Continue to follow on an annual basis with duplex.

## 2023-03-31 NOTE — Progress Notes (Signed)
 MRN : [id number]  Blake Padilla is a 73 y.o. (05/15/1950) male who presents with chief complaint of  Chief Complaint  Patient presents with   Follow-up    33yr EVAR follow up  .  History of Present Illness: Patient returns today in follow up of of his iliac artery aneurysm.  He is doing well without any current aneurysm related symptoms.  He had an endovascular repair of this aneurysm a couple of years ago.  He has since stopped smoking and following with his cardiologist for coronary disease.  Other than gaining some weight, he is doing well and feeling well.  No new complaints today.  His duplex today shows a patent stent graft without any endoleak.  The maximal sac diameter has decreased down to 3.2 cm.  Current Outpatient Medications  Medication Sig Dispense Refill   amLODipine  (NORVASC ) 5 MG tablet Take 1 tablet (5 mg total) by mouth daily. 30 tablet 3   aspirin  EC 81 MG EC tablet Take 1 tablet (81 mg total) by mouth daily. Swallow whole. 30 tablet 11   cetirizine (ZYRTEC) 10 MG tablet Take 10 mg by mouth daily.     ezetimibe  (ZETIA ) 10 MG tablet Take 1 tablet (10 mg total) by mouth daily. 90 tablet 3   NEXIUM 40 MG capsule Take 40 mg by mouth daily.     pravastatin  (PRAVACHOL ) 20 MG tablet Take 1 tablet (20 mg total) by mouth every evening. 1 tablet 3   betamethasone  dipropionate 0.05 % cream Apply topically 2 (two) times daily. Apply for 4-6 weeks, not to exceed 6 weeks. (Patient not taking: Reported on 03/31/2023) 45 g 0   citalopram (CELEXA) 10 MG tablet Take by mouth. (Patient not taking: Reported on 03/31/2023)     No current facility-administered medications for this visit.    Past Medical History:  Diagnosis Date   Anxiety    in small places   Arthritis    COPD (chronic obstructive pulmonary disease) (HCC)    Coronary artery disease    Hypertension    Iliac aneurysm (HCC) 01/16/2021   Panic attacks     Past Surgical History:  Procedure Laterality Date    ENDOVASCULAR REPAIR/STENT GRAFT N/A 01/16/2021   Procedure: ENDOVASCULAR REPAIR/STENT GRAFT;  Surgeon: Marea Selinda RAMAN, MD;  Location: [facility] INVASIVE CV LAB;  Service: Cardiovascular;  Laterality: N/A;   ILIAC ARTERY ANEURYSM REPAIR  01/16/2021   TOTAL HIP ARTHROPLASTY Left 06/11/2021   Procedure: TOTAL HIP ARTHROPLASTY ANTERIOR APPROACH;  Surgeon: Kathlynn Sharper, MD;  Location: [facility] ORS;  Service: Orthopedics;  Laterality: Left;     Social History   Tobacco Use   Smoking status: Former    Current packs/day: 0.00    Types: Cigarettes    Quit date: 1971    Years since quitting: 54.1   Smokeless tobacco: Never   Tobacco comments:    Quit smoking 11/2021  Vaping Use   Vaping status: Never Used  Substance Use Topics   Alcohol use: Not Currently    Comment: with colds   Drug use: Never      Family History  Problem Relation Age of Onset   Diabetes Mother    Hypertension Mother    Diabetes Father    Hypertension Father    Leukemia Sister    Diabetes Brother    Dementia Sister    COPD Sister    Healthy Sister      No Known Allergies   REVIEW OF SYSTEMS (Negative  unless checked)   Constitutional: [] Weight loss  [] Fever  [] Chills Cardiac: [] Chest pain   [] Chest pressure   [] Palpitations   [] Shortness of breath when laying flat   [] Shortness of breath at rest   [] Shortness of breath with exertion. Vascular:  [] Pain in legs with walking   [] Pain in legs at rest   [] Pain in legs when laying flat   [] Claudication   [] Pain in feet when walking  [] Pain in feet at rest  [] Pain in feet when laying flat   [] History of DVT   [] Phlebitis   [] Swelling in legs   [] Varicose veins   [] Non-healing ulcers Pulmonary:   [] Uses home oxygen   [] Productive cough   [] Hemoptysis   [] Wheeze  [] COPD   [] Asthma Neurologic:  [] Dizziness  [] Blackouts   [] Seizures   [] History of stroke   [] History of TIA  [] Aphasia   [] Temporary blindness   [] Dysphagia   [] Weakness or numbness in arms   [x] Weakness or  numbness in legs Musculoskeletal:  [x] Arthritis   [] Joint swelling   [x] Joint pain   [] Low back pain Hematologic:  [] Easy bruising  [] Easy bleeding   [] Hypercoagulable state   [] Anemic   Gastrointestinal:  [] Blood in stool   [] Vomiting blood  [] Gastroesophageal reflux/heartburn   [] Abdominal pain Genitourinary:  [] Chronic kidney disease   [] Difficult urination  [] Frequent urination  [] Burning with urination   [] Hematuria Skin:  [] Rashes   [] Ulcers   [] Wounds Psychological:  [] History of anxiety   []  History of major depression.  Physical Examination  BP 124/68   Pulse 65   Resp 16   Ht 6' 3 (1.905 m)   Wt 245 lb (111.1 kg)   BMI 30.62 kg/m  Gen:  WD/WN, NAD. Appears younger than stated age. Head: [place]/AT, No temporalis wasting. Ear/Nose/Throat: Hearing grossly intact, nares w/o erythema or drainage Eyes: Conjunctiva clear. Sclera non-icteric Neck: Supple.  Trachea midline Pulmonary:  Good air movement, no use of accessory muscles.  Cardiac: RRR, no JVD Vascular:  Vessel Right Left  Radial Palpable Palpable                          PT Palpable Palpable  DP Palpable Palpable   Gastrointestinal: soft, non-tender/non-distended. No guarding/reflex.  Musculoskeletal: M/S 5/5 throughout.  No deformity or atrophy. No edema. Neurologic: Sensation grossly intact in extremities.  Symmetrical.  Speech is fluent.  Psychiatric: Judgment intact, Mood & affect appropriate for pt's clinical situation. Dermatologic: No rashes or ulcers noted.  No cellulitis or open wounds.      Labs No results found for this or any previous visit (from the past 2160 hours).  Radiology No results found.  Assessment/Plan  Hyperlipidemia lipid control important in reducing the progression of atherosclerotic disease. Continue statin therapy   Iliac artery aneurysm (HCC) His duplex today shows a patent stent graft without any endoleak.  The maximal sac diameter has decreased down to 3.2 cm.   Remains on aspirin  and a statin agent.  Continue to follow on an annual basis with duplex.    Selinda Gu, MD  03/31/2023 9:43 AM    This note was created with Dragon medical transcription system.  Any errors from dictation are purely unintentional

## 2023-06-02 ENCOUNTER — Other Ambulatory Visit: Payer: Self-pay | Admitting: *Deleted

## 2023-06-02 DIAGNOSIS — Z122 Encounter for screening for malignant neoplasm of respiratory organs: Secondary | ICD-10-CM

## 2023-06-02 DIAGNOSIS — Z87891 Personal history of nicotine dependence: Secondary | ICD-10-CM

## 2023-06-09 ENCOUNTER — Ambulatory Visit
Admission: RE | Admit: 2023-06-09 | Discharge: 2023-06-09 | Disposition: A | Discharge status: Home / Self Care | Source: Ambulatory Visit | Attending: Family Medicine | Admitting: Family Medicine

## 2023-06-09 DIAGNOSIS — Z122 Encounter for screening for malignant neoplasm of respiratory organs: Secondary | ICD-10-CM | POA: Diagnosis present

## 2023-06-09 DIAGNOSIS — Z87891 Personal history of nicotine dependence: Secondary | ICD-10-CM | POA: Insufficient documentation

## 2023-06-15 DIAGNOSIS — R059 Cough, unspecified: Secondary | ICD-10-CM | POA: Insufficient documentation

## 2023-07-09 ENCOUNTER — Telehealth: Payer: Self-pay | Admitting: Acute Care

## 2023-07-09 NOTE — Telephone Encounter (Signed)
 Call report received:  IMPRESSION: 1. Newly mildly enlarged 1.7 cm right pericardiophrenic node, indeterminate for metastatic disease. Suggest follow-up chest CT with IV contrast in 3 months. Alternatively, PET-CT could be considered at this time. 2. Lung-RADS 2, benign appearance or behavior. Continue annual screening with low-dose chest CT without contrast in 12 months. 3. Two-vessel coronary atherosclerosis. 4. Stable left adrenal adenoma, for which no follow-up imaging is recommended. 5. Aortic Atherosclerosis (ICD10-I70.0) and Emphysema (ICD10-J43.9).

## 2023-07-15 ENCOUNTER — Telehealth: Payer: Self-pay | Admitting: Acute Care

## 2023-07-15 NOTE — Telephone Encounter (Signed)
 I have attempted to call the patient with the results of their  Low Dose CT Chest Lung cancer screening scan. There was no answer. I have left a HIPPA compliant VM requesting the patient call the office for the scan results. I included the office contact information in the message. We will await his return call. If no return call we will continue to call until patient is contacted.    He will need a 3 month follow up CT Chest with contrast and then follow up with me after the scan  to re-evaluate the enlarged lymph node. I will speak with him when he calls, just let me know. Thanks so much

## 2023-07-16 ENCOUNTER — Telehealth: Payer: Self-pay | Admitting: Acute Care

## 2023-07-16 NOTE — Telephone Encounter (Signed)
 I have spoken with Dr. Viva Grise and Dr. Darnelle Elders. They both feel this patient needs a PET scan and then follow up with TCTS if the nodule is hot on PET scan. Just wanted to make sure everyone knew the plan. Thanks

## 2023-07-16 NOTE — Telephone Encounter (Signed)
 See provider note 07/15/2023

## 2023-07-16 NOTE — Telephone Encounter (Signed)
 I have attempted to call the patient with the results of their  Low Dose CT Chest Lung cancer screening scan. There was no answer. I have left a HIPPA compliant VM requesting the patient call the office for the scan results. I included the office contact information in the message. We will await his return call. If no return call we will continue to call until patient is contacted.  Hopefully he will call back.   Screening Team, We may need to send a letter. Thanks so much.

## 2023-07-21 ENCOUNTER — Other Ambulatory Visit: Payer: Self-pay

## 2023-07-21 DIAGNOSIS — R918 Other nonspecific abnormal finding of lung field: Secondary | ICD-10-CM

## 2023-07-21 DIAGNOSIS — Z122 Encounter for screening for malignant neoplasm of respiratory organs: Secondary | ICD-10-CM

## 2023-07-21 DIAGNOSIS — Z87891 Personal history of nicotine dependence: Secondary | ICD-10-CM

## 2023-07-21 NOTE — Telephone Encounter (Signed)
 Blake Padilla,   I have spoken with the patient and reviewed his recent Lung CT results. He is in agreement to complete a PET scan. Pt had no questions. PET scan order placed. Results and plan sent to PCP.

## 2023-07-28 ENCOUNTER — Ambulatory Visit
Admission: RE | Admit: 2023-07-28 | Discharge: 2023-07-28 | Disposition: A | Discharge status: Home / Self Care | Source: Ambulatory Visit | Attending: Acute Care | Admitting: Acute Care

## 2023-07-28 DIAGNOSIS — R911 Solitary pulmonary nodule: Secondary | ICD-10-CM | POA: Insufficient documentation

## 2023-07-28 DIAGNOSIS — Z969 Presence of functional implant, unspecified: Secondary | ICD-10-CM | POA: Insufficient documentation

## 2023-07-28 DIAGNOSIS — R918 Other nonspecific abnormal finding of lung field: Secondary | ICD-10-CM | POA: Insufficient documentation

## 2023-07-28 DIAGNOSIS — I723 Aneurysm of iliac artery: Secondary | ICD-10-CM | POA: Diagnosis not present

## 2023-07-28 DIAGNOSIS — J432 Centrilobular emphysema: Secondary | ICD-10-CM | POA: Diagnosis not present

## 2023-07-28 DIAGNOSIS — Z87891 Personal history of nicotine dependence: Secondary | ICD-10-CM

## 2023-07-28 DIAGNOSIS — N400 Enlarged prostate without lower urinary tract symptoms: Secondary | ICD-10-CM | POA: Diagnosis not present

## 2023-07-28 DIAGNOSIS — Z122 Encounter for screening for malignant neoplasm of respiratory organs: Secondary | ICD-10-CM

## 2023-07-28 LAB — GLUCOSE, CAPILLARY: Glucose-Capillary: 134 mg/dL — ABNORMAL HIGH (ref 70–99)

## 2023-07-28 MED ORDER — FLUDEOXYGLUCOSE F - 18 (FDG) INJECTION
12.8400 | Freq: Once | INTRAVENOUS | Status: AC | PRN
  Administered 2023-07-28: 12.84 via INTRAVENOUS

## 2023-07-29 ENCOUNTER — Ambulatory Visit

## 2023-08-03 ENCOUNTER — Telehealth: Payer: Self-pay | Admitting: Acute Care

## 2023-08-03 ENCOUNTER — Other Ambulatory Visit: Payer: Self-pay | Admitting: Acute Care

## 2023-08-03 DIAGNOSIS — R591 Generalized enlarged lymph nodes: Secondary | ICD-10-CM

## 2023-08-03 NOTE — Telephone Encounter (Signed)
 I have called the patient with the results of his PET scan. I explained  the scan shows that there are Hypermetabolic mediastinal lymph nodes in the right cardiophrenic angle and retrosternal anterior mediastinum. Differential diagnosis includes lymphoproliferative disorder, inflammatory or infectious etiologies, and less likely metastatic disease. I have reviewed the results with the patient, and I have made a referral to Thoracic surgery for consideration of a mediastinoscopy for tissue evaluation. Pt. Is in agreement with the plan, and verbalized understanding of the above.  Referral has been made. Please fax results to PCP and let them know plan. Thanks so much.

## 2023-08-03 NOTE — Telephone Encounter (Signed)
 Results and plan sent to PCP.

## 2023-08-07 ENCOUNTER — Ambulatory Visit
Attending: Thoracic Surgery (Cardiothoracic Vascular Surgery) | Admitting: Thoracic Surgery (Cardiothoracic Vascular Surgery)

## 2023-08-07 ENCOUNTER — Encounter: Payer: Self-pay | Admitting: Thoracic Surgery (Cardiothoracic Vascular Surgery)

## 2023-08-07 VITALS — BP 153/63 | HR 70 | Resp 20 | Ht 75.0 in | Wt 236.4 lb

## 2023-08-07 DIAGNOSIS — R590 Localized enlarged lymph nodes: Secondary | ICD-10-CM | POA: Diagnosis not present

## 2023-08-07 NOTE — Progress Notes (Signed)
 PCP is Macie Saxon, MD Referring Provider is Raejean Bullock, NP  Chief Complaint  Patient presents with   Mediastinal Lymphadenopathy    New patient consultation, PET 6/3, Chest CT 4/15    HPI: [name] is sent for consultation regarding mediastinal adenopathy.  Blake Padilla is a 73 year old man with a history of tobacco use (quit 2 years ago), COPD, coronary artery disease, hypertension, hyperlipidemia, iliac artery aneurysm, arthritis, and panic attacks.  He is followed in the low-dose CT for lung cancer screening program at [facility].  In April his scan showed a 1.7 cm lymph node in the right pericardial fat pad at the cardiophrenic angle.  There were multiple small lung nodules bilaterally that have been present previously and were unchanged.  He had a PET/CT which showed the node was hypermetabolic with an SUV of 5.6.  There was a smaller node in the anterior mediastinal fat pad with SUV of 2.7.  He has not had any fevers, chills, night sweats, chest pain, pressure, tightness, or shortness of breath.  Has been anxious about the findings.  Had food poisoning a couple weeks ago.  He has lost 15 pounds over the past 3 months.  Zubrod Score: At the time of surgery this patient's most appropriate activity status/level should be described as: [x]     0    Normal activity, no symptoms []     1    Restricted in physical strenuous activity but ambulatory, able to do out light work []     2    Ambulatory and capable of self care, unable to do work activities, up and about >50 % of waking hours                              []     3    Only limited self care, in bed greater than 50% of waking hours []     4    Completely disabled, no self care, confined to bed or chair []     5    Moribund  Past Medical History:  Diagnosis Date   Anxiety    in small places   Arthritis    COPD (chronic obstructive pulmonary disease) (HCC)    Coronary artery disease    Hypertension    Iliac aneurysm (HCC)  01/16/2021   Panic attacks     Past Surgical History:  Procedure Laterality Date   ENDOVASCULAR REPAIR/STENT GRAFT N/A 01/16/2021   Procedure: ENDOVASCULAR REPAIR/STENT GRAFT;  Surgeon: Celso College, MD;  Location: [facility] INVASIVE CV LAB;  Service: Cardiovascular;  Laterality: N/A;   ILIAC ARTERY ANEURYSM REPAIR  01/16/2021   TOTAL HIP ARTHROPLASTY Left 06/11/2021   Procedure: TOTAL HIP ARTHROPLASTY ANTERIOR APPROACH;  Surgeon: Molli Angelucci, MD;  Location: [facility] ORS;  Service: Orthopedics;  Laterality: Left;    Family History  Problem Relation Age of Onset   Diabetes Mother    Hypertension Mother    Diabetes Father    Hypertension Father    Leukemia Sister    Diabetes Brother    Dementia Sister    COPD Sister    Healthy Sister     Social History Social History   Tobacco Use   Smoking status: Former    Current packs/day: 0.00    Types: Cigarettes    Quit date: 1971    Years since quitting: 54.4   Smokeless tobacco: Never   Tobacco comments:    Quit smoking  11/2021  Vaping Use   Vaping status: Never Used  Substance Use Topics   Alcohol use: Not Currently    Comment: with colds   Drug use: Never    Current Outpatient Medications  Medication Sig Dispense Refill   amLODipine  (NORVASC ) 5 MG tablet Take 1 tablet (5 mg total) by mouth daily. 30 tablet 3   aspirin  EC 81 MG EC tablet Take 1 tablet (81 mg total) by mouth daily. Swallow whole. 30 tablet 11   citalopram (CELEXA) 10 MG tablet Take by mouth.     ezetimibe  (ZETIA ) 10 MG tablet Take 1 tablet (10 mg total) by mouth daily. 90 tablet 3   NEXIUM 40 MG capsule Take 40 mg by mouth daily.     pravastatin  (PRAVACHOL ) 20 MG tablet Take 1 tablet (20 mg total) by mouth every evening. 1 tablet 3   betamethasone  dipropionate 0.05 % cream Apply topically 2 (two) times daily. Apply for 4-6 weeks, not to exceed 6 weeks. (Patient not taking: Reported on 03/31/2023) 45 g 0   cetirizine (ZYRTEC) 10 MG tablet Take 10 mg by mouth daily.      No current facility-administered medications for this visit.    No Known Allergies  Review of Systems  Constitutional:  Positive for unexpected weight change. Negative for activity change, chills, diaphoresis and fever.  HENT:  Negative for trouble swallowing and voice change.   Respiratory:  Negative for shortness of breath.   Cardiovascular:  Negative for chest pain.  Musculoskeletal:  Positive for arthralgias.  Neurological:  Negative for seizures and weakness.  Psychiatric/Behavioral:  The patient is nervous/anxious.   All other systems reviewed and are negative.   BP (!) 153/63 (BP Location: Right Arm, Patient Position: Sitting, Cuff Size: Normal)   Pulse 70   Resp 20   Ht 6' 3 (1.905 m)   Wt 236 lb 6.4 oz (107.2 kg)   SpO2 93% Comment: RA  BMI 29.55 kg/m  Physical Exam Vitals reviewed.  Constitutional:      General: He is not in acute distress.    Appearance: Normal appearance.  HENT:     Head: Normocephalic and atraumatic.   Eyes:     General: No scleral icterus.    Extraocular Movements: Extraocular movements intact.    Cardiovascular:     Rate and Rhythm: Normal rate and regular rhythm.     Heart sounds: Normal heart sounds. No murmur heard.    No friction rub. No gallop.  Pulmonary:     Effort: Pulmonary effort is normal. No respiratory distress.     Breath sounds: Normal breath sounds. No wheezing or rales.  Abdominal:     General: There is no distension.     Palpations: Abdomen is soft.   Musculoskeletal:     Cervical back: Neck supple.  Lymphadenopathy:     Cervical: No cervical adenopathy.   Skin:    General: Skin is warm and dry.   Neurological:     General: No focal deficit present.     Mental Status: He is alert and oriented to person, place, and time.     Cranial Nerves: No cranial nerve deficit.     Motor: No weakness.    Diagnostic Tests: NUCLEAR MEDICINE PET SKULL BASE TO THIGH   TECHNIQUE: 12.8 mCi F-18 FDG was injected  intravenously. Full-ring PET imaging was performed from the skull base to thigh after the radiotracer. CT data was obtained and used for attenuation correction and anatomic localization.  Fasting blood glucose: 134 mg/dl   COMPARISON:  Chest CT on 06/09/2023   FINDINGS: Mediastinal blood-pool activity (background): SUV max = 1.9   Liver activity (reference): SUV max = N/A   NECK:  No hypermetabolic lymph nodes or masses.   Incidental CT findings:  None.   CHEST: 1.5 cm mediastinal lymph node in the right cardiophrenic angle shows hypermetabolism, with SUV max of 5.6. A 7 mm anterior mediastinal lymph node between the anterior heart border and sternum shows mild hypermetabolism, with SUV max of 2.7. No other sites of hypermetabolic lymphadenopathy identified.   Mild centrilobular emphysema no suspicious pulmonary nodules seen on CT images.   Incidental CT findings: Mild centrilobular emphysema. 5 mm pulmonary nodule in the inferior right upper lobe on image 68/6 remains stable, and shows no FDG uptake.   ABDOMEN/PELVIS: No abnormal hypermetabolic activity within the liver, pancreas, adrenal glands, or spleen. No hypermetabolic lymph nodes in the abdomen or pelvis.   Incidental CT findings: Aorto bi-iliac stent graft is seen, with native aneurysm of left common iliac artery measuring 3.3 cm. Mildly enlarged prostate.   SKELETON: No focal hypermetabolic bone lesions to suggest skeletal metastasis.   Incidental CT findings:  Left hip prosthesis noted.   IMPRESSION: Hypermetabolic mediastinal lymph nodes in the right cardiophrenic angle and retrosternal anterior mediastinum. Differential diagnosis includes lymphoproliferative disorder, inflammatory or infectious etiologies, and less likely metastatic disease.   Stable 5 mm right upper lobe pulmonary nodule, without FDG uptake suggesting benign etiology. Recommend continued attention on follow-up imaging.      Electronically Signed   By: Blake Padilla M.D.   On: 08/03/2023 09:09   I personally reviewed the CT and PET/CT images.  1.7 cm node in the right pericardial fat pad with SUV of 5.6.  Smaller nodule anterior to the heart in the anterior mediastinal fat pad with SUV of 2.7.  Severe coronary calcification.  Multiple small stable lung nodules.  Impression: Blake Padilla is a 73 year old man with a history of tobacco use (quit 2 years ago), COPD, coronary artery disease, hypertension, hyperlipidemia, iliac artery aneurysm, arthritis, and panic attacks.  Mediastinal adenopathy-has a large hypermetabolic node in the right pericardial fat pad and a smaller less hypermetabolic node in the anterior mediastinal fat pad.  Neither of these are accessible for mediastinoscopy.  Best option for biopsy and removal is a robotic right VATS approach.  I described the proposed procedure to [name].  He understands the general nature of the procedure.  I informed him of the need for general anesthesia, the incisions to be used, the use of the surgical robot, use of the drain postoperatively, the expected hospital stay (overnight), and the overall recovery.  I informed him of the indications, risks, benefits, and alternatives.  He understands the risks include, but not limited to death, MI, DVT, PE, bleeding, possible need for transfusion, infection, air leak, cardiac arrhythmias, as well as possibility of other unforeseeable complications.  He is willing to accept the risks and agrees to proceed.  He is having cataract surgery in about 2 weeks and has had a second cataract surgery on July 9.  He wants to wait until after those before having the surgery.  He does have coronary disease.  He is scheduled to see Dr. Junnie Olives in July although I do not see an appointment listed in epic currently.  We will need cardiac clearance prior to the procedure.  Plan: Robotic assisted right VATS for resection of right  pericardial fat  pad lymph node and anterior mediastinal lymph node  Zelphia Higashi, MD [facility] [phone]

## 2023-08-10 ENCOUNTER — Other Ambulatory Visit: Payer: Self-pay | Admitting: *Deleted

## 2023-08-10 ENCOUNTER — Encounter: Payer: Self-pay | Admitting: *Deleted

## 2023-08-10 DIAGNOSIS — R590 Localized enlarged lymph nodes: Secondary | ICD-10-CM

## 2023-09-09 NOTE — Pre-Procedure Instructions (Signed)
 Surgical Instructions   Your procedure is scheduled on September 14, 2023. Report to [facility] Main Entrance A at 6:00 A.M., then check in with the Admitting office. Any questions or running late day of surgery: call [phone]  Questions prior to your surgery date: call [phone], Monday-Friday, 8am-4pm. If you experience any cold or flu symptoms such as cough, fever, chills, shortness of breath, etc. between now and your scheduled surgery, please notify us  at the above number.     Remember:  Do not eat or drink after midnight the night before your surgery   Take these medicines the morning of surgery with A SIP OF WATER: amLODipine  (NORVASC )  cetirizine (ZYRTEC)  ezetimibe  (ZETIA )  pravastatin  (PRAVACHOL )  prednisoLONE  acetate (PRED FORTE ) 1 % ophthalmic suspension    Take these medicines the morning of surgery IF NEEDED: NEXIUM    Continue taking your Aspirin  through the day before surgery. DO NOT take any the morning of surgery.   One week prior to surgery, STOP taking any Aleve, Naproxen, Ibuprofen, Motrin, Advil, Goody's, BC's, all herbal medications, fish oil, and non-prescription vitamins.                     Do NOT Smoke (Tobacco/Vaping) for 24 hours prior to your procedure.  If you use a CPAP at night, you may bring your mask/headgear for your overnight stay.   You will be asked to remove any contacts, glasses, piercing's, hearing aid's, dentures/partials prior to surgery. Please bring cases for these items if needed.    Patients discharged the day of surgery will not be allowed to drive home, and someone needs to stay with them for 24 hours.  SURGICAL WAITING ROOM VISITATION Patients may have no more than 2 support people in the waiting area - these visitors may rotate.   Pre-op nurse will coordinate an appropriate time for 1 ADULT support person, who may not rotate, to accompany patient in pre-op.  Children under the age of 71 must have an adult with them  who is not the patient and must remain in the main waiting area with an adult.  If the patient needs to stay at the hospital during part of their recovery, the visitor guidelines for inpatient rooms apply.  Please refer to the [facility] website for the visitor guidelines for any additional information.   If you received a COVID test during your pre-op visit  it is requested that you wear a mask when out in public, stay away from anyone that may not be feeling well and notify your surgeon if you develop symptoms. If you have been in contact with anyone that has tested positive in the last 10 days please notify you surgeon.      Pre-operative CHG Bathing Instructions   You can play a key role in reducing the risk of infection after surgery. Your skin needs to be as free of germs as possible. You can reduce the number of germs on your skin by washing with CHG (chlorhexidine  gluconate) soap before surgery. CHG is an antiseptic soap that kills germs and continues to kill germs even after washing.   DO NOT use if you have an allergy to chlorhexidine /CHG or antibacterial soaps. If your skin becomes reddened or irritated, stop using the CHG and notify one of our RNs at [phone].              TAKE A SHOWER THE NIGHT BEFORE SURGERY AND THE DAY OF SURGERY  Please keep in mind the following:  DO NOT shave, including legs and underarms, 48 hours prior to surgery.   You may shave your face before/day of surgery.  Place clean sheets on your bed the night before surgery Use a clean washcloth (not used since being washed) for each shower. DO NOT sleep with pet's night before surgery.  CHG Shower Instructions:  Wash your face and private area with normal soap. If you choose to wash your hair, wash first with your normal shampoo.  After you use shampoo/soap, rinse your hair and body thoroughly to remove shampoo/soap residue.  Turn the water OFF and apply half the bottle of CHG soap to a CLEAN  washcloth.  Apply CHG soap ONLY FROM YOUR NECK DOWN TO YOUR TOES (washing for 3-5 minutes)  DO NOT use CHG soap on face, private areas, open wounds, or sores.  Pay special attention to the area where your surgery is being performed.  If you are having back surgery, having someone wash your back for you may be helpful. Wait 2 minutes after CHG soap is applied, then you may rinse off the CHG soap.  Pat dry with a clean towel  Put on clean pajamas    Additional instructions for the day of surgery: DO NOT APPLY any lotions, deodorants, cologne, or perfumes.   Do not wear jewelry or makeup Do not wear nail polish, gel polish, artificial nails, or any other type of covering on natural nails (fingers and toes) Do not bring valuables to the hospital. [facility] is not responsible for valuables/personal belongings. Put on clean/comfortable clothes.  Please brush your teeth.  Ask your nurse before applying any prescription medications to the skin.

## 2023-09-10 ENCOUNTER — Encounter (HOSPITAL_COMMUNITY): Payer: Self-pay

## 2023-09-10 ENCOUNTER — Other Ambulatory Visit: Payer: Self-pay

## 2023-09-10 ENCOUNTER — Encounter (HOSPITAL_COMMUNITY)
Admission: RE | Admit: 2023-09-10 | Discharge: 2023-09-10 | Disposition: A | Discharge status: Home / Self Care | Source: Ambulatory Visit | Attending: Thoracic Surgery (Cardiothoracic Vascular Surgery) | Admitting: Thoracic Surgery (Cardiothoracic Vascular Surgery)

## 2023-09-10 VITALS — BP 138/72 | HR 72 | Temp 98.2°F | Resp 17 | Ht 75.0 in | Wt 232.4 lb

## 2023-09-10 DIAGNOSIS — R590 Localized enlarged lymph nodes: Secondary | ICD-10-CM | POA: Insufficient documentation

## 2023-09-10 DIAGNOSIS — Z01818 Encounter for other preprocedural examination: Secondary | ICD-10-CM | POA: Diagnosis present

## 2023-09-10 DIAGNOSIS — I491 Atrial premature depolarization: Secondary | ICD-10-CM | POA: Insufficient documentation

## 2023-09-10 HISTORY — DX: Gastro-esophageal reflux disease without esophagitis: K21.9

## 2023-09-10 HISTORY — DX: Peripheral vascular disease, unspecified: I73.9

## 2023-09-10 LAB — URINALYSIS, ROUTINE W REFLEX MICROSCOPIC
Bilirubin Urine: NEGATIVE
Glucose, UA: NEGATIVE mg/dL
Hgb urine dipstick: NEGATIVE
Ketones, ur: NEGATIVE mg/dL
Leukocytes,Ua: NEGATIVE
Nitrite: NEGATIVE
Protein, ur: 100 mg/dL — AB
Specific Gravity, Urine: 1.031 — ABNORMAL HIGH (ref 1.005–1.030)
pH: 5 (ref 5.0–8.0)

## 2023-09-10 LAB — SURGICAL PCR SCREEN
MRSA, PCR: NEGATIVE
Staphylococcus aureus: NEGATIVE

## 2023-09-10 LAB — COMPREHENSIVE METABOLIC PANEL WITH GFR
ALT: 18 U/L (ref 0–44)
AST: 22 U/L (ref 15–41)
Albumin: 3.8 g/dL (ref 3.5–5.0)
Alkaline Phosphatase: 89 U/L (ref 38–126)
Anion gap: 8 (ref 5–15)
BUN: 16 mg/dL (ref 8–23)
CO2: 26 mmol/L (ref 22–32)
Calcium: 9 mg/dL (ref 8.9–10.3)
Chloride: 106 mmol/L (ref 98–111)
Creatinine, Ser: 1.18 mg/dL (ref 0.61–1.24)
GFR, Estimated: >60 mL/min (ref >60)
Glucose, Bld: 116 mg/dL — ABNORMAL HIGH (ref 70–99)
Potassium: 4.2 mmol/L (ref 3.5–5.1)
Sodium: 140 mmol/L (ref 135–145)
Total Bilirubin: 1.1 mg/dL (ref 0.0–1.2)
Total Protein: 7.2 g/dL (ref 6.5–8.1)

## 2023-09-10 LAB — TYPE AND SCREEN
ABO/RH(D): B POS
Antibody Screen: NEGATIVE

## 2023-09-10 LAB — CBC
HCT: 39 % (ref 39.0–52.0)
Hemoglobin: 12.4 g/dL — ABNORMAL LOW (ref 13.0–17.0)
MCH: 28.8 pg (ref 26.0–34.0)
MCHC: 31.8 g/dL (ref 30.0–36.0)
MCV: 90.7 fL (ref 80.0–100.0)
Platelets: 147 K/uL — ABNORMAL LOW (ref 150–400)
RBC: 4.3 MIL/uL (ref 4.22–5.81)
RDW: 13.5 % (ref 11.5–15.5)
WBC: 12.5 K/uL — ABNORMAL HIGH (ref 4.0–10.5)
nRBC: 0 % (ref 0.0–0.2)

## 2023-09-10 LAB — PROTIME-INR
INR: 1.1 (ref 0.8–1.2)
Prothrombin Time: 14.5 s (ref 11.4–15.2)

## 2023-09-10 LAB — APTT: aPTT: 33 s (ref 24–36)

## 2023-09-10 NOTE — Progress Notes (Signed)
 PCP - Dr. Rock Pounds Cardiologist - Dr. Redell Cave - last office visit 10/21/2022  PPM/ICD - Denies Device Orders - n/a Rep Notified - n/a  Chest x-ray - to be completed DOS EKG - 09/10/2023 Stress Test - Denies ECHO - 03/29/2020 Cardiac Cath - Denies  Sleep Study - Denies CPAP - n/a  No DM  Last dose of GLP1 agonist- n/a GLP1 instructions: n/a  Blood Thinner Instructions: n/a Aspirin  Instructions: Pt instructed to continue taking ASA through the day before surgery and none the morning of surgery  NPO after midnight   COVID TEST- n/a   Anesthesia review: Yes. Hx of HTN, CAD, COPD. Pt does not see Pulmonologist but is followed by Lauraine Lites, NP for CT Lung Screening.   Patient denies shortness of breath, fever, cough and chest pain at PAT appointment. Pt denies any respiratory illness/infection in the last two months.   All instructions explained to the patient, with a verbal understanding of the material. Patient agrees to go over the instructions while at home for a better understanding. Patient also instructed to self quarantine after being tested for COVID-19. The opportunity to ask questions was provided.

## 2023-09-14 ENCOUNTER — Inpatient Hospital Stay (HOSPITAL_COMMUNITY): Payer: Self-pay | Admitting: Anesthesiology

## 2023-09-14 ENCOUNTER — Encounter (HOSPITAL_COMMUNITY)
Admission: RE | Disposition: A | Discharge status: Home / Self Care | Payer: Self-pay | Source: Home / Self Care | Attending: Thoracic Surgery (Cardiothoracic Vascular Surgery)

## 2023-09-14 ENCOUNTER — Inpatient Hospital Stay (HOSPITAL_COMMUNITY)

## 2023-09-14 ENCOUNTER — Other Ambulatory Visit: Payer: Self-pay

## 2023-09-14 ENCOUNTER — Inpatient Hospital Stay (HOSPITAL_COMMUNITY)
Admission: RE | Admit: 2023-09-14 | Discharge: 2023-09-15 | DRG: 820 | Disposition: A | Discharge status: Home / Self Care | Attending: Thoracic Surgery (Cardiothoracic Vascular Surgery) | Admitting: Thoracic Surgery (Cardiothoracic Vascular Surgery)

## 2023-09-14 ENCOUNTER — Encounter (HOSPITAL_COMMUNITY): Payer: Self-pay | Admitting: Thoracic Surgery (Cardiothoracic Vascular Surgery)

## 2023-09-14 ENCOUNTER — Inpatient Hospital Stay (HOSPITAL_COMMUNITY): Payer: Self-pay | Admitting: Physician Assistant

## 2023-09-14 DIAGNOSIS — K219 Gastro-esophageal reflux disease without esophagitis: Secondary | ICD-10-CM | POA: Diagnosis present

## 2023-09-14 DIAGNOSIS — D696 Thrombocytopenia, unspecified: Secondary | ICD-10-CM | POA: Diagnosis present

## 2023-09-14 DIAGNOSIS — Z79899 Other long term (current) drug therapy: Secondary | ICD-10-CM | POA: Diagnosis not present

## 2023-09-14 DIAGNOSIS — C8312 Mantle cell lymphoma, intrathoracic lymph nodes: Secondary | ICD-10-CM | POA: Diagnosis present

## 2023-09-14 DIAGNOSIS — Z833 Family history of diabetes mellitus: Secondary | ICD-10-CM | POA: Diagnosis not present

## 2023-09-14 DIAGNOSIS — J449 Chronic obstructive pulmonary disease, unspecified: Secondary | ICD-10-CM | POA: Diagnosis present

## 2023-09-14 DIAGNOSIS — R590 Localized enlarged lymph nodes: Secondary | ICD-10-CM | POA: Diagnosis present

## 2023-09-14 DIAGNOSIS — Z8249 Family history of ischemic heart disease and other diseases of the circulatory system: Secondary | ICD-10-CM | POA: Diagnosis not present

## 2023-09-14 DIAGNOSIS — D62 Acute posthemorrhagic anemia: Secondary | ICD-10-CM | POA: Diagnosis not present

## 2023-09-14 DIAGNOSIS — J9859 Other diseases of mediastinum, not elsewhere classified: Secondary | ICD-10-CM | POA: Diagnosis present

## 2023-09-14 DIAGNOSIS — Z7982 Long term (current) use of aspirin: Secondary | ICD-10-CM

## 2023-09-14 DIAGNOSIS — I2510 Atherosclerotic heart disease of native coronary artery without angina pectoris: Secondary | ICD-10-CM

## 2023-09-14 DIAGNOSIS — Z87891 Personal history of nicotine dependence: Secondary | ICD-10-CM

## 2023-09-14 DIAGNOSIS — I739 Peripheral vascular disease, unspecified: Secondary | ICD-10-CM | POA: Diagnosis present

## 2023-09-14 DIAGNOSIS — I10 Essential (primary) hypertension: Secondary | ICD-10-CM

## 2023-09-14 DIAGNOSIS — Z825 Family history of asthma and other chronic lower respiratory diseases: Secondary | ICD-10-CM | POA: Diagnosis not present

## 2023-09-14 DIAGNOSIS — Z96642 Presence of left artificial hip joint: Secondary | ICD-10-CM | POA: Diagnosis present

## 2023-09-14 DIAGNOSIS — Z9889 Other specified postprocedural states: Principal | ICD-10-CM

## 2023-09-14 DIAGNOSIS — Z806 Family history of leukemia: Secondary | ICD-10-CM | POA: Diagnosis not present

## 2023-09-14 HISTORY — PX: EXCISION, MASS, MEDIASTINUM, ROBOT-ASSISTED: SHX7566

## 2023-09-14 HISTORY — PX: INTERCOSTAL NERVE BLOCK: SHX5021

## 2023-09-14 SURGERY — EXCISION, MASS, MEDIASTINUM, ROBOT-ASSISTED
Anesthesia: General | Site: Chest | Laterality: Right

## 2023-09-14 MED ORDER — MIDAZOLAM HCL 2 MG/2ML IJ SOLN
1.0000 mg | Freq: Once | INTRAMUSCULAR | Status: AC
  Administered 2023-09-14: 1 mg via INTRAVENOUS

## 2023-09-14 MED ORDER — ONDANSETRON HCL 4 MG/2ML IJ SOLN
INTRAMUSCULAR | Status: AC
Start: 2023-09-14 — End: 2023-09-14
  Filled 2023-09-14: qty 2

## 2023-09-14 MED ORDER — LABETALOL HCL 5 MG/ML IV SOLN
INTRAVENOUS | Status: DC | PRN
  Administered 2023-09-14: 5 mg via INTRAVENOUS

## 2023-09-14 MED ORDER — FENTANYL CITRATE (PF) 100 MCG/2ML IJ SOLN
25.0000 ug | INTRAMUSCULAR | Status: DC | PRN
  Administered 2023-09-14 (×3): 50 ug via INTRAVENOUS

## 2023-09-14 MED ORDER — KETOROLAC TROMETHAMINE 0.5 % OP SOLN
1.0000 [drp] | Freq: Four times a day (QID) | OPHTHALMIC | Status: DC
  Administered 2023-09-14 (×2): 1 [drp] via OPHTHALMIC
  Filled 2023-09-14: qty 5

## 2023-09-14 MED ORDER — 0.9 % SODIUM CHLORIDE (POUR BTL) OPTIME
TOPICAL | Status: DC | PRN
  Administered 2023-09-14: 1000 mL

## 2023-09-14 MED ORDER — FENTANYL CITRATE (PF) 250 MCG/5ML IJ SOLN
INTRAMUSCULAR | Status: DC | PRN
  Administered 2023-09-14: 100 ug via INTRAVENOUS
  Administered 2023-09-14 (×3): 50 ug via INTRAVENOUS

## 2023-09-14 MED ORDER — PROPOFOL 10 MG/ML IV BOLUS
INTRAVENOUS | Status: AC
  Filled 2023-09-14: qty 20

## 2023-09-14 MED ORDER — SODIUM CHLORIDE (PF) 0.9 % IJ SOLN
INTRAMUSCULAR | Status: AC
  Filled 2023-09-14: qty 50

## 2023-09-14 MED ORDER — ASPIRIN 81 MG PO TBEC
81.0000 mg | DELAYED_RELEASE_TABLET | Freq: Every day | ORAL | Status: DC
  Administered 2023-09-15: 81 mg via ORAL
  Filled 2023-09-14: qty 1

## 2023-09-14 MED ORDER — PHENYLEPHRINE HCL-NACL 20-0.9 MG/250ML-% IV SOLN
INTRAVENOUS | Status: DC | PRN
  Administered 2023-09-14: 40 ug/min via INTRAVENOUS

## 2023-09-14 MED ORDER — DEXTROSE-SODIUM CHLORIDE 5-0.9 % IV SOLN: INTRAVENOUS | Status: DC

## 2023-09-14 MED ORDER — LIDOCAINE 2% (20 MG/ML) 5 ML SYRINGE
INTRAMUSCULAR | Status: DC | PRN
  Administered 2023-09-14: 100 mg via INTRAVENOUS

## 2023-09-14 MED ORDER — AMLODIPINE BESYLATE 5 MG PO TABS
5.0000 mg | ORAL_TABLET | Freq: Every day | ORAL | Status: DC
  Administered 2023-09-15: 5 mg via ORAL
  Filled 2023-09-14: qty 1

## 2023-09-14 MED ORDER — ONDANSETRON HCL 4 MG/2ML IJ SOLN
INTRAMUSCULAR | Status: AC
  Filled 2023-09-14: qty 2

## 2023-09-14 MED ORDER — PRAVASTATIN SODIUM 40 MG PO TABS
20.0000 mg | ORAL_TABLET | Freq: Every day | ORAL | Status: DC
  Administered 2023-09-15: 20 mg via ORAL
  Filled 2023-09-14: qty 1

## 2023-09-14 MED ORDER — HYDROMORPHONE HCL 1 MG/ML IJ SOLN
INTRAMUSCULAR | Status: AC
  Filled 2023-09-14: qty 1

## 2023-09-14 MED ORDER — PHENYLEPHRINE 80 MCG/ML (10ML) SYRINGE FOR IV PUSH (FOR BLOOD PRESSURE SUPPORT)
PREFILLED_SYRINGE | INTRAVENOUS | Status: DC | PRN
  Administered 2023-09-14: 80 ug via INTRAVENOUS

## 2023-09-14 MED ORDER — DEXAMETHASONE SODIUM PHOSPHATE 10 MG/ML IJ SOLN
INTRAMUSCULAR | Status: DC | PRN
  Administered 2023-09-14: 10 mg via INTRAVENOUS

## 2023-09-14 MED ORDER — LACTATED RINGERS IV SOLN: INTRAVENOUS | Status: DC

## 2023-09-14 MED ORDER — KETOROLAC TROMETHAMINE 15 MG/ML IJ SOLN
INTRAMUSCULAR | Status: AC
Start: 2023-09-14 — End: 2023-09-14
  Filled 2023-09-14: qty 1

## 2023-09-14 MED ORDER — FENTANYL CITRATE (PF) 100 MCG/2ML IJ SOLN
INTRAMUSCULAR | Status: AC
  Filled 2023-09-14: qty 2

## 2023-09-14 MED ORDER — CEFAZOLIN SODIUM-DEXTROSE 2-4 GM/100ML-% IV SOLN
2.0000 g | Freq: Three times a day (TID) | INTRAVENOUS | Status: AC
  Administered 2023-09-14 (×2): 2 g via INTRAVENOUS
  Filled 2023-09-14 (×2): qty 100

## 2023-09-14 MED ORDER — FENTANYL CITRATE (PF) 250 MCG/5ML IJ SOLN
INTRAMUSCULAR | Status: AC
  Filled 2023-09-14: qty 5

## 2023-09-14 MED ORDER — TRAMADOL HCL 50 MG PO TABS
50.0000 mg | ORAL_TABLET | Freq: Four times a day (QID) | ORAL | Status: DC | PRN
  Administered 2023-09-15: 50 mg via ORAL
  Filled 2023-09-14: qty 1

## 2023-09-14 MED ORDER — ROCURONIUM BROMIDE 10 MG/ML (PF) SYRINGE
PREFILLED_SYRINGE | INTRAVENOUS | Status: AC
Start: 2023-09-14 — End: 2023-09-14
  Filled 2023-09-14: qty 10

## 2023-09-14 MED ORDER — SUGAMMADEX SODIUM 200 MG/2ML IV SOLN
INTRAVENOUS | Status: AC
  Filled 2023-09-14: qty 2

## 2023-09-14 MED ORDER — PHENYLEPHRINE 80 MCG/ML (10ML) SYRINGE FOR IV PUSH (FOR BLOOD PRESSURE SUPPORT)
PREFILLED_SYRINGE | INTRAVENOUS | Status: AC
  Filled 2023-09-14: qty 10

## 2023-09-14 MED ORDER — EZETIMIBE 10 MG PO TABS
10.0000 mg | ORAL_TABLET | Freq: Every day | ORAL | Status: DC
  Administered 2023-09-15: 10 mg via ORAL
  Filled 2023-09-14: qty 1

## 2023-09-14 MED ORDER — ACETAMINOPHEN 10 MG/ML IV SOLN
INTRAVENOUS | Status: AC
  Filled 2023-09-14: qty 100

## 2023-09-14 MED ORDER — CHLORHEXIDINE GLUCONATE 0.12 % MT SOLN
OROMUCOSAL | Status: AC
  Administered 2023-09-14: 15 mL via OROMUCOSAL
  Filled 2023-09-14: qty 15

## 2023-09-14 MED ORDER — ONDANSETRON HCL 4 MG/2ML IJ SOLN
INTRAMUSCULAR | Status: DC | PRN
  Administered 2023-09-14: 4 mg via INTRAVENOUS

## 2023-09-14 MED ORDER — LIDOCAINE 2% (20 MG/ML) 5 ML SYRINGE
INTRAMUSCULAR | Status: AC
  Filled 2023-09-14: qty 5

## 2023-09-14 MED ORDER — PREDNISOLONE ACETATE 1 % OP SUSP
1.0000 [drp] | Freq: Four times a day (QID) | OPHTHALMIC | Status: DC
  Administered 2023-09-14 (×2): 1 [drp] via OPHTHALMIC
  Filled 2023-09-14: qty 5

## 2023-09-14 MED ORDER — BISACODYL 5 MG PO TBEC
10.0000 mg | DELAYED_RELEASE_TABLET | Freq: Every day | ORAL | Status: DC
  Administered 2023-09-14 – 2023-09-15 (×2): 10 mg via ORAL
  Filled 2023-09-14 (×2): qty 2

## 2023-09-14 MED ORDER — CEFAZOLIN SODIUM-DEXTROSE 2-4 GM/100ML-% IV SOLN
INTRAVENOUS | Status: AC
  Filled 2023-09-14: qty 100

## 2023-09-14 MED ORDER — ORAL CARE MOUTH RINSE: 15.0000 mL | Freq: Once | OROMUCOSAL | Status: AC

## 2023-09-14 MED ORDER — BUPIVACAINE LIPOSOME 1.3 % IJ SUSP
INTRAMUSCULAR | Status: DC | PRN
  Administered 2023-09-14: 14 mL

## 2023-09-14 MED ORDER — PROPOFOL 10 MG/ML IV BOLUS
INTRAVENOUS | Status: DC | PRN
  Administered 2023-09-14: 20 mg via INTRAVENOUS
  Administered 2023-09-14: 180 mg via INTRAVENOUS
  Administered 2023-09-14: 30 mg via INTRAVENOUS

## 2023-09-14 MED ORDER — ACETAMINOPHEN 10 MG/ML IV SOLN
1000.0000 mg | Freq: Once | INTRAVENOUS | Status: DC | PRN
  Administered 2023-09-14: 1000 mg via INTRAVENOUS

## 2023-09-14 MED ORDER — ROCURONIUM BROMIDE 10 MG/ML (PF) SYRINGE
PREFILLED_SYRINGE | INTRAVENOUS | Status: DC | PRN
  Administered 2023-09-14: 70 mg via INTRAVENOUS
  Administered 2023-09-14: 10 mg via INTRAVENOUS

## 2023-09-14 MED ORDER — MIDAZOLAM HCL 2 MG/2ML IJ SOLN
INTRAMUSCULAR | Status: AC
  Filled 2023-09-14: qty 2

## 2023-09-14 MED ORDER — KETOROLAC TROMETHAMINE 30 MG/ML IJ SOLN
15.0000 mg | Freq: Four times a day (QID) | INTRAMUSCULAR | Status: DC | PRN
Start: 2023-09-14 — End: 2023-09-14
  Administered 2023-09-14: 15 mg via INTRAVENOUS

## 2023-09-14 MED ORDER — ESMOLOL HCL 100 MG/10ML IV SOLN
INTRAVENOUS | Status: DC | PRN
  Administered 2023-09-14: 30 mg via INTRAVENOUS

## 2023-09-14 MED ORDER — HYDROMORPHONE HCL 1 MG/ML IJ SOLN: 0.2500 mg | INTRAMUSCULAR | Status: DC | PRN

## 2023-09-14 MED ORDER — BUPIVACAINE LIPOSOME 1.3 % IJ SUSP
INTRAMUSCULAR | Status: AC
  Filled 2023-09-14: qty 20

## 2023-09-14 MED ORDER — BUPIVACAINE HCL (PF) 0.5 % IJ SOLN
INTRAMUSCULAR | Status: AC
  Filled 2023-09-14: qty 30

## 2023-09-14 MED ORDER — MORPHINE SULFATE (PF) 2 MG/ML IV SOLN
2.0000 mg | INTRAVENOUS | Status: DC | PRN
  Administered 2023-09-14 – 2023-09-15 (×3): 2 mg via INTRAVENOUS
  Filled 2023-09-14 (×3): qty 1

## 2023-09-14 MED ORDER — OXYCODONE HCL 5 MG PO TABS
5.0000 mg | ORAL_TABLET | ORAL | Status: DC | PRN
  Administered 2023-09-15: 10 mg via ORAL
  Filled 2023-09-14: qty 2

## 2023-09-14 MED ORDER — PANTOPRAZOLE SODIUM 40 MG PO TBEC
40.0000 mg | DELAYED_RELEASE_TABLET | Freq: Every day | ORAL | Status: DC
  Administered 2023-09-14 – 2023-09-15 (×2): 40 mg via ORAL
  Filled 2023-09-14 (×2): qty 1

## 2023-09-14 MED ORDER — ENOXAPARIN SODIUM 40 MG/0.4ML IJ SOSY
40.0000 mg | PREFILLED_SYRINGE | Freq: Every day | INTRAMUSCULAR | Status: DC
  Administered 2023-09-14: 40 mg via SUBCUTANEOUS
  Filled 2023-09-14: qty 0.4

## 2023-09-14 MED ORDER — CHLORHEXIDINE GLUCONATE 0.12 % MT SOLN: 15.0000 mL | Freq: Once | OROMUCOSAL | Status: AC

## 2023-09-14 MED ORDER — SENNOSIDES-DOCUSATE SODIUM 8.6-50 MG PO TABS
1.0000 | ORAL_TABLET | Freq: Every day | ORAL | Status: DC
  Filled 2023-09-14: qty 1

## 2023-09-14 MED ORDER — LORATADINE 10 MG PO TABS
10.0000 mg | ORAL_TABLET | Freq: Every day | ORAL | Status: DC
  Administered 2023-09-15: 10 mg via ORAL
  Filled 2023-09-14: qty 1

## 2023-09-14 MED ORDER — SODIUM CHLORIDE 0.9 % IV SOLN: INTRAVENOUS | Status: DC | PRN

## 2023-09-14 MED ORDER — CEFAZOLIN SODIUM-DEXTROSE 2-4 GM/100ML-% IV SOLN
2.0000 g | INTRAVENOUS | Status: AC
  Administered 2023-09-14: 2 g via INTRAVENOUS

## 2023-09-14 MED ORDER — ACETAMINOPHEN 500 MG PO TABS
1000.0000 mg | ORAL_TABLET | Freq: Four times a day (QID) | ORAL | Status: DC
Start: 2023-09-14 — End: 2023-09-15
  Administered 2023-09-14 – 2023-09-15 (×3): 1000 mg via ORAL
  Filled 2023-09-14 (×3): qty 2

## 2023-09-14 MED ORDER — ONDANSETRON HCL 4 MG/2ML IJ SOLN: 4.0000 mg | Freq: Four times a day (QID) | INTRAMUSCULAR | Status: DC | PRN

## 2023-09-14 MED ORDER — ACETAMINOPHEN 160 MG/5ML PO SOLN: 1000.0000 mg | Freq: Four times a day (QID) | ORAL | Status: DC

## 2023-09-14 MED ORDER — SUGAMMADEX SODIUM 200 MG/2ML IV SOLN
INTRAVENOUS | Status: DC | PRN
  Administered 2023-09-14: 200 mg via INTRAVENOUS

## 2023-09-14 SURGICAL SUPPLY — 64 items
BLADE STERNUM SYSTEM 6 (BLADE) IMPLANT
CANNULA REDUCER 12-8 DVNC XI (CANNULA) IMPLANT
CNTNR URN SCR LID CUP LEK RST (MISCELLANEOUS) ×8 IMPLANT
DEFOGGER SCOPE WARM SEASHARP (MISCELLANEOUS) ×2 IMPLANT
DERMABOND ADVANCED .7 DNX12 (GAUZE/BANDAGES/DRESSINGS) IMPLANT
DRAIN CHANNEL 19F RND (DRAIN) IMPLANT
DRAIN CHANNEL 28F RND 3/8 FF (WOUND CARE) ×2 IMPLANT
DRAIN CONNECTOR BLAKE 1:1 (MISCELLANEOUS) IMPLANT
DRAPE ARM DVNC X/XI (DISPOSABLE) ×8 IMPLANT
DRAPE COLUMN DVNC XI (DISPOSABLE) ×2 IMPLANT
DRAPE CV SPLIT W-CLR ANES SCRN (DRAPES) ×2 IMPLANT
DRAPE HALF SHEET 40X57 (DRAPES) IMPLANT
DRAPE SURG ORHT 6 SPLT 77X108 (DRAPES) ×2 IMPLANT
DRIVER NDL MEGA SUTCUT DVNCXI (INSTRUMENTS) IMPLANT
DRIVER NDLE MEGA SUTCUT DVNCXI (INSTRUMENTS) IMPLANT
ELECTRODE REM PT RTRN 9FT ADLT (ELECTROSURGICAL) ×2 IMPLANT
FELT TEFLON 1X6 (MISCELLANEOUS) IMPLANT
FORCEPS BPLR FENES DVNC XI (FORCEP) IMPLANT
FORCEPS BPLR LNG DVNC XI (INSTRUMENTS) IMPLANT
GAUZE KITTNER 4X8 (MISCELLANEOUS) ×4 IMPLANT
GAUZE SPONGE 4X4 12PLY STRL (GAUZE/BANDAGES/DRESSINGS) IMPLANT
GLOVE SS BIOGEL STRL SZ 7.5 (GLOVE) ×4 IMPLANT
GLOVE SURG SS PI 8.0 STRL IVOR (GLOVE) ×2 IMPLANT
GOWN STRL REUS W/ TWL LRG LVL3 (GOWN DISPOSABLE) ×2 IMPLANT
GOWN STRL REUS W/ TWL XL LVL3 (GOWN DISPOSABLE) ×4 IMPLANT
GOWN STRL REUS W/TWL 2XL LVL3 (GOWN DISPOSABLE) ×2 IMPLANT
GRASPER TIP-UP FEN DVNC XI (INSTRUMENTS) IMPLANT
HEMOSTAT SURGICEL 2X14 (HEMOSTASIS) ×4 IMPLANT
IRRIGATION STRYKERFLOW (MISCELLANEOUS) ×2 IMPLANT
IV SODIUM CHL 0.9% 500ML (IV SOLUTION) ×2 IMPLANT
KIT SUCTION CATH 14FR (SUCTIONS) IMPLANT
NDL 25GX 5/8IN NON SAFETY (NEEDLE) ×2 IMPLANT
NDL HYPO 25GX1X1/2 BEV (NEEDLE) ×2 IMPLANT
NDL SPNL 22GX3.5 QUINCKE BK (NEEDLE) ×2 IMPLANT
NEEDLE 25GX 5/8IN NON SAFETY (NEEDLE) ×2 IMPLANT
NEEDLE HYPO 25GX1X1/2 BEV (NEEDLE) ×2 IMPLANT
NEEDLE SPNL 22GX3.5 QUINCKE BK (NEEDLE) ×2 IMPLANT
PACK CHEST (CUSTOM PROCEDURE TRAY) ×2 IMPLANT
PAD ARMBOARD POSITIONER FOAM (MISCELLANEOUS) ×4 IMPLANT
PAD ELECT DEFIB RADIOL ZOLL (MISCELLANEOUS) IMPLANT
PORT ACCESS TROCAR AIRSEAL 12 (TROCAR) ×2 IMPLANT
SEAL UNIV 5-12 XI (MISCELLANEOUS) ×6 IMPLANT
SET TRI-LUMEN FLTR TB AIRSEAL (TUBING) ×2 IMPLANT
SOLUTION ELECTROSURG ANTI STCK (MISCELLANEOUS) ×2 IMPLANT
SPONGE TONSIL 1 RF SGL (DISPOSABLE) ×2 IMPLANT
SUT PROLENE 4 0 SH DA (SUTURE) IMPLANT
SUT PROLENE 4-0 RB1 .5 CRCL 36 (SUTURE) IMPLANT
SUT SILK 1 MH (SUTURE) ×2 IMPLANT
SUT SILK 2 0 SH (SUTURE) IMPLANT
SUT SILK 2 0 SH CR/8 (SUTURE) IMPLANT
SUT STEEL 6MS V (SUTURE) IMPLANT
SUT STEEL SZ 6 DBL 3X14 BALL (SUTURE) IMPLANT
SUT VIC AB 1 CTX36XBRD ANBCTR (SUTURE) ×2 IMPLANT
SUT VIC AB 2-0 CTX 36 (SUTURE) ×2 IMPLANT
SUT VIC AB 3-0 X1 27 (SUTURE) ×4 IMPLANT
SUT VICRYL 0 TIES 12 18 (SUTURE) ×2 IMPLANT
SUT VICRYL 0 UR6 27IN ABS (SUTURE) ×4 IMPLANT
SYR 20CC LL (SYRINGE) ×4 IMPLANT
SYSTEM RETRIEVAL ANCHOR 8 (MISCELLANEOUS) IMPLANT
SYSTEM SAHARA CHEST DRAIN ATS (WOUND CARE) IMPLANT
TAPE CLOTH 4X10 WHT NS (GAUZE/BANDAGES/DRESSINGS) IMPLANT
TOWEL GREEN STERILE (TOWEL DISPOSABLE) IMPLANT
TOWEL GREEN STERILE FF (TOWEL DISPOSABLE) IMPLANT
TRAY FOLEY SLVR 16FR TEMP STAT (SET/KITS/TRAYS/PACK) ×2 IMPLANT

## 2023-09-14 NOTE — Hospital Course (Signed)
 PCP is Buren Rock HERO, MD Referring Provider is Ruthell Lauraine FALCON, NP   HPI: Blake Padilla is sent for consultation regarding mediastinal adenopathy.   Blake Padilla is a 73 year old man with a history of tobacco use (quit 2 years ago), COPD, coronary artery disease, hypertension, hyperlipidemia, iliac artery aneurysm, arthritis, and panic attacks.   He is followed in the low-dose CT for lung cancer screening program at [facility].  In April his scan showed a 1.7 cm lymph node in the right pericardial fat pad at the cardiophrenic angle.  There were multiple small lung nodules bilaterally that have been present previously and were unchanged.   He had a PET/CT which showed the node was hypermetabolic with an SUV of 5.6.  There was a smaller node in the anterior mediastinal fat pad with SUV of 2.7.   He has not had any fevers, chills, night sweats, chest pain, pressure, tightness, or shortness of breath.  Has been anxious about the findings.  Had food poisoning a couple weeks ago.  He has lost 15 pounds over the past 3 months.  Following full review of the patient and all relevant studies Dr. Kerrin recommended admission for elective robotic assisted thoracoscopic surgical resection of the mediastinal lymph nodes/fat pad.  Hospital course: The patient was admitted electively on 09/14/2023 at which time he was taken the operating room and underwent a right robotic assisted thoracoscopic resection of lymph nodes and mediastinal fat pad.  Initial pathology of frozen was consistent with possible lymphoma.  He tolerated the procedure well and was taken to the postanesthesia care unit in stable condition.  Postoperative hospital course:  Patient has done well.  He is maintained stable vital signs and is afebrile.  Incisions are healing with no evidence of infection.  His creatinine did bump slightly on postop day 1 to 1.35.  He has a mild acute blood loss anemia on chronic anemia.  Final pathology is  pending.  This will be discussed with the patient in the office next week.  He is ambulating without difficulty.  His pain is under good control.  He tolerated routine pulmonary hygiene measures.  Chest tubes was removed on postoperative day #1.  Stay was quite stable in appearance.  Overall, at the time of discharge the patient was felt to be quite stable.

## 2023-09-14 NOTE — TOC CM/SW Note (Signed)
 Transition of Care [facility]) - Inpatient Brief Assessment   Patient Details  Name: JAHIR HALT MRN: [id number] Date of Birth: May 11, 1950  [facility] CM/SW Contact:    Lauraine FORBES Saa, LCSW Phone Number: 09/14/2023, 1:29 PM   Clinical Narrative:  1:29 PM Per chart review, patient resides at home with spouse. Patient has a PCP and insurance. Patient does not have SNF history. Patient has HH history with [facility]. Patient has DME (rolling walker, BSC) history with [employer]. Patient's preferred pharmacy is [facility]. No TOC needs were identified at this time. TOC will continue to follow and be available to assist.  Transition of Care Asessment: Insurance and Status: Insurance coverage has been reviewed Patient has primary care physician: Yes Home environment has been reviewed: Private Residence Prior level of function:: N/A Prior/Current Home Services: No current home services Social Drivers of Health Review: SDOH reviewed no interventions necessary Readmission risk has been reviewed: Yes Transition of care needs: no transition of care needs at this time

## 2023-09-14 NOTE — Anesthesia Procedure Notes (Signed)
 Arterial Line Insertion Start/End7/21/2025 7:20 AM, 09/14/2023 7:22 AM Performed by: Emmitt Millman, CRNA, CRNA  Patient location: Pre-op. Preanesthetic checklist: patient identified, IV checked, site marked, risks and benefits discussed, surgical consent, monitors and equipment checked, pre-op evaluation, timeout performed and anesthesia consent Lidocaine  1% used for infiltration Left, radial was placed Catheter size: 20 G Hand hygiene performed , maximum sterile barriers used  and Seldinger technique used Allen's test indicative of satisfactory collateral circulation Attempts: 1 Procedure performed without using ultrasound guided technique. Following insertion, dressing applied. Post procedure assessment: normal and unchanged  Patient tolerated the procedure well with no immediate complications.

## 2023-09-14 NOTE — Anesthesia Postprocedure Evaluation (Signed)
 Anesthesia Post Note  Patient: Blake Padilla  Procedure(s) Performed: EXCISION, MASS, MEDIASTINUM, ROBOT-ASSISTED (Right) BLOCK, NERVE, INTERCOSTAL (Chest)     Patient location during evaluation: PACU Anesthesia Type: General Level of consciousness: awake and alert Pain management: pain level controlled Vital Signs Assessment: post-procedure vital signs reviewed and stable Respiratory status: spontaneous breathing, nonlabored ventilation, respiratory function stable and patient connected to nasal cannula oxygen Cardiovascular status: blood pressure returned to baseline and stable Postop Assessment: no apparent nausea or vomiting Anesthetic complications: no   No notable events documented.  Last Vitals:  Vitals:   09/14/23 1115 09/14/23 1151  BP: 123/72 125/70  Pulse: 61 61  Resp: 14 15  Temp: 36.6 C 36.4 C  SpO2: 96% 97%    Last Pain:  Vitals:   09/14/23 1151  TempSrc: Oral  PainSc:                  Blake Padilla

## 2023-09-14 NOTE — H&P (Signed)
 PCP is [place] HERO, MD Referring Provider is Ruthell Lauraine FALCON, NP       Chief Complaint  Patient presents with   Mediastinal Lymphadenopathy      New patient consultation, PET 6/3, Chest CT 4/15      HPI: Blake Padilla is sent for consultation regarding mediastinal adenopathy.   Blake Padilla is a 73 year old man with a history of tobacco use (quit 2 years ago), COPD, coronary artery disease, hypertension, hyperlipidemia, iliac artery aneurysm, arthritis, and panic attacks.   He is followed in the low-dose CT for lung cancer screening program at [facility].  In April his scan showed a 1.7 cm lymph node in the right pericardial fat pad at the cardiophrenic angle.  There were multiple small lung nodules bilaterally that have been present previously and were unchanged.   He had a PET/CT which showed the node was hypermetabolic with an SUV of 5.6.  There was a smaller node in the anterior mediastinal fat pad with SUV of 2.7.   He has not had any fevers, chills, night sweats, chest pain, pressure, tightness, or shortness of breath.  Has been anxious about the findings.  Had food poisoning a couple weeks ago.  He has lost 15 pounds over the past 3 months.   Zubrod Score: At the time of surgery this patient's most appropriate activity status/level should be described as: [x]     0    Normal activity, no symptoms []     1    Restricted in physical strenuous activity but ambulatory, able to do out light work []     2    Ambulatory and capable of self care, unable to do work activities, up and about >50 % of waking hours                              []     3    Only limited self care, in bed greater than 50% of waking hours []     4    Completely disabled, no self care, confined to bed or chair []     5    Moribund       Past Medical History:  Diagnosis Date   Anxiety      in small places   Arthritis     COPD (chronic obstructive pulmonary disease) (HCC)     Coronary artery disease      Hypertension     Iliac aneurysm (HCC) 01/16/2021   Panic attacks                 Past Surgical History:  Procedure Laterality Date   ENDOVASCULAR REPAIR/STENT GRAFT N/A 01/16/2021    Procedure: ENDOVASCULAR REPAIR/STENT GRAFT;  Surgeon: Marea Selinda RAMAN, MD;  Location: [facility] INVASIVE CV LAB;  Service: Cardiovascular;  Laterality: N/A;   ILIAC ARTERY ANEURYSM REPAIR   01/16/2021   TOTAL HIP ARTHROPLASTY Left 06/11/2021    Procedure: TOTAL HIP ARTHROPLASTY ANTERIOR APPROACH;  Surgeon: Kathlynn Sharper, MD;  Location: [facility] ORS;  Service: Orthopedics;  Laterality: Left;               Family History  Problem Relation Age of Onset   Diabetes Mother     Hypertension Mother     Diabetes Father     Hypertension Father     Leukemia Sister     Diabetes Brother     Dementia Sister     COPD Sister  Healthy Sister            Social History Social History  Social History         Tobacco Use   Smoking status: Former      Current packs/day: 0.00      Types: Cigarettes      Quit date: 1971      Years since quitting: 54.4   Smokeless tobacco: Never   Tobacco comments:      Quit smoking 11/2021  Vaping Use   Vaping status: Never Used  Substance Use Topics   Alcohol use: Not Currently      Comment: with colds   Drug use: Never              Current Outpatient Medications  Medication Sig Dispense Refill   amLODipine  (NORVASC ) 5 MG tablet Take 1 tablet (5 mg total) by mouth daily. 30 tablet 3   aspirin  EC 81 MG EC tablet Take 1 tablet (81 mg total) by mouth daily. Swallow whole. 30 tablet 11   citalopram (CELEXA) 10 MG tablet Take by mouth.       ezetimibe  (ZETIA ) 10 MG tablet Take 1 tablet (10 mg total) by mouth daily. 90 tablet 3   NEXIUM 40 MG capsule Take 40 mg by mouth daily.       pravastatin  (PRAVACHOL ) 20 MG tablet Take 1 tablet (20 mg total) by mouth every evening. 1 tablet 3   betamethasone  dipropionate 0.05 % cream Apply topically 2 (two) times daily. Apply for 4-6  weeks, not to exceed 6 weeks. (Patient not taking: Reported on 03/31/2023) 45 g 0   cetirizine (ZYRTEC) 10 MG tablet Take 10 mg by mouth daily.          No current facility-administered medications for this visit.        Allergies  No Known Allergies     Review of Systems  Constitutional:  Positive for unexpected weight change. Negative for activity change, chills, diaphoresis and fever.  HENT:  Negative for trouble swallowing and voice change.   Respiratory:  Negative for shortness of breath.   Cardiovascular:  Negative for chest pain.  Musculoskeletal:  Positive for arthralgias.  Neurological:  Negative for seizures and weakness.  Psychiatric/Behavioral:  The patient is nervous/anxious.   All other systems reviewed and are negative.     BP (!) 153/63 (BP Location: Right Arm, Patient Position: Sitting, Cuff Size: Normal)   Pulse 70   Resp 20   Ht 6' 3 (1.905 m)   Wt 236 lb 6.4 oz (107.2 kg)   SpO2 93% Comment: RA  BMI 29.55 kg/m  Physical Exam Vitals reviewed.  Constitutional:      General: He is not in acute distress.    Appearance: Normal appearance.  HENT:     Head: Normocephalic and atraumatic.    Eyes:     General: No scleral icterus.    Extraocular Movements: Extraocular movements intact.      Cardiovascular:     Rate and Rhythm: Normal rate and regular rhythm.     Heart sounds: Normal heart sounds. No murmur heard.    No friction rub. No gallop.  Pulmonary:     Effort: Pulmonary effort is normal. No respiratory distress.     Breath sounds: Normal breath sounds. No wheezing or rales.  Abdominal:     General: There is no distension.     Palpations: Abdomen is soft.    Musculoskeletal:     Cervical back:  Neck supple.  Lymphadenopathy:     Cervical: No cervical adenopathy.    Skin:    General: Skin is warm and dry.    Neurological:     General: No focal deficit present.     Mental Status: He is alert and oriented to person, place, and time.      Cranial Nerves: No cranial nerve deficit.     Motor: No weakness.      Diagnostic Tests: NUCLEAR MEDICINE PET SKULL BASE TO THIGH   TECHNIQUE: 12.8 mCi F-18 FDG was injected intravenously. Full-ring PET imaging was performed from the skull base to thigh after the radiotracer. CT data was obtained and used for attenuation correction and anatomic localization.   Fasting blood glucose: 134 mg/dl   COMPARISON:  Chest CT on 06/09/2023   FINDINGS: Mediastinal blood-pool activity (background): SUV max = 1.9   Liver activity (reference): SUV max = N/A   NECK:  No hypermetabolic lymph nodes or masses.   Incidental CT findings:  None.   CHEST: 1.5 cm mediastinal lymph node in the right cardiophrenic angle shows hypermetabolism, with SUV max of 5.6. A 7 mm anterior mediastinal lymph node between the anterior heart border and sternum shows mild hypermetabolism, with SUV max of 2.7. No other sites of hypermetabolic lymphadenopathy identified.   Mild centrilobular emphysema no suspicious pulmonary nodules seen on CT images.   Incidental CT findings: Mild centrilobular emphysema. 5 mm pulmonary nodule in the inferior right upper lobe on image 68/6 remains stable, and shows no FDG uptake.   ABDOMEN/PELVIS: No abnormal hypermetabolic activity within the liver, pancreas, adrenal glands, or spleen. No hypermetabolic lymph nodes in the abdomen or pelvis.   Incidental CT findings: Aorto bi-iliac stent graft is seen, with native aneurysm of left common iliac artery measuring 3.3 cm. Mildly enlarged prostate.   SKELETON: No focal hypermetabolic bone lesions to suggest skeletal metastasis.   Incidental CT findings:  Left hip prosthesis noted.   IMPRESSION: Hypermetabolic mediastinal lymph nodes in the right cardiophrenic angle and retrosternal anterior mediastinum. Differential diagnosis includes lymphoproliferative disorder, inflammatory or infectious etiologies, and less likely  metastatic disease.   Stable 5 mm right upper lobe pulmonary nodule, without FDG uptake suggesting benign etiology. Recommend continued attention on follow-up imaging.     Electronically Signed   By: Norleen DELENA Kil M.D.   On: 08/03/2023 09:09   I personally reviewed the CT and PET/CT images.  1.7 cm node in the right pericardial fat pad with SUV of 5.6.  Smaller nodule anterior to the heart in the anterior mediastinal fat pad with SUV of 2.7.   Severe coronary calcification.  Multiple small stable lung nodules.   Impression: Blake Padilla is a 73 year old man with a history of tobacco use (quit 2 years ago), COPD, coronary artery disease, hypertension, hyperlipidemia, iliac artery aneurysm, arthritis, and panic attacks.   Mediastinal adenopathy-has a large hypermetabolic node in the right pericardial fat pad and a smaller less hypermetabolic node in the anterior mediastinal fat pad.  Neither of these are accessible for mediastinoscopy.  Best option for biopsy and removal is a robotic right VATS approach.   I described the proposed procedure to Mr. Sanes.  He understands the general nature of the procedure.  I informed him of the need for general anesthesia, the incisions to be used, the use of the surgical robot, use of the drain postoperatively, the expected hospital stay (overnight), and the overall recovery.  I informed him of the indications, risks, benefits,  and alternatives.  He understands the risks include, but not limited to death, MI, DVT, PE, bleeding, possible need for transfusion, infection, air leak, cardiac arrhythmias, as well as possibility of other unforeseeable complications.   He is willing to accept the risks and agrees to proceed.   He is having cataract surgery in about 2 weeks and has had a second cataract surgery on July 9.  He wants to wait until after those before having the surgery.   He does have coronary disease.  He is scheduled to see Dr. Darliss in July  although I do not see an appointment listed in epic currently.  We will need cardiac clearance prior to the procedure.   Plan: Robotic assisted right VATS for resection of right pericardial fat pad lymph node and anterior mediastinal lymph node   Elspeth JAYSON Millers, MD [facility] [phone]          Electronically signed by Millers Elspeth JAYSON, MD at 08/07/2023  4:39 PM In interval he had cataract surgeries. No change in exam  Elspeth C. Millers, MD [facility] [phone]

## 2023-09-14 NOTE — Interval H&P Note (Signed)
 History and Physical Interval Note:  09/14/2023 7:12 AM  Blake Padilla  has presented today for surgery, with the diagnosis of ADENOPATHY RIGHT PERICARDIAL FAT PAD ANTERIOR MEDIASTINAL ADENOPATHY.  The various methods of treatment have been discussed with the patient and family. After consideration of risks, benefits and other options for treatment, the patient has consented to  Procedure(s) with comments: EXCISION, MASS, MEDIASTINUM, ROBOT-ASSISTED (Right) - RIGHT ROBOTIC RESECTION OF PERICARDIAL FAT PAD AND MEDIASTINAL LYMPH NODES as a surgical intervention.  The patient's history has been reviewed, patient examined, no change in status, stable for surgery.  I have reviewed the patient's chart and labs.  Questions were answered to the patient's satisfaction.     Blake Padilla

## 2023-09-14 NOTE — Transfer of Care (Signed)
 Immediate Anesthesia Transfer of Care Note  Patient: Blake Padilla  Procedure(s) Performed: EXCISION, MASS, MEDIASTINUM, ROBOT-ASSISTED (Right) BLOCK, NERVE, INTERCOSTAL (Chest)  Patient Location: PACU  Anesthesia Type:General  Level of Consciousness: patient cooperative  Airway & Oxygen Therapy: Patient Spontanous Breathing and Patient connected to nasal cannula oxygen  Post-op Assessment: Report given to RN and Post -op Vital signs reviewed and stable  Post vital signs: Reviewed and stable  Last Vitals:  Vitals Value Taken Time  BP 135/75   Temp 97.9   Pulse 67   Resp 12   SpO2 95     Last Pain:  Vitals:   09/14/23 0619  TempSrc: Oral         Complications: No notable events documented.

## 2023-09-14 NOTE — Plan of Care (Signed)
  Problem: Health Behavior/Discharge Planning: Goal: Ability to manage health-related needs will improve Outcome: Progressing   Problem: Clinical Measurements: Goal: Ability to maintain clinical measurements within normal limits will improve Outcome: Progressing Goal: Diagnostic test results will improve Outcome: Progressing Goal: Respiratory complications will improve Outcome: Progressing Goal: Cardiovascular complication will be avoided Outcome: Progressing   Problem: Activity: Goal: Risk for activity intolerance will decrease Outcome: Progressing   Problem: Nutrition: Goal: Adequate nutrition will be maintained Outcome: Progressing   Problem: Coping: Goal: Level of anxiety will decrease Outcome: Progressing   Problem: Elimination: Goal: Will not experience complications related to bowel motility Outcome: Progressing

## 2023-09-14 NOTE — Brief Op Note (Signed)
 09/14/2023  9:36 AM  PATIENT:  Blake Padilla  73 y.o. male  PRE-OPERATIVE DIAGNOSIS:  ADENOPATHY RIGHT PERICARDIAL FAT PAD ANTERIOR MEDIASTINAL ADENOPATHY  POST-OPERATIVE DIAGNOSIS:  ADENOPATHY RIGHT PERICARDIAL FAT PADANTERIOR MEDIASTINAL ADENOPATHY  PROCEDURE:  Procedure(s) with comments: EXCISION, MASS, MEDIASTINUM, ROBOT-ASSISTED (Right) - RIGHT ROBOTIC RESECTION OF PERICARDIAL FAT PAD AND MEDIASTINAL LYMPH NODES BLOCK, NERVE, INTERCOSTAL (N/A)  SURGEON:  Surgeons and Role:    * Kerrin Elspeth JAYSON, MD - Primary  PHYSICIAN ASSISTANT: Theresa Wedel PA-C  ASSISTANTS: none   ANESTHESIA:   local and general  EBL:  30 mL   BLOOD ADMINISTERED:none  DRAINS: (19 F) Blake drain(s) in the MEDIASTINUM   LOCAL MEDICATIONS USED:  BUPIVICAINE  and OTHER EXPAREL   SPECIMEN:  Source of Specimen:  MEDIASTINAL FAT PAD/LYMPH NODES  DISPOSITION OF SPECIMEN:  PATHOLOGY  COUNTS:  YES  TOURNIQUET:  * No tourniquets in log *  DICTATION: .Other Dictation: Dictation Number [id number]  PLAN OF CARE: Admit for overnight observation  PATIENT DISPOSITION:  PACU - hemodynamically stable.   Delay start of Pharmacological VTE agent (>24hrs) due to surgical blood loss or risk of bleeding: NO  COMPLICATIONS: NO KNOWN

## 2023-09-14 NOTE — Anesthesia Preprocedure Evaluation (Addendum)
 Anesthesia Evaluation  Patient identified by MRN, date of birth, ID band Patient awake    Reviewed: Allergy & Precautions, NPO status , Patient's Chart, lab work & pertinent test results  Airway Mallampati: II  TM Distance: >3 FB Neck ROM: Full    Dental no notable dental hx. (+) Edentulous Upper, Edentulous Lower   Pulmonary COPD, Patient abstained from smoking., former smoker   Pulmonary exam normal        Cardiovascular hypertension, Pt. on medications + CAD and + Peripheral Vascular Disease   Rhythm:Regular Rate:Normal     Neuro/Psych   Anxiety     negative neurological ROS     GI/Hepatic Neg liver ROS,GERD  ,,  Endo/Other  negative endocrine ROS    Renal/GU negative Renal ROS  negative genitourinary   Musculoskeletal  (+) Arthritis , Osteoarthritis,    Abdominal Normal abdominal exam  (+)   Peds  Hematology Lab Results      Component                Value               Date                      WBC                      12.5 (H)            09/10/2023                HGB                      12.4 (L)            09/10/2023                HCT                      39.0                09/10/2023                MCV                      90.7                09/10/2023                PLT                      147 (L)             09/10/2023             Lab Results      Component                Value               Date                      NA                       140                 09/10/2023                K  4.2                 09/10/2023                CO2                      26                  09/10/2023                GLUCOSE                  116 (H)             09/10/2023                BUN                      16                  09/10/2023                CREATININE               1.18                09/10/2023                CALCIUM                   9.0                 09/10/2023                 GFRNONAA                 >60                 09/10/2023              Anesthesia Other Findings   Reproductive/Obstetrics                              Anesthesia Physical Anesthesia Plan  ASA: 3  Anesthesia Plan: General   Post-op Pain Management:    Induction: Intravenous  PONV Risk Score and Plan: 2 and Ondansetron , Dexamethasone  and Treatment may vary due to age or medical condition  Airway Management Planned: Mask and Double Lumen EBT  Additional Equipment: Arterial line  Intra-op Plan:   Post-operative Plan: Extubation in OR  Informed Consent: I have reviewed the patients History and Physical, chart, labs and discussed the procedure including the risks, benefits and alternatives for the proposed anesthesia with the patient or authorized representative who has indicated his/her understanding and acceptance.     Dental advisory given  Plan Discussed with: CRNA  Anesthesia Plan Comments: (-vivisight, 2PIV, art line down arm)         Anesthesia Quick Evaluation

## 2023-09-14 NOTE — Anesthesia Procedure Notes (Signed)
 Procedure Name: Intubation Date/Time: 09/14/2023 7:58 AM  Performed by: Emmitt Millman, CRNAPre-anesthesia Checklist: Patient identified, Emergency Drugs available, Suction available and Patient being monitored Patient Re-evaluated:Patient Re-evaluated prior to induction Oxygen Delivery Method: Circle system utilized Preoxygenation: Pre-oxygenation with 100% oxygen Induction Type: IV induction Ventilation: Mask ventilation without difficulty and Oral airway inserted - appropriate to patient size Laryngoscope Size: Mac and 4 Endobronchial tube: Left and Double lumen EBT and 41 Fr Number of attempts: 1 Airway Equipment and Method: Stylet and Oral airway Placement Confirmation: ETT inserted through vocal cords under direct vision, positive ETCO2 and breath sounds checked- equal and bilateral Tube secured with: Tape Dental Injury: Teeth and Oropharynx as per pre-operative assessment  Comments: Vivasight

## 2023-09-15 ENCOUNTER — Encounter (HOSPITAL_COMMUNITY): Payer: Self-pay | Admitting: Thoracic Surgery (Cardiothoracic Vascular Surgery)

## 2023-09-15 ENCOUNTER — Inpatient Hospital Stay (HOSPITAL_COMMUNITY)

## 2023-09-15 DIAGNOSIS — Z9889 Other specified postprocedural states: Secondary | ICD-10-CM

## 2023-09-15 LAB — CBC
HCT: 34.7 % — ABNORMAL LOW (ref 39.0–52.0)
Hemoglobin: 11.1 g/dL — ABNORMAL LOW (ref 13.0–17.0)
MCH: 28.9 pg (ref 26.0–34.0)
MCHC: 32 g/dL (ref 30.0–36.0)
MCV: 90.4 fL (ref 80.0–100.0)
Platelets: 130 K/uL — ABNORMAL LOW (ref 150–400)
RBC: 3.84 MIL/uL — ABNORMAL LOW (ref 4.22–5.81)
RDW: 14.1 % (ref 11.5–15.5)
WBC: 16.5 K/uL — ABNORMAL HIGH (ref 4.0–10.5)
nRBC: 0 % (ref 0.0–0.2)

## 2023-09-15 LAB — BASIC METABOLIC PANEL WITH GFR
Anion gap: 6 (ref 5–15)
BUN: 19 mg/dL (ref 8–23)
CO2: 22 mmol/L (ref 22–32)
Calcium: 8.4 mg/dL — ABNORMAL LOW (ref 8.9–10.3)
Chloride: 109 mmol/L (ref 98–111)
Creatinine, Ser: 1.35 mg/dL — ABNORMAL HIGH (ref 0.61–1.24)
GFR, Estimated: 55 mL/min — ABNORMAL LOW (ref >60)
Glucose, Bld: 138 mg/dL — ABNORMAL HIGH (ref 70–99)
Potassium: 4.5 mmol/L (ref 3.5–5.1)
Sodium: 137 mmol/L (ref 135–145)

## 2023-09-15 MED ORDER — PRAVASTATIN SODIUM 20 MG PO TABS: 20.0000 mg | ORAL_TABLET | Freq: Every day | ORAL | Status: AC

## 2023-09-15 MED ORDER — TRAMADOL HCL 50 MG PO TABS: 50.0000 mg | ORAL_TABLET | Freq: Four times a day (QID) | ORAL | 0 refills | Status: AC | PRN

## 2023-09-15 NOTE — TOC Transition Note (Signed)
 Transition of Care [facility]) - Discharge Note   Patient Details  Name: [name] MRN: [id number] Date of Birth: 04-07-1950  [facility] CM/SW Contact:  Roxie KANDICE Stain, RN Phone Number: 09/15/2023, 9:02 AM   Clinical Narrative:    Blake Padilla is stable to discharge home. Patient to follow up with Dr. Kerrin.  No TOC needs at this time.   Final next level of care: Home/Self Care Barriers to Discharge: Barriers Resolved   Patient Goals and CMS Choice Patient states their goals for this hospitalization and ongoing recovery are:: return home          Discharge Placement               home        Discharge Plan and Services Additional resources added to the After Visit Summary for                                       Social Drivers of Health (SDOH) Interventions SDOH Screenings   Food Insecurity: No Food Insecurity (09/14/2023)  Housing: Low Risk  (09/14/2023)  Transportation Needs: No Transportation Needs (09/14/2023)  Utilities: Not At Risk (09/14/2023)  Social Connections: Socially Integrated (09/14/2023)  Tobacco Use: Medium Risk (09/14/2023)     Readmission Risk Interventions    09/15/2023    9:02 AM  Readmission Risk Prevention Plan  Post Dischage Appt Complete  Medication Screening Complete  Transportation Screening Complete

## 2023-09-15 NOTE — Plan of Care (Signed)
  Problem: Education: Goal: Knowledge of General Education information will improve Description: Including pain rating scale, medication(s)/side effects and non-pharmacologic comfort measures Outcome: Progressing   Problem: Health Behavior/Discharge Planning: Goal: Ability to manage health-related needs will improve Outcome: Progressing   Problem: Clinical Measurements: Goal: Ability to maintain clinical measurements within normal limits will improve Outcome: Progressing Goal: Will remain free from infection Outcome: Progressing Goal: Diagnostic test results will improve Outcome: Progressing Goal: Respiratory complications will improve Outcome: Progressing Goal: Cardiovascular complication will be avoided Outcome: Progressing   Problem: Activity: Goal: Risk for activity intolerance will decrease Outcome: Progressing   Problem: Nutrition: Goal: Adequate nutrition will be maintained Outcome: Progressing   Problem: Coping: Goal: Level of anxiety will decrease Outcome: Progressing   Problem: Elimination: Goal: Will not experience complications related to bowel motility Outcome: Progressing Goal: Will not experience complications related to urinary retention Outcome: Progressing   Problem: Pain Managment: Goal: General experience of comfort will improve and/or be controlled Outcome: Progressing   Problem: Safety: Goal: Ability to remain free from injury will improve Outcome: Progressing   Problem: Skin Integrity: Goal: Risk for impaired skin integrity will decrease Outcome: Progressing   Problem: Education: Goal: Knowledge of disease or condition will improve Outcome: Progressing Goal: Knowledge of the prescribed therapeutic regimen will improve Outcome: Progressing   Problem: Activity: Goal: Risk for activity intolerance will decrease Outcome: Progressing   Problem: Cardiac: Goal: Will achieve and/or maintain hemodynamic stability Outcome: Progressing    Problem: Clinical Measurements: Goal: Postoperative complications will be avoided or minimized Outcome: Progressing   Problem: Respiratory: Goal: Respiratory status will improve Outcome: Progressing   Problem: Pain Management: Goal: Pain level will decrease Outcome: Progressing

## 2023-09-15 NOTE — Discharge Summary (Signed)
 Physician Discharge Summary                [address]               Th[place]OCK[place], [place] [address]                      [phone]   Patient ID: Blake Padilla MRN: [id number] DOB/AGE: [birth year] 73 y.o.  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   POSTOPERATIVE DIAGNOSIS:  Mass, right pericardial fat pad and anterior mediastinal adenopathy.   PROCEDURE:  Robotic-assisted right VATS resection of pericardial fat pad and anterior mediastinal lymph nodes.   SURGEON:  Blake BROCKS. Kerrin, MD.   ASSISTANT:  Blake Cera, PA-C   PCP is Blake Rock HERO, MD Referring Provider is Blake Lauraine FALCON, NP   HPI: [name] is sent for consultation regarding mediastinal adenopathy.   Blake Padilla is a 73 year old man with a history of tobacco use (quit 2 years ago), COPD, coronary artery disease, hypertension, hyperlipidemia, iliac artery aneurysm, arthritis, and panic attacks.   He is followed in the low-dose CT for lung cancer screening program at [facility].  In April his scan showed a 1.7 cm lymph node in the right pericardial fat pad at the cardiophrenic angle.  There were multiple small lung nodules bilaterally that have  been present previously and were unchanged.   He had a PET/CT which showed the node was hypermetabolic with an SUV of 5.6.  There was a smaller node in the anterior mediastinal fat pad with SUV of 2.7.   He has not had any fevers, chills, night sweats, chest pain, pressure, tightness, or shortness of breath.  Has been anxious about the findings.  Had food poisoning a couple weeks ago.  He has lost 15 pounds over the past 3 months.  Following full review of the patient and all relevant studies Dr. Kerrin recommended admission for elective robotic assisted thoracoscopic surgical resection of the mediastinal lymph nodes/fat pad.  Hospital course: The patient was admitted electively on 09/14/2023 at which time he was taken the operating room and underwent a right robotic assisted thoracoscopic resection of lymph nodes and mediastinal fat pad.  Initial pathology of frozen was consistent with possible lymphoma.  He tolerated the procedure well and was taken to the postanesthesia care unit in stable condition.  Postoperative hospital course:  Patient has done well.  He is maintained stable vital signs and is afebrile.  Incisions are healing with no evidence of infection.  His creatinine did bump slightly on postop day 1 to 1.35.  He has a mild acute blood loss anemia on chronic anemia.  Final pathology is pending.  This will be discussed with the patient in the office next week.  He is ambulating without difficulty.  His pain is under good control.  He tolerated routine pulmonary hygiene measures.  Chest tubes was removed on postoperative day #1.  Stay was quite stable in appearance.  Overall, at the time of discharge the patient was felt to be quite stable.    Latest Vital Signs: Blood pressure 136/66, pulse 69, temperature 98.3 F (36.8 C), temperature source Oral, resp. rate 20, height 6' 3 (1.905 m), weight 105.2 kg, SpO2 98%.  Physical Exam:  General appearance: alert, cooperative, and no  distress Heart: regular rate and rhythm Lungs: clear to auscultation bilaterally Abdomen: benign Extremities: no edema or calf tenderness Wound: dressings clean, dry, intact   Discharge Condition:good  Recent laboratory studies:  Lab Results  Component Value Date   WBC 16.5 (H) 09/15/2023   HGB 11.1 (L) 09/15/2023   HCT 34.7 (L) 09/15/2023   MCV 90.4 09/15/2023   PLT 130 (L) 09/15/2023   Lab Results  Component Value Date   NA 137 09/15/2023   K 4.5 09/15/2023   CL 109 09/15/2023   CO2 22 09/15/2023   CREATININE 1.35 (H) 09/15/2023   GLUCOSE 138 (H) 09/15/2023      Diagnostic Studies: DG Chest Port 1 View Result Date: 09/15/2023 CLINICAL DATA:  Post mediastinal mass EXAM: PORTABLE CHEST 1 VIEW COMPARISON:  Radiograph 09/14/2023 FINDINGS: Normal cardiac silhouette. RIGHT chest tube in place. No pneumothorax. No pulmonary edema. No pleural fluid. IMPRESSION: RIGHT chest tube in place. No pneumothorax. Electronically Signed   By: Jackquline Boxer M.D.   On: 09/15/2023 09:46   DG Chest Port 1 View Result Date: 09/14/2023 CLINICAL DATA:  Mediastinal mass status post excision EXAM: PORTABLE CHEST 1 VIEW COMPARISON:  09/14/2018 FINDINGS: Low lung volumes are present, causing crowding of the pulmonary vasculature. Atherosclerotic calcification of the aortic arch. Mild enlargement of the cardiopericardial silhouette, new from the preoperative exam although the low lung volumes may be contributory to this appearance. Indistinct pulmonary vasculature, pulmonary venous hypertension not excluded. No overt edema or blunting of the costophrenic angles. No significant pneumothorax. If there is a mediastinal drain and is not very radiographically appreciable. Some tubing projects over the right chest. IMPRESSION: 1. Mild enlargement of the cardiopericardial silhouette, new from the preoperative exam although the low lung volumes may be contributory to this appearance. 2. Indistinct pulmonary  vasculature, pulmonary venous hypertension not excluded. 3. Aortic Atherosclerosis (ICD10-I70.0). Electronically Signed   By: Ryan Salvage M.D.   On: 09/14/2023 11:27   DG Chest 2 View Result Date: 09/14/2023 CLINICAL DATA:  Preoperative evaluation for mediastinal lymphadenopathy EXAM: CHEST - 2 VIEW COMPARISON:  CT chest dated 06/09/2023 FINDINGS: Normal lung volumes. No focal consolidations. No pleural effusion or pneumothorax. The heart size and mediastinal contours are within normal limits. No acute osseous abnormality. IMPRESSION: No active cardiopulmonary disease. Electronically Signed   By: Limin  Xu M.D.   On: 09/14/2023 09:08    Final path:pending     Discharge Medications: Allergies as of 09/15/2023   No Known Allergies      Medication List     STOP taking these medications    ketorolac  0.5 % ophthalmic solution Commonly known as: ACULAR    prednisoLONE  acetate 1 % ophthalmic suspension Commonly known as: PRED FORTE        TAKE these medications    amLODipine  5 MG tablet Commonly known as: NORVASC  Take 1 tablet (5 mg total) by mouth daily.   aspirin  EC 81 MG tablet Take 1 tablet (81  mg total) by mouth daily. Swallow whole.   cetirizine 10 MG tablet Commonly known as: ZYRTEC Take 10 mg by mouth daily.   ezetimibe  10 MG tablet Commonly known as: ZETIA  Take 1 tablet (10 mg total) by mouth daily.   NexIUM 40 MG capsule Generic drug: esomeprazole Take 40 mg by mouth daily as needed (indigestion / heart burn).   pravastatin  20 MG tablet Commonly known as: PRAVACHOL  Take 1 tablet (20 mg total) by mouth daily.   traMADol  50 MG tablet Commonly known as: ULTRAM  Take 1 tablet (50 mg total) by mouth every 6 (six) hours as needed for up to 7 days (mild pain).        Follow Up Appointments:  Follow-up Information     Padilla Blake BROCKS, MD Follow up.   Specialty: Cardiothoracic Surgery Why: You will have  follow-up appointment with Dr.  Kerrin.They will call you with time/date.   On the date of this visit you will  Obtain a chest x-ray 1 hour prior to the appointment with Dr. Charlott.  This x-ray will be done in the same building Contact information: [address] [place] [address] [phone]                 Signed: Lemond FORBES Salon 09/15/2023, 12:31 PM

## 2023-09-15 NOTE — Op Note (Signed)
 NAME: Blake Padilla, Blake Padilla MEDICAL RECORD NO: [id number] ACCOUNT NO: [id number] DATE OF BIRTH: 1950-06-17 FACILITY: [facility] LOCATION: [facility] PHYSICIAN: Elspeth BROCKS. Kerrin, MD  Operative Report   DATE OF PROCEDURE: 09/14/2023  PREOPERATIVE DIAGNOSIS:  Mass, right pericardial fat pad and anterior mediastinal adenopathy.  POSTOPERATIVE DIAGNOSIS:  Mass, right pericardial fat pad and anterior mediastinal adenopathy.  PROCEDURE:  Robotic-assisted right VATS resection of pericardial fat pad and anterior mediastinal lymph nodes.  SURGEON:  Elspeth BROCKS. Kerrin, MD.  ASSISTANT:  Lemond Cera, PA.  ANESTHESIA:  General.  FINDINGS:  Mass in fat pad appearance consistent with an enlarged lymph node.  Lymphoid cells seen on frozen section.  Diagnosis deferred to permanent and flow cytometry.  CLINICAL NOTE:  Blake Padilla is a 73 year old man who had a low-dose CT for lung cancer screening.  His scan showed a 1.7 cm lymph node in the right pericardial fat pad at the cardiophrenic angle.  There also were small hypermetabolic lymph nodes in the anterior mediastinal fat pad.  He was advised to undergo robotic-assisted VATS for biopsy of the lymph nodes.  The indications, risks, benefits, and alternatives were discussed in detail with the patient.  He understood this was diagnostic.  He accepted the risks and agreed to proceed.  OPERATIVE NOTE:  Blake Padilla was brought to the operating room on 09/14/2023.  He was intubated with a double lumen endotracheal tube.  Intravenous antibiotics were administered.  A Foley catheter was placed.  Sequential compression devices were placed on the calves for DVT prophylaxis.  He was left in a supine position with the right chest elevated slightly.  A Bair Hugger was placed for active warming.  The right chest was prepped and draped in the usual sterile fashion.  A timeout was performed.  Single lung ventilation of the left lung was initiated and was tolerated well throughout  the procedure.  A solution containing 20 mL of liposomal bupivacaine  and 30 mL of 0.5% bupivacaine  was used for local at the incision sites.  Incision was made in the fifth interspace in the anterior axillary line.  The chest was entered bluntly using a hemostat.  An 8-mm robotic port was inserted.  The thoracoscope was advanced into the chest.  After confirming intrapleural placement, carbon dioxide was insufflated per protocol.  Two additional 8-mm robotic ports were placed, one in the third interspace and one in the eighth interspace, and then a 12-mm AirSeal port was placed in the seventh interspace just posterior to the robotic  ports.  The robot was deployed.  The camera arm was docked.  Targeting was performed.  The remaining arms were docked.  The robotic instruments were inserted with arthroscopic visualization.  The nodule in the pericardial fat pad was easily identified and the pericardial fat pad was resected including this nodule.  It was placed into an endoscopic retrieval bag and removed.  On the back table, this appeared to be an enlarged lymph node.  A small portion was sent for frozen section and the remainder was sent for permanent pathology and flow cytometry.  The frozen section returned showing lymphoid cells.  There was no metastatic carcinoma.  Inspection of the anterior mediastinal fat pad revealed a relatively large but more benign-appearing node anterior to the phrenic nerve.  This node was removed taking care not to use cautery in the vicinity of the phrenic nerve.  There were several small nodes visible in the anterior mediastinal fat  pad, any one of which could have been  the hypermetabolic node on scans.  A portion of the fat pad was removed encompassing several lymph nodes and these were all sent for permanent pathology.  There was good hemostasis.  The robotic instruments were withdrawn and the robot was undocked.  A 19-French Blake drain was placed through the eighth interspace  incision and secured with a #1 silk suture.  Dual lung ventilation was resumed.  The remaining incisions were closed in standard fashion.  The patient  was extubated in the operating room and taken to the post-anesthesia care unit in good condition.  All sponge, needle, and instrument counts were correct at the end of the procedure.  Experienced assistance was necessary for this case due to surgical complexity. Lemond Cera assisted with robot docking and undocking, instrument exchange, specimen retrieval, and wound closure.    MUK D: 09/14/2023 6:17:08 pm T: 09/15/2023 1:11:00 am  JOB: [id number]/ [id number]

## 2023-09-15 NOTE — Progress Notes (Addendum)
 1 Day Post-Op Procedure(s) (LRB): EXCISION, MASS, MEDIASTINUM, ROBOT-ASSISTED (Right) BLOCK, NERVE, INTERCOSTAL (N/A) Subjective: Very anxious overnight per nursing- feels calm now  Objective: Vital signs in last 24 hours: Temp:  [97.6 F (36.4 C)-98.3 F (36.8 C)] 98.3 F (36.8 C) (07/22 0550) Pulse Rate:  [59-86] 69 (07/22 0550) Cardiac Rhythm: Normal sinus rhythm (07/21 1903) Resp:  [14-23] 15 (07/21 1151) BP: (112-126)/(57-72) 123/69 (07/22 0550) SpO2:  [95 %-99 %] 98 % (07/21 1923) Arterial Line BP: (125-162)/(50-73) 125/51 (07/21 1100)  Hemodynamic parameters for last 24 hours:    Intake/Output from previous day: 07/21 0701 - 07/22 0700 In: 3204.2 [P.O.:860; I.V.:2044.2; IV Piggyback:300] Out: 715 [Urine:625; Blood:30; Chest Tube:60] Intake/Output this shift: No intake/output data recorded.  General appearance: alert, cooperative, and no distress Heart: regular rate and rhythm Lungs: clear to auscultation bilaterally Abdomen: benign Extremities: no edema or calf tenderness Wound: dressings clean, dry, intact  Lab Results: Recent Labs    09/15/23 0300  WBC 16.5*  HGB 11.1*  HCT 34.7*  PLT 130*   BMET:  Recent Labs    09/15/23 0300  NA 137  K 4.5  CL 109  CO2 22  GLUCOSE 138*  BUN 19  CREATININE 1.35*  CALCIUM  8.4*    PT/INR: No results for input(s): LABPROT, INR in the last 72 hours. ABG No results found for: PHART, HCO3, TCO2, ACIDBASEDEF, O2SAT CBG (last 3)  No results for input(s): GLUCAP in the last 72 hours.  Meds Scheduled Meds:  acetaminophen   1,000 mg Oral Q6H   Or   acetaminophen  (TYLENOL ) oral liquid 160 mg/5 mL  1,000 mg Oral Q6H   amLODipine   5 mg Oral Daily   aspirin  EC  81 mg Oral Daily   bisacodyl   10 mg Oral Daily   enoxaparin  (LOVENOX ) injection  40 mg Subcutaneous QHS   ezetimibe   10 mg Oral Daily   ketorolac   1 drop Left Eye QID   loratadine   10 mg Oral Daily   pantoprazole   40 mg Oral Daily    pravastatin   20 mg Oral Daily   prednisoLONE  acetate  1 drop Left Eye QID   senna-docusate  1 tablet Oral QHS   Continuous Infusions:  dextrose  5 % and 0.9 % NaCl 75 mL/hr at 09/15/23 0229   PRN Meds:.morphine  injection, ondansetron  (ZOFRAN ) IV, oxyCODONE , traMADol   Xrays DG Chest Port 1 View Result Date: 09/14/2023 CLINICAL DATA:  Mediastinal mass status post excision EXAM: PORTABLE CHEST 1 VIEW COMPARISON:  09/14/2018 FINDINGS: Low lung volumes are present, causing crowding of the pulmonary vasculature. Atherosclerotic calcification of the aortic arch. Mild enlargement of the cardiopericardial silhouette, new from the preoperative exam although the low lung volumes may be contributory to this appearance. Indistinct pulmonary vasculature, pulmonary venous hypertension not excluded. No overt edema or blunting of the costophrenic angles. No significant pneumothorax. If there is a mediastinal drain and is not very radiographically appreciable. Some tubing projects over the right chest. IMPRESSION: 1. Mild enlargement of the cardiopericardial silhouette, new from the preoperative exam although the low lung volumes may be contributory to this appearance. 2. Indistinct pulmonary vasculature, pulmonary venous hypertension not excluded. 3. Aortic Atherosclerosis (ICD10-I70.0). Electronically Signed   By: Ryan Salvage M.D.   On: 09/14/2023 11:27   DG Chest 2 View Result Date: 09/14/2023 CLINICAL DATA:  Preoperative evaluation for mediastinal lymphadenopathy EXAM: CHEST - 2 VIEW COMPARISON:  CT chest dated 06/09/2023 FINDINGS: Normal lung volumes. No focal consolidations. No pleural effusion or pneumothorax. The heart  size and mediastinal contours are within normal limits. No acute osseous abnormality. IMPRESSION: No active cardiopulmonary disease. Electronically Signed   By: Limin  Xu M.D.   On: 09/14/2023 09:08    Assessment/Plan: S/P Procedure(s) (LRB): EXCISION, MASS, MEDIASTINUM, ROBOT-ASSISTED  (Right) BLOCK, NERVE, INTERCOSTAL (N/A) POD#1  1 afeb, VSS, sinus rhythm 2 sats ok on RA 3 CT 60 ml- no air leak 4 voiding, not all measured 5 CXR- no significant effus or ASD, no pntx 6 creat bumped a little 1.35, normal BUN 7 some increase in leukocytosis, WBC 16.5, was 12.5 on 09/10/23, likely reactive 8 minor expected ABLA on some degree of chronic anemia 9 thrombocytopenia, PLT 130K, not significantly different from baseline, likely reactive componenet- is on lovenox  for DVT ppx 10 possible lymphoma  awaiting flow cytometry/path 11 will d/c chest tube this am , d/c IVF- get up and around a bit- if no issues will d/c likely later today   LOS: 1 day    Lemond FORBES Cera PA-C Pager [phone] 09/15/2023 Patient seen and examined, agree with above Will see in office early next week to discuss path results  Elspeth C. Kerrin, MD [facility] [phone]

## 2023-09-16 LAB — SURGICAL PATHOLOGY

## 2023-09-17 ENCOUNTER — Other Ambulatory Visit: Payer: Self-pay | Admitting: Thoracic Surgery (Cardiothoracic Vascular Surgery)

## 2023-09-17 DIAGNOSIS — R590 Localized enlarged lymph nodes: Secondary | ICD-10-CM

## 2023-09-22 ENCOUNTER — Ambulatory Visit
Payer: Self-pay | Attending: Thoracic Surgery (Cardiothoracic Vascular Surgery) | Admitting: Thoracic Surgery (Cardiothoracic Vascular Surgery)

## 2023-09-22 ENCOUNTER — Encounter: Payer: Self-pay | Admitting: Thoracic Surgery (Cardiothoracic Vascular Surgery)

## 2023-09-22 ENCOUNTER — Ambulatory Visit (HOSPITAL_COMMUNITY)
Admission: RE | Admit: 2023-09-22 | Discharge: 2023-09-22 | Disposition: A | Discharge status: Home / Self Care | Source: Ambulatory Visit | Attending: Cardiology

## 2023-09-22 VITALS — BP 140/63 | HR 75 | Resp 20 | Ht 75.0 in | Wt 230.0 lb

## 2023-09-22 DIAGNOSIS — R590 Localized enlarged lymph nodes: Secondary | ICD-10-CM | POA: Insufficient documentation

## 2023-09-22 DIAGNOSIS — Z9889 Other specified postprocedural states: Secondary | ICD-10-CM

## 2023-09-22 DIAGNOSIS — J9859 Other diseases of mediastinum, not elsewhere classified: Secondary | ICD-10-CM

## 2023-09-22 NOTE — Progress Notes (Signed)
 [address]       Blake Padilla [address]             [phone]      HPI: Blake Padilla returns for a scheduled follow-up visit after recent mediastinal biopsy.  Blake Padilla is a 73 year old male with a history of tobacco use, COPD, CAD, hypertension, hyperlipidemia, iliac artery aneurysm, arthritis, and panic attacks.  Was found to have a 1.7 cm lymph node in the right pericardial fat pad on a low-dose CT for lung cancer screening.  PET/CT showed the node was hypermetabolic.  There also was some hypermetabolic activity in the anterior mediastinal fat pad.  I did a robotic assisted right VATS for biopsy.  He went home on postop day 1.  Final pathology is pending.  Flow cytometry showed a CD5 positive, lambda restricted lymphoproliferative process.  He feels well.  Notices some itching and a rash around his chest tube site.  No shortness of breath.  Pain is well-controlled.  Anxious to resume normal activities.  Past Medical History:  Diagnosis Date   Anxiety    in small places   Arthritis    COPD (chronic obstructive pulmonary disease) (HCC)    Pt denies   Coronary artery disease    GERD (gastroesophageal reflux disease)    Hypertension    Iliac aneurysm (HCC) 01/16/2021   Panic attacks    Peripheral vascular disease (HCC)    Iliac Artery Aneurysm Repair     Current Outpatient Medications  Medication Sig Dispense Refill   amLODipine  (NORVASC ) 5 MG tablet Take 1 tablet (5 mg total) by mouth daily. 30 tablet 3   aspirin  EC 81 MG EC tablet Take 1 tablet (81 mg total) by mouth daily. Swallow whole. 30 tablet 11   cetirizine (ZYRTEC) 10 MG tablet Take 10 mg by mouth daily.     ezetimibe  (ZETIA ) 10 MG tablet Take 1 tablet (10 mg total) by mouth daily. 90 tablet 3   NEXIUM 40 MG capsule Take 40 mg by mouth daily as needed (indigestion / heart burn).     pravastatin  (PRAVACHOL ) 20 MG tablet Take 1 tablet (20 mg total) by mouth daily.     traMADol  (ULTRAM ) 50 MG  tablet Take 1 tablet (50 mg total) by mouth every 6 (six) hours as needed for up to 7 days (mild pain). 28 tablet 0   No current facility-administered medications for this visit.    Physical Exam BP (!) 140/63   Pulse 75   Resp 20   Ht 6' 3 (1.905 m)   Wt 230 lb (104.3 kg)   SpO2 97%   BMI 28.75 kg/m  Well-appearing 73 year old man in no acute distress Alert and oriented x 3 with no focal deficits Lungs clear bilaterally Incisions healing well with mild rash at chest tube site  Diagnostic Tests: I personally reviewed the chest x-ray images.  No effusions or infiltrates.  Impression: Blake Padilla is a 73 year old former smoker with mediastinal adenopathy noted on a low-dose CT for lung cancer screening.  Hypermetabolic on PET.  Underwent a robotic assisted biopsy.  Flow cytometry shows a CD5 positive, lambda restricted lymphoproliferative disorder.  Will need referral to hematology oncology.  He is from [place] so we will refer to [place].  Doing well from a surgical standpoint.  Advised him to avoid any heavy lifting for least couple more weeks.  Can gradually resume activities as tolerated.   Plan: Referral to heme-onc at [facility]  for treatment of his lymphoproliferative disorder  Blake JAYSON Millers, MD [facility] [phone]

## 2023-09-22 NOTE — Telephone Encounter (Signed)
 SABRA

## 2023-09-30 ENCOUNTER — Ambulatory Visit: Admitting: Thoracic Surgery (Cardiothoracic Vascular Surgery)

## 2023-10-06 ENCOUNTER — Inpatient Hospital Stay: Attending: Oncology | Admitting: Oncology

## 2023-10-06 ENCOUNTER — Inpatient Hospital Stay

## 2023-10-06 ENCOUNTER — Encounter: Payer: Self-pay | Admitting: Oncology

## 2023-10-06 VITALS — BP 141/77 | HR 70 | Temp 96.0°F | Resp 18 | Wt 230.2 lb

## 2023-10-06 DIAGNOSIS — C8310 Mantle cell lymphoma, unspecified site: Secondary | ICD-10-CM | POA: Insufficient documentation

## 2023-10-06 DIAGNOSIS — C8312 Mantle cell lymphoma, intrathoracic lymph nodes: Secondary | ICD-10-CM

## 2023-10-06 DIAGNOSIS — I700 Atherosclerosis of aorta: Secondary | ICD-10-CM | POA: Diagnosis not present

## 2023-10-06 DIAGNOSIS — J439 Emphysema, unspecified: Secondary | ICD-10-CM | POA: Insufficient documentation

## 2023-10-06 DIAGNOSIS — K219 Gastro-esophageal reflux disease without esophagitis: Secondary | ICD-10-CM | POA: Diagnosis not present

## 2023-10-06 DIAGNOSIS — Z87891 Personal history of nicotine dependence: Secondary | ICD-10-CM | POA: Insufficient documentation

## 2023-10-06 DIAGNOSIS — Z79899 Other long term (current) drug therapy: Secondary | ICD-10-CM | POA: Insufficient documentation

## 2023-10-06 DIAGNOSIS — D3502 Benign neoplasm of left adrenal gland: Secondary | ICD-10-CM | POA: Diagnosis not present

## 2023-10-06 DIAGNOSIS — D649 Anemia, unspecified: Secondary | ICD-10-CM | POA: Diagnosis not present

## 2023-10-06 DIAGNOSIS — Z825 Family history of asthma and other chronic lower respiratory diseases: Secondary | ICD-10-CM | POA: Insufficient documentation

## 2023-10-06 DIAGNOSIS — Z8249 Family history of ischemic heart disease and other diseases of the circulatory system: Secondary | ICD-10-CM | POA: Diagnosis not present

## 2023-10-06 DIAGNOSIS — I10 Essential (primary) hypertension: Secondary | ICD-10-CM | POA: Diagnosis not present

## 2023-10-06 DIAGNOSIS — Z833 Family history of diabetes mellitus: Secondary | ICD-10-CM | POA: Insufficient documentation

## 2023-10-06 DIAGNOSIS — Z7982 Long term (current) use of aspirin: Secondary | ICD-10-CM | POA: Insufficient documentation

## 2023-10-06 DIAGNOSIS — Z806 Family history of leukemia: Secondary | ICD-10-CM | POA: Diagnosis not present

## 2023-10-06 DIAGNOSIS — D696 Thrombocytopenia, unspecified: Secondary | ICD-10-CM | POA: Insufficient documentation

## 2023-10-06 DIAGNOSIS — I2510 Atherosclerotic heart disease of native coronary artery without angina pectoris: Secondary | ICD-10-CM | POA: Diagnosis not present

## 2023-10-06 LAB — CBC WITH DIFFERENTIAL/PLATELET
Abs Immature Granulocytes: 0.03 K/uL (ref 0.00–0.07)
Basophils Absolute: 0.1 K/uL (ref 0.0–0.1)
Basophils Relative: 0 %
Eosinophils Absolute: 0.2 K/uL (ref 0.0–0.5)
Eosinophils Relative: 1 %
HCT: 39 % (ref 39.0–52.0)
Hemoglobin: 12.7 g/dL — ABNORMAL LOW (ref 13.0–17.0)
Immature Granulocytes: 0 %
Lymphocytes Relative: 77 %
Lymphs Abs: 9.2 K/uL — ABNORMAL HIGH (ref 0.7–4.0)
MCH: 28.3 pg (ref 26.0–34.0)
MCHC: 32.6 g/dL (ref 30.0–36.0)
MCV: 87.1 fL (ref 80.0–100.0)
Monocytes Absolute: 0.7 K/uL (ref 0.1–1.0)
Monocytes Relative: 6 %
Neutro Abs: 1.9 K/uL (ref 1.7–7.7)
Neutrophils Relative %: 16 %
Platelets: 146 K/uL — ABNORMAL LOW (ref 150–400)
RBC: 4.48 MIL/uL (ref 4.22–5.81)
RDW: 13.4 % (ref 11.5–15.5)
Smear Review: NORMAL
WBC: 12.2 K/uL — ABNORMAL HIGH (ref 4.0–10.5)
nRBC: 0 % (ref 0.0–0.2)

## 2023-10-06 LAB — HEPATITIS PANEL, ACUTE
HCV Ab: NONREACTIVE
Hep A IgM: NONREACTIVE
Hep B C IgM: NONREACTIVE
Hepatitis B Surface Ag: NONREACTIVE

## 2023-10-06 LAB — HIV ANTIBODY (ROUTINE TESTING W REFLEX): HIV Screen 4th Generation wRfx: NONREACTIVE

## 2023-10-06 LAB — LACTATE DEHYDROGENASE: LDH: 171 U/L (ref 98–192)

## 2023-10-06 LAB — URIC ACID: Uric Acid, Serum: 6 mg/dL (ref 3.7–8.6)

## 2023-10-06 NOTE — Assessment & Plan Note (Addendum)
 PET scan and biopsy pathology results reviewed and discussed with patient. 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  Will further discuss with pathologist  and add on prognostic factors including IGHV, SOX11, TP53     Normal LDH. MIPI score is 6.5,  high risk. Negative hepatitis and HIV. TP53 negative.  I will hold off bone marrow biopsy given that he does not have stage I disease and marrow biopsy results will not change treatment plan.  He does not have significant GI symptoms except chronic acid reflux.  Discussed about the option of referring to GI for EGD and colonoscopy to rule out occult GI MCL.  Patient is hesitant and would like to defer at this point.  Patient has high MIPI score, low Ki67. Mantle cell lymphoma involving pericardia fat and mediastinal lymph nodes.  I recommend chemotherapy treatment with  acalabrutinib  plus rituximab. Rationale and side effects were reviewed with patient.  Will arrange chemotherapy class.

## 2023-10-06 NOTE — Assessment & Plan Note (Signed)
Hemoglobin has improved.  Monitor

## 2023-10-06 NOTE — Assessment & Plan Note (Signed)
 Mild.  Monitor.  Check B12 and folate.

## 2023-10-06 NOTE — Progress Notes (Signed)
 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  Will further discuss with pathologist  and add on prognostic factors including IGHV, SOX11, TP53     Normal LDH. MIPI score is 6.5,  high risk. Negative hepatitis and HIV. TP53 negative.  I will hold off bone marrow biopsy given that he does not have stage I disease and marrow biopsy results will not change treatment plan.  He does not have significant GI symptoms except chronic acid reflux.  Discussed about the option of referring to GI for EGD and colonoscopy to rule out occult GI MCL.  Patient is hesitant and would like to defer at this point.  Patient has high MIPI score, low Ki67. Mantle cell lymphoma involving pericardia fat and mediastinal lymph nodes.  I recommend chemotherapy treatment with  acalabrutinib  plus rituximab. Rationale and side effects were reviewed with patient.  Will arrange chemotherapy class.    Normocytic anemia Hemoglobin has improved.  Monitor.  Thrombocytopenia (HCC) Mild.  Monitor.  Check B12 and folate.  Orders Placed This Encounter  Procedures   CBC with Differential/Platelet    Standing Status:   Future    Number of Occurrences:   1    Expected Date:   10/06/2023    Expiration Date:   01/04/2024   Flow cytometry panel-leukemia/lymphoma work-up    Standing Status:   Future    Number of Occurrences:   1    Expected Date:   10/06/2023    Expiration Date:   01/04/2024   Lactate dehydrogenase    Standing Status:   Future    Number of Occurrences:   1    Expected Date:   10/06/2023    Expiration Date:   01/04/2024   HIV Antibody  (routine testing w rflx)    Standing Status:   Future    Number of Occurrences:   1    Expected Date:   10/06/2023    Expiration Date:   01/04/2024   Hepatitis panel, acute    Standing Status:   Future    Number of Occurrences:   1    Expected Date:   10/06/2023    Expiration Date:   01/04/2024   Uric acid    Standing Status:   Future    Number of Occurrences:   1    Expected Date:   10/06/2023    Expiration Date:   01/04/2024    All questions were answered. The patient knows to call the clinic with any problems, questions or concerns.  Zelphia Cap, MD, PhD [facility] Hematology Oncology 10/06/2023    HISTORY OF PRESENTING ILLNESS:  Blake Padilla 73 y.o. male presents to establish care for mantle cell lymphoma I have reviewed his chart and materials related to his cancer extensively and collaborated history with the patient. 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 Newly mildly enlarged 1.7 cm right pericardiophrenic node, indeterminate for metastatic disease. Suggest follow-up chest CT with IV contrast in 3 months. Alternatively, PET-CT could be considered at this time.  2. Lung-RADS 2, benign appearance or behavior. Continue annual screening with low-dose chest CT without contrast in 12 months. 3. Two-vessel coronary atherosclerosis. 4. Stable left adrenal adenoma, for which no follow-up imaging is recommended. 5. Aortic Atherosclerosis (ICD10-I70.0) and Emphysema (ICD10-J43.9).   08/03/2023 Imaging   PET scan showed Hypermetabolic mediastinal lymph nodes in the right cardiophrenic angle and retrosternal anterior mediastinum. Differential diagnosis includes lymphoproliferative disorder, inflammatory or infectious etiologies, and less likely metastatic disease.   Stable 5 mm right upper lobe pulmonary nodule, without FDG  uptake suggesting benign etiology. Recommend continued attention on follow-up imaging.   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   Pathology showed  A. PERICARDIAL FAT PAD NODULE, EXCISION:  -  Mantle cell lymphoma, see note.   B. PERICARDIAL FAT PAD NODULE, EXCISION:  -  Mantle cell lymphoma, see note.   C. LYMPH NODE, ANTERIOR MEDIASTINAL, EXCISION:  -  Mantle cell lymphoma, see note.   D. LYMPH NODES, ANTERIOR MEDIASTINAL #2, RESECTION:  -  Mantle cell lymphoma, see note.   The various lymph nodes present in parts a through D show a varied amount of architectural preservation with the obvious presence of retained secondary follicle/germinal centers with evidence of polarization and tingible body macrophages.  In addition, many of the lymph nodes show the preservation of open sinuses while other lymph nodes show loss of sinuses and evidence of extracapsular extension by small lymphocytes.  Overall there is a prominent nodular pattern, which depending on the node is secondary to prominent expansion of the mantle zone cuff up to including a partial diffuse infiltrate of the cortex and paracortex.  The majority of the cells are CD20 positive B cells which include both the cells involve and mantle zone expansion and focal diffuse infiltrate as well as those present in the reactive germinal centers (CD10/BCL6 positive and appropriately Bcl-2 negative CD23 highlights a follicular dendritic cell meshwork) the cells of interest in the expanded mantle zone cuffs and partial diffuse nature are weakly coexpressing CD5 as compared to the CD3/CD43 positive T cells.  In addition they are also strongly positive for nuclear expression of cyclin D1.  Ki-67 shows a normal immunoarchitecture in the areas of germinal centers and is overall  less than 5% overall in the diffuse areas and expanded mantle cell cuffs.  Given the patchy nature of the disease the aforementioned immunohistochemical stains were performed on parts B, C and D.  In addition, flow cytometry, performed on part A, identifies 76% of the cells to be lambda restricted CD5 dim positive B cells.  These findings are consistent with mantle cell lymphoma.    10/06/2023 Cancer Staging   Staging form: Hodgkin and Non-Hodgkin Lymphoma, AJCC 8th Edition - Clinical stage from 10/06/2023: Stage II (Mantle cell lymphoma) - Signed by Babara Call, MD on 10/06/2023 Stage prefix: Initial diagnosis T(11;18)(q21;q21): Unknown HCV: Unknown    Patient has history of COPD, mild shortness of breath with exertion..  he remains active for his age.  He plays golf 3-4 times a week.  Former smoker, quit in October 2022. Patient denies any GI symptoms except chronic acid reflux which is controlled with PPI.  Denies any unintentional weight loss, night sweats or fever.  MEDICAL HISTORY:  Past Medical History:  Diagnosis Date   Anxiety    in small places   Arthritis    COPD (  chronic obstructive pulmonary disease) (HCC)    Pt denies   Coronary artery disease    GERD (gastroesophageal reflux disease)    Hypertension    Iliac aneurysm (HCC) 01/16/2021   Panic attacks    Peripheral vascular disease (HCC)    Iliac Artery Aneurysm Repair    SURGICAL HISTORY: Past Surgical History:  Procedure Laterality Date   CATARACT EXTRACTION W/ INTRAOCULAR LENS IMPLANT Right    ENDOVASCULAR REPAIR/STENT GRAFT N/A 01/16/2021   Procedure: ENDOVASCULAR REPAIR/STENT GRAFT;  Surgeon: Marea Selinda RAMAN, MD;  Location: [facility];  Service: Cardiovascular;  Laterality: N/A;   EXCISION, MASS, MEDIASTINUM, ROBOT-ASSISTED Right 09/14/2023   Procedure: EXCISION, MASS, MEDIASTINUM, ROBOT-ASSISTED;  Surgeon: Kerrin Elspeth BROCKS, MD;  Location: [facility] OR;  Service: Thoracic;  Laterality: Right;  RIGHT ROBOTIC  RESECTION OF PERICARDIAL FAT PAD AND MEDIASTINAL LYMPH NODES   ILIAC ARTERY ANEURYSM REPAIR  01/16/2021   INTERCOSTAL NERVE BLOCK N/A 09/14/2023   Procedure: BLOCK, NERVE, INTERCOSTAL;  Surgeon: Kerrin Elspeth BROCKS, MD;  Location: [facility] OR;  Service: Thoracic;  Laterality: N/A;   TOTAL HIP ARTHROPLASTY Left 06/11/2021   Procedure: TOTAL HIP ARTHROPLASTY ANTERIOR APPROACH;  Surgeon: Kathlynn Sharper, MD;  Location: [facility] ORS;  Service: Orthopedics;  Laterality: Left;    SOCIAL HISTORY: Social History   Socioeconomic History   Marital status: Married    Spouse name: Susie   Number of children: 1   Years of education: Not on file   Highest education level: Not on file  Occupational History   Not on file  Tobacco Use   Smoking status: Former    Current packs/day: 0.00    Average packs/day: 1 pack/day for 52.8 years (52.8 ttl pk-yrs)    Types: Cigarettes    Start date: 64    Quit date: 12/13/2021    Years since quitting: 1.8   Smokeless tobacco: Never   Tobacco comments:    Quit smoking 11/2021  Vaping Use   Vaping status: Never Used  Substance and Sexual Activity   Alcohol use: Not Currently    Comment: with colds   Drug use: Never   Sexual activity: Yes  Other Topics Concern   Not on file  Social History Narrative   Not on file   Social Drivers of Health   Financial Resource Strain: Low Risk  (10/06/2023)   Overall Financial Resource Strain (CARDIA)    Difficulty of Paying Living Expenses: Not very hard  Food Insecurity: No Food Insecurity (10/06/2023)   Hunger Vital Sign    Worried About Running Out of Food in the Last Year: Never true    Ran Out of Food in the Last Year: Never true  Transportation Needs: No Transportation Needs (10/06/2023)   PRAPARE - Administrator, Civil Service (Medical): No    Lack of Transportation (Non-Medical): No  Physical Activity: Not on file  Stress: No Stress Concern Present (10/06/2023)   [employer] of Occupational  Health - Occupational Stress Questionnaire    Feeling of Stress: Not at all  Social Connections: Socially Integrated (09/14/2023)   Social Connection and Isolation Panel    Frequency of Communication with Friends and Family: Three times a week    Frequency of Social Gatherings with Friends and Family: Once a week    Attends Religious Services: More than 4 times per year    Active Member of [employer] or Organizations: Yes    Attends Banker Meetings: 1 to 4 times per year  Marital Status: Married  Catering manager Violence: Not At Risk (10/06/2023)   Humiliation, Afraid, Rape, and Kick questionnaire    Fear of Current or Ex-Partner: No    Emotionally Abused: No    Physically Abused: No    Sexually Abused: No    FAMILY HISTORY: Family History  Problem Relation Age of Onset   Diabetes Mother    Hypertension Mother    Diabetes Father    Hypertension Father    Leukemia Sister    Diabetes Brother    Dementia Sister    COPD Sister    Healthy Sister     ALLERGIES:  has no known allergies.  MEDICATIONS:  Current Outpatient Medications  Medication Sig Dispense Refill   amLODipine  (NORVASC ) 5 MG tablet Take 1 tablet (5 mg total) by mouth daily. 30 tablet 3   aspirin  EC 81 MG EC tablet Take 1 tablet (81 mg total) by mouth daily. Swallow whole. 30 tablet 11   cetirizine (ZYRTEC) 10 MG tablet Take 10 mg by mouth daily.     ezetimibe  (ZETIA ) 10 MG tablet Take 1 tablet (10 mg total) by mouth daily. 90 tablet 3   NEXIUM 40 MG capsule Take 40 mg by mouth daily as needed (indigestion / heart burn).     pravastatin  (PRAVACHOL ) 20 MG tablet Take 1 tablet (20 mg total) by mouth daily.     No current facility-administered medications for this visit.    Review of Systems  Constitutional:  Negative for appetite change, chills, fatigue, fever and unexpected weight change.  HENT:   Negative for hearing loss and voice change.   Eyes:  Negative for eye problems and icterus.   Respiratory:  Negative for chest tightness, cough and shortness of breath.   Cardiovascular:  Negative for chest pain and leg swelling.  Gastrointestinal:  Negative for abdominal distention and abdominal pain.  Endocrine: Negative for hot flashes.  Genitourinary:  Negative for difficulty urinating, dysuria and frequency.   Musculoskeletal:  Negative for arthralgias.  Skin:  Negative for itching and rash.  Neurological:  Negative for light-headedness and numbness.  Hematological:  Negative for adenopathy. Does not bruise/bleed easily.  Psychiatric/Behavioral:  Negative for confusion.      PHYSICAL EXAMINATION: ECOG PERFORMANCE STATUS: 1 - Symptomatic but completely ambulatory  Vitals:   10/06/23 1115  BP: (!) 141/77  Pulse: 70  Resp: 18  Temp: (!) 96 F (35.6 C)  SpO2: 97%   Filed Weights   10/06/23 1115  Weight: 230 lb 3.2 oz (104.4 kg)    Physical Exam Constitutional:      General: He is not in acute distress.    Appearance: He is not diaphoretic.  HENT:     Head: Normocephalic and atraumatic.     Nose: Nose normal.  Eyes:     General: No scleral icterus.    Pupils: Pupils are equal, round, and reactive to light.  Cardiovascular:     Rate and Rhythm: Normal rate and regular rhythm.     Heart sounds: No murmur heard. Pulmonary:     Effort: Pulmonary effort is normal. No respiratory distress.     Breath sounds: No wheezing.  Abdominal:     General: There is no distension.     Palpations: Abdomen is soft.     Tenderness: There is no abdominal tenderness.  Musculoskeletal:        General: Normal range of motion.     Cervical back: Normal range of motion and neck supple.  Skin:    General: Skin is warm and dry.     Findings: No erythema.  Neurological:     Mental Status: He is alert and oriented to person, place, and time. Mental status is at baseline.     Cranial Nerves: No cranial nerve deficit.     Motor: No abnormal muscle tone.  Psychiatric:        Mood  and Affect: Mood and affect normal.      LABORATORY DATA:  I have reviewed the data as listed    Latest Ref Rng & Units 10/06/2023   11:52 AM 09/15/2023    3:00 AM 09/10/2023    1:58 PM  CBC  WBC 4.0 - 10.5 K/uL 12.2  16.5  12.5   Hemoglobin 13.0 - 17.0 g/dL 87.2  88.8  87.5   Hematocrit 39.0 - 52.0 % 39.0  34.7  39.0   Platelets 150 - 400 K/uL 146  130  147       Latest Ref Rng & Units 09/15/2023    3:00 AM 09/10/2023    1:58 PM 06/12/2021    4:13 AM  CMP  Glucose 70 - 99 mg/dL 861  883  849   BUN 8 - 23 mg/dL 19  16  18    Creatinine 0.61 - 1.24 mg/dL 8.64  8.81  8.79   Sodium 135 - 145 mmol/L 137  140  137   Potassium 3.5 - 5.1 mmol/L 4.5  4.2  4.3   Chloride 98 - 111 mmol/L 109  106  106   CO2 22 - 32 mmol/L 22  26  26    Calcium  8.9 - 10.3 mg/dL 8.4  9.0  8.4   Total Protein 6.5 - 8.1 g/dL  7.2    Total Bilirubin 0.0 - 1.2 mg/dL  1.1    Alkaline Phos 38 - 126 U/L  89    AST 15 - 41 U/L  22    ALT 0 - 44 U/L  18       RADIOGRAPHIC STUDIES: I have personally reviewed the radiological images as listed and agreed with the findings in the report. DG Chest 2 View Result Date: 09/22/2023 CLINICAL DATA:  Mediastinal mass EXAM: CHEST - 2 VIEW COMPARISON:  09/15/2023 FINDINGS: Frontal and lateral views of the chest demonstrate an unremarkable cardiac silhouette. Interval removal of the right chest tube. No acute airspace disease, effusion, or pneumothorax. No acute bony abnormalities. IMPRESSION: 1. No acute intrathoracic process. No complication after right chest tube removal. Electronically Signed   By: Ozell Daring M.D.   On: 09/22/2023 12:23   DG Chest Port 1 View Result Date: 09/15/2023 CLINICAL DATA:  Post mediastinal mass EXAM: PORTABLE CHEST 1 VIEW COMPARISON:  Radiograph 09/14/2023 FINDINGS: Normal cardiac silhouette. RIGHT chest tube in place. No pneumothorax. No pulmonary edema. No pleural fluid. IMPRESSION: RIGHT chest tube in place. No pneumothorax. Electronically  Signed   By: Jackquline Boxer M.D.   On: 09/15/2023 09:46   DG Chest Port 1 View Result Date: 09/14/2023 CLINICAL DATA:  Mediastinal mass status post excision EXAM: PORTABLE CHEST 1 VIEW COMPARISON:  09/14/2018 FINDINGS: Low lung volumes are present, causing crowding of the pulmonary vasculature. Atherosclerotic calcification of the aortic arch. Mild enlargement of the cardiopericardial silhouette, new from the preoperative exam although the low lung volumes may be contributory to this appearance. Indistinct pulmonary vasculature, pulmonary venous hypertension not excluded. No overt edema or blunting of the costophrenic angles. No significant pneumothorax. If there is  a mediastinal drain and is not very radiographically appreciable. Some tubing projects over the right chest. IMPRESSION: 1. Mild enlargement of the cardiopericardial silhouette, new from the preoperative exam although the low lung volumes may be contributory to this appearance. 2. Indistinct pulmonary vasculature, pulmonary venous hypertension not excluded. 3. Aortic Atherosclerosis (ICD10-I70.0). Electronically Signed   By: Ryan Salvage M.D.   On: 09/14/2023 11:27   DG Chest 2 View Result Date: 09/14/2023 CLINICAL DATA:  Preoperative evaluation for mediastinal lymphadenopathy EXAM: CHEST - 2 VIEW COMPARISON:  CT chest dated 06/09/2023 FINDINGS: Normal lung volumes. No focal consolidations. No pleural effusion or pneumothorax. The heart size and mediastinal contours are within normal limits. No acute osseous abnormality. IMPRESSION: No active cardiopulmonary disease. Electronically Signed   By: Limin  Xu M.D.   On: 09/14/2023 09:08

## 2023-10-12 LAB — COMP PANEL: LEUKEMIA/LYMPHOMA: Immunophenotypic Profile: 53

## 2023-10-15 ENCOUNTER — Other Ambulatory Visit: Payer: Self-pay | Admitting: Oncology

## 2023-10-16 ENCOUNTER — Encounter: Payer: Self-pay | Admitting: *Deleted

## 2023-10-19 NOTE — Progress Notes (Unsigned)
 Cardiology Clinic Note   Date: 10/22/2023 ID: Blake, Padilla [birth year], MRN [id number]  Primary Cardiologist:  Redell Cave, MD  Chief Complaint   Blake Padilla is a 73 y.o. male who presents to the clinic today for routine follow up.   Patient Profile   Blake Padilla is followed by Dr. Cave for the history outlined below.      Past medical history significant for: Coronary artery calcification. Echo 03/29/2020: EF 60 to 65%.  No RWMA.  Mild to moderate LVH.  Grade I DD.  Normal RV size/function.  Mild LAE.  Mild MR. Iliac artery aneurysm. Endovascular repair 01/16/2021: Coil embolization of the left hypogastric artery.  Placement of endoprosthesis right iliac contralateral hip.  Placement of iliac extension limb to the left external iliac artery to explant the aneurysm. EVAR duplex 04/10/2023: There is evidence of abnormal dilatation of the distal abdominal aorta 3.2 cm (previous measurement 3.3 cm February 2024).  Patent endovascular aneurysm repair with no evidence of endoleak. Hypertension. Hyperlipidemia. Lipid panel 03/25/2021: LDL 67, HDL 43, TG 65, total 124. COPD. Mantle cell lymphoma. Former tobacco abuse.  In summary, patient was first evaluated by Dr. Cave on 03/19/2020 for coronary artery calcification seen on CT.  Patient denied any cardiac symptoms.  He underwent echo which demonstrated normal LV/RV function as detailed above.  He has a history of left iliac artery aneurysm s/p repair in November 2022 by vascular surgery.  Last ultrasound February 2025 showed patent repair with no evidence of endoleak.  Patient was last seen in the office by Dr. Cave on 10/21/2022 for routine follow-up.  He was doing well at that time and no medication changes were made.     History of Present Illness    Today, patient is here alone. He is doing well. Patient denies lower extremity edema, orthopnea or PND. He has baseline dyspnea with heavier  exertion but none with routine activities. He underwent biopsy for a mass in his chest and is being followed by oncology awaiting a treatment plan.  No chest pain, pressure, or tightness. No palpitations.  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 He also performs yard work at home.     ROS: All other systems reviewed and are otherwise negative except as noted in History of Present Illness.  EKGs/Labs Reviewed    EKG Interpretation Date/Time:  Thursday October 22 2023 08:43:40 EDT Ventricular Rate:  70 PR Interval:  154 QRS Duration:  102 QT Interval:  414 QTC Calculation: 447 R Axis:   38  Text Interpretation: Normal sinus rhythm Nonspecific ST abnormality When compared with ECG of 10-Sep-2023 13:49, Premature atrial complexes are no longer Present Confirmed by Loistine Sober [phone]) on 10/22/2023 8:54:05 AM   09/10/2023: ALT 18; AST 22 09/15/2023: BUN 19; Creatinine, Ser 1.35; Potassium 4.5; Sodium 137   10/06/2023: Hemoglobin 12.7; WBC 12.2    Physical Exam    VS:  BP 130/76 (BP Location: Left Arm, Patient Position: Sitting, Cuff Size: Normal)   Pulse 70   Ht 6' 3 (1.905 m)   Wt 231 lb (104.8 kg)   SpO2 99%   BMI 28.87 kg/m  , BMI Body mass index is 28.87 kg/m.  GEN: Well nourished, well developed, in no acute distress. Neck: No JVD or carotid bruits. Cardiac:  RRR.  No murmur. No rubs or gallops.   Respiratory:  Respirations regular and unlabored. Clear to auscultation without rales, wheezing or rhonchi. GI:  Soft, nontender, nondistended. Extremities: Radials/DP/PT 2+ and equal bilaterally. No clubbing or cyanosis. No edema.  Skin: Warm and dry, no rash. Neuro: Strength intact.  Assessment & Plan   Coronary artery calcification LAD and LCx atherosclerosis seen on CT chest April 2025.  Echo February 2023 demonstrated normal LV/RV function, mild to moderate LVH, Grade I DD, mild LAE, mild MR.  Patient denies chest pain, pressure or tightness. 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  - Continue aspirin , pravastatin , Zetia .  Hypertension BP today 138/84 on intake and 130/76 on recheck. He denies headaches, dizziness or vision changes. He reports getting stuck in traffic and being aggravated today which is why his BP is up. Home BP is typically normal.  - Continue amlodipine .  Hyperlipidemia LDL 67 January 2023, at goal. - Continue pravastatin  and Zetia . - Lipid panel and CMP today.   Iliac artery aneurysm S/p endovascular repair November 2022.  EVAR duplex February 2025 demonstrated patent endovascular aneurysm repair with no evidence of endoleak.  Patient reports right lower extremity always slightly bigger than left. No claudication reported.  - Continue to follow with vascular surgery.  Disposition: Lipid panel and CMP today. Return in 1 year or sooner as needed.          Signed, Barnie HERO. Tawnia Schirm, DNP, NP-C

## 2023-10-22 ENCOUNTER — Encounter: Payer: Self-pay | Admitting: Student

## 2023-10-22 ENCOUNTER — Ambulatory Visit: Attending: Student | Admitting: Student

## 2023-10-22 ENCOUNTER — Inpatient Hospital Stay

## 2023-10-22 VITALS — BP 130/76 | HR 70 | Ht 75.0 in | Wt 231.0 lb

## 2023-10-22 DIAGNOSIS — Z79899 Other long term (current) drug therapy: Secondary | ICD-10-CM

## 2023-10-22 DIAGNOSIS — I2510 Atherosclerotic heart disease of native coronary artery without angina pectoris: Secondary | ICD-10-CM | POA: Diagnosis not present

## 2023-10-22 DIAGNOSIS — I10 Essential (primary) hypertension: Secondary | ICD-10-CM | POA: Diagnosis not present

## 2023-10-22 DIAGNOSIS — I723 Aneurysm of iliac artery: Secondary | ICD-10-CM

## 2023-10-22 DIAGNOSIS — E785 Hyperlipidemia, unspecified: Secondary | ICD-10-CM | POA: Diagnosis not present

## 2023-10-22 NOTE — Patient Instructions (Signed)
 Medication Instructions:  Your physician recommends that you continue on your current medications as directed. Please refer to the Current Medication list given to you today.   *If you need a refill on your cardiac medications before your next appointment, please call your pharmacy*  Lab Work: Your provider would like for you to have following labs drawn today CMP and lipids.   If you have labs (blood work) drawn today and your tests are completely normal, you will receive your results only by: MyChart Message (if you have MyChart) OR A paper copy in the mail If you have any lab test that is abnormal or we need to change your treatment, we will call you to review the results.  Follow-Up: At [facility], you and your health needs are our priority.  As part of our continuing mission to provide you with exceptional heart care, our providers are all part of one team.  This team includes your primary Cardiologist (physician) and Advanced Practice Providers or APPs (Physician Assistants and Nurse Practitioners) who all work together to provide you with the care you need, when you need it.  Your next appointment:   1 year(s)  Provider:   You may see Redell Cave, MD or Barnie Hila, NP   We recommend signing up for the patient portal called MyChart.  Sign up information is provided on this After Visit Summary.  MyChart is used to connect with patients for Virtual Visits (Telemedicine).  Patients are able to view lab/test results, encounter notes, upcoming appointments, etc.  Non-urgent messages can be sent to your provider as well.   To learn more about what you can do with MyChart, go to [email or web address].

## 2023-10-23 ENCOUNTER — Ambulatory Visit: Payer: Self-pay | Admitting: Student

## 2023-10-23 LAB — COMPREHENSIVE METABOLIC PANEL WITH GFR
ALT: 14 IU/L (ref 0–44)
AST: 24 IU/L (ref 0–40)
Albumin: 4.4 g/dL (ref 3.8–4.8)
Alkaline Phosphatase: 126 IU/L — ABNORMAL HIGH (ref 44–121)
BUN/Creatinine Ratio: 14 (ref 10–24)
BUN: 16 mg/dL (ref 8–27)
Bilirubin Total: 0.7 mg/dL (ref 0.0–1.2)
CO2: 22 mmol/L (ref 20–29)
Calcium: 9.1 mg/dL (ref 8.6–10.2)
Chloride: 106 mmol/L (ref 96–106)
Creatinine, Ser: 1.15 mg/dL (ref 0.76–1.27)
Globulin, Total: 2.7 g/dL (ref 1.5–4.5)
Glucose: 116 mg/dL — ABNORMAL HIGH (ref 70–99)
Potassium: 4.1 mmol/L (ref 3.5–5.2)
Sodium: 141 mmol/L (ref 134–144)
Total Protein: 7.1 g/dL (ref 6.0–8.5)
eGFR: 67 mL/min/1.73 (ref >59)

## 2023-10-23 LAB — LIPID PANEL
Chol/HDL Ratio: 4.1 ratio (ref 0.0–5.0)
Cholesterol, Total: 139 mg/dL (ref 100–199)
HDL: 34 mg/dL — ABNORMAL LOW (ref >39)
LDL Chol Calc (NIH): 91 mg/dL (ref 0–99)
Triglycerides: 70 mg/dL (ref 0–149)
VLDL Cholesterol Cal: 14 mg/dL (ref 5–40)

## 2023-10-23 LAB — SURGICAL PATHOLOGY

## 2023-10-25 ENCOUNTER — Encounter: Payer: Self-pay | Admitting: Oncology

## 2023-10-25 NOTE — Progress Notes (Addendum)
 START OFF PATHWAY REGIMEN - Lymphoma and CLL   [id number]:Acalabrutinib  100 mg PO BID D1-28 + Rituximab 375 mg/m2 IV D1 q28 Days:   A cycle is every 28 days:     Acalabrutinib       Rituximab-xxxx   **Always confirm dose/schedule in your pharmacy ordering system**  Patient Characteristics: Mantle Cell Lymphoma, Initial Therapy, Aggressive Disease or Treatment Indicated, Stage II - IV, Unfit or Age > 70, TP53 Mutation Not Present Disease Type: Not Applicable Disease Type: Mantle Cell Lymphoma Disease Type: Not Applicable Was TP53 Mutation Testing Completed<= Yes, TP53 Mutation Negative Line of Therapy: Initial Therapy Intent of Therapy: Curative Intent, Discussed with Patient

## 2023-10-27 ENCOUNTER — Encounter: Payer: Self-pay | Admitting: Oncology

## 2023-10-27 ENCOUNTER — Other Ambulatory Visit: Payer: Self-pay

## 2023-10-27 ENCOUNTER — Telehealth: Payer: Self-pay | Admitting: Pharmacist

## 2023-10-27 ENCOUNTER — Other Ambulatory Visit (HOSPITAL_COMMUNITY): Payer: Self-pay

## 2023-10-27 DIAGNOSIS — C8312 Mantle cell lymphoma, intrathoracic lymph nodes: Secondary | ICD-10-CM

## 2023-10-27 MED ORDER — CALQUENCE 100 MG PO TABS
100.0000 mg | ORAL_TABLET | Freq: Two times a day (BID) | ORAL | 1 refills | Status: DC
  Filled 2023-11-03: qty 60, 30d supply, fill #0
  Filled 2023-12-02: qty 60, 30d supply, fill #1

## 2023-10-27 NOTE — Telephone Encounter (Signed)
 Clinical Pharmacist Practitioner Encounter   Received new prescription for Calquence  (acalabrutinib ) for the treatment of mantle cell lymphoma in conjunction with rituximab, planned duration until disease progression or unacceptable drug toxicity.  CMP from 10/22/23 assessed, no relevant lab abnormalities. Prescription dose and frequency assessed.   Current medication list in Epic reviewed, one DDIs with acalabrutinib  identified: Aspirin : Acalabrutinib  may increase antiplatelet effects of aspirin . Monitor for signs of bleeding. No baseline dose adjustment needed.   Evaluated chart and no patient barriers to medication adherence identified.   Prescription has been e-scribed to the [facility] for benefits analysis and approval.  Oral Oncology Clinic will continue to follow for insurance authorization, copayment issues, initial counseling and start date.   Kevonta Phariss N. Jaxen Samples, PharmD, BCOP, CPP Hematology/Oncology Clinical Pharmacist [facility] Oral Chemotherapy [facility] [phone]  10/27/2023 8:58 AM

## 2023-10-27 NOTE — Telephone Encounter (Signed)
 Clinical Pharmacist Practitioner Encounter  New authorization  Received notification from [facility] that prior authorization for Calquence  is required.   PA submitted on CMM Key [id number] Status is pending   Oral Oncology Clinic will continue to follow.   Blake Padilla N. Lenette Rau, PharmD, BCOP, CPP Hematology/Oncology Clinical Pharmacist [facility] [phone]  10/27/2023 3:05 PM

## 2023-10-27 NOTE — Telephone Encounter (Signed)
 Clinical Pharmacist Practitioner Encounter   New authorization  Prior Authorization for Calquence  has been approved.     PA#  [id number] Effective dates: 02/25/23 through 02/24/24   Oral Oncology Clinic will continue to follow.   Derisha Funderburke N. Myda Detwiler, PharmD, BCOP, CPP Hematology/Oncology Clinical Pharmacist [facility] [phone]  10/27/2023 4:38 PM

## 2023-10-28 ENCOUNTER — Telehealth: Payer: Self-pay | Admitting: Pharmacist

## 2023-10-28 ENCOUNTER — Encounter: Payer: Self-pay | Admitting: Oncology

## 2023-10-28 ENCOUNTER — Other Ambulatory Visit (HOSPITAL_COMMUNITY): Payer: Self-pay

## 2023-10-28 NOTE — Telephone Encounter (Signed)
 Copay: $1753.59  Will working on obtaining grant for patient to cover copay.

## 2023-10-28 NOTE — Telephone Encounter (Signed)
 Oral Chemotherapy Pharmacist Encounter  Successfully enrolled patient for copayment assistance funds from [facility] from the [facility] Cell Lymphoma fund.  Award amount: $8,500 Effective dates: 09/28/23 - 09/26/24 ID: [id number] BIN: [id number] Group: [id number] PCN: [id number]  Grant will be used to cover the copay of Calquence .   Billing information will be shared with [facility]. I will place a copy of the award letter to be scanned into patient's chart.  Cresencio Reesor N. Peretz Thieme, PharmD, BCOP, CPP Hematology/Oncology Clinical Pharmacist [facility] [phone]  10/28/2023 11:41 AM

## 2023-10-29 ENCOUNTER — Other Ambulatory Visit (HOSPITAL_COMMUNITY): Payer: Self-pay

## 2023-10-29 NOTE — Progress Notes (Signed)
 Pharmacist Chemotherapy Monitoring - Initial Assessment    Anticipated start date: 11/12/23   The following has been reviewed per standard work regarding the patient's treatment regimen: The patient's diagnosis, treatment plan and drug doses, and organ/hematologic function Lab orders and baseline tests specific to treatment regimen  The treatment plan start date, drug sequencing, and pre-medications Prior authorization status  Patient's documented medication list, including drug-drug interaction screen and prescriptions for anti-emetics and supportive care specific to the treatment regimen The drug concentrations, fluid compatibility, administration routes, and timing of the medications to be used The patient's access for treatment and lifetime cumulative dose history, if applicable  The patient's medication allergies and previous infusion related reactions, if applicable   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  Ki-67 less than 5%  acalabrutinib  plus rituximab.  Hepatitis panel non reactive    Blake Padilla, [facility], 10/29/2023  2:48 PM

## 2023-11-02 ENCOUNTER — Encounter: Payer: Self-pay | Admitting: Oncology

## 2023-11-03 ENCOUNTER — Encounter: Payer: Self-pay | Admitting: Oncology

## 2023-11-03 ENCOUNTER — Telehealth: Payer: Self-pay | Admitting: Pharmacy Technician

## 2023-11-03 ENCOUNTER — Other Ambulatory Visit (HOSPITAL_COMMUNITY): Payer: Self-pay

## 2023-11-03 ENCOUNTER — Other Ambulatory Visit: Payer: Self-pay

## 2023-11-03 ENCOUNTER — Inpatient Hospital Stay

## 2023-11-03 ENCOUNTER — Inpatient Hospital Stay: Attending: Oncology

## 2023-11-03 ENCOUNTER — Other Ambulatory Visit: Payer: Self-pay | Admitting: Pharmacy Technician

## 2023-11-03 DIAGNOSIS — D696 Thrombocytopenia, unspecified: Secondary | ICD-10-CM | POA: Insufficient documentation

## 2023-11-03 DIAGNOSIS — F419 Anxiety disorder, unspecified: Secondary | ICD-10-CM | POA: Insufficient documentation

## 2023-11-03 DIAGNOSIS — Z5112 Encounter for antineoplastic immunotherapy: Secondary | ICD-10-CM | POA: Insufficient documentation

## 2023-11-03 DIAGNOSIS — Z79899 Other long term (current) drug therapy: Secondary | ICD-10-CM | POA: Insufficient documentation

## 2023-11-03 DIAGNOSIS — Z806 Family history of leukemia: Secondary | ICD-10-CM | POA: Insufficient documentation

## 2023-11-03 DIAGNOSIS — Z87891 Personal history of nicotine dependence: Secondary | ICD-10-CM | POA: Insufficient documentation

## 2023-11-03 DIAGNOSIS — C8312 Mantle cell lymphoma, intrathoracic lymph nodes: Secondary | ICD-10-CM | POA: Insufficient documentation

## 2023-11-03 DIAGNOSIS — D649 Anemia, unspecified: Secondary | ICD-10-CM | POA: Insufficient documentation

## 2023-11-03 DIAGNOSIS — Z7982 Long term (current) use of aspirin: Secondary | ICD-10-CM | POA: Insufficient documentation

## 2023-11-03 NOTE — Telephone Encounter (Signed)
 Patient successfully OnBoarded and drug education provided by pharmacist. Medication scheduled to be shipped on 09/15 for delivery on 09/16 from [facility] to patient's address. Patient also knows to call me at [phone] with any questions or concerns regarding receiving medication or if there is any unexpected change in co-pay.   Blake Padilla (Patty) Chet Burnet, CPhT  [facility], [name], [facility] Oral Chemotherapy Patient Advocate Specialist III Phone: [phone]  Fax: [phone]

## 2023-11-03 NOTE — Progress Notes (Signed)
 [facility] CSW Progress Note  Clinical Social Work introduced self to patient during Patient Education with Blake Padilla, Charity fundraiser.  Provided information regarding CSW role, including counseling, advanced care planning and support group.  Answered questions as needed.  Follow Up Plan:  CSW will follow-up with patient by phone     Macario CHRISTELLA Au, LCSW Clinical Social Worker [facility]

## 2023-11-03 NOTE — Progress Notes (Signed)
 Specialty Pharmacy Initiation Note   Blake Padilla is a 73 y.o. male who will be followed by the specialty pharmacy service for RxSp Oncology    Review of administration, indication, effectiveness, safety, potential side effects, storage/disposable, and missed dose instructions occurred today for patient's specialty medication(s) No data recorded    Patient/Caregiver did not have any additional questions or concerns.   Patient's therapy is appropriate to: Initiate    Goals Addressed             This Visit's Progress    Achieve or maintain remission       Patient is initiating therapy. Patient will maintain adherence         Blake Padilla Specialty Pharmacist

## 2023-11-03 NOTE — Telephone Encounter (Signed)
 Oral Oncology Patient Advocate Florencia Church ID: [id number]  [place] (Patty) Chet Burnet, CPhT  [facility], [name], [facility] Oral Chemotherapy Patient Advocate Specialist III Phone: [phone]  Fax: [phone]

## 2023-11-03 NOTE — Progress Notes (Signed)
 Specialty Pharmacy Initial Fill Coordination Note  KYLAND NO is a 73 y.o. male contacted today regarding refills of specialty medication(s) Acalabrutinib  Maleate (Calquence ) .  Patient requested Delivery  on 11/10/23  to verified address [address] [place] [address]   Medication will be filled on 11/09/2023.   Patient is aware of $0 copayment. Leisure centre manager.  Evangela Heffler (Patty) Chet Burnet, CPhT  [facility], [name], [facility] Oral Chemotherapy Patient Advocate Specialist III Phone: [phone]  Fax: [phone]

## 2023-11-09 ENCOUNTER — Other Ambulatory Visit: Payer: Self-pay

## 2023-11-12 ENCOUNTER — Other Ambulatory Visit: Payer: Self-pay

## 2023-11-12 ENCOUNTER — Inpatient Hospital Stay (HOSPITAL_BASED_OUTPATIENT_CLINIC_OR_DEPARTMENT_OTHER): Admitting: Oncology

## 2023-11-12 ENCOUNTER — Inpatient Hospital Stay

## 2023-11-12 ENCOUNTER — Encounter: Payer: Self-pay | Admitting: Oncology

## 2023-11-12 ENCOUNTER — Inpatient Hospital Stay: Admitting: Pharmacist

## 2023-11-12 VITALS — BP 124/69 | HR 97 | Temp 99.0°F | Resp 18

## 2023-11-12 VITALS — BP 137/77 | HR 78 | Temp 97.6°F | Resp 17 | Wt 232.1 lb

## 2023-11-12 DIAGNOSIS — D696 Thrombocytopenia, unspecified: Secondary | ICD-10-CM

## 2023-11-12 DIAGNOSIS — Z5112 Encounter for antineoplastic immunotherapy: Secondary | ICD-10-CM | POA: Diagnosis present

## 2023-11-12 DIAGNOSIS — C8312 Mantle cell lymphoma, intrathoracic lymph nodes: Secondary | ICD-10-CM

## 2023-11-12 DIAGNOSIS — F419 Anxiety disorder, unspecified: Secondary | ICD-10-CM | POA: Insufficient documentation

## 2023-11-12 DIAGNOSIS — Z87891 Personal history of nicotine dependence: Secondary | ICD-10-CM | POA: Diagnosis not present

## 2023-11-12 DIAGNOSIS — Z79899 Other long term (current) drug therapy: Secondary | ICD-10-CM | POA: Diagnosis not present

## 2023-11-12 DIAGNOSIS — D649 Anemia, unspecified: Secondary | ICD-10-CM

## 2023-11-12 DIAGNOSIS — Z7982 Long term (current) use of aspirin: Secondary | ICD-10-CM | POA: Diagnosis not present

## 2023-11-12 DIAGNOSIS — Z806 Family history of leukemia: Secondary | ICD-10-CM | POA: Diagnosis not present

## 2023-11-12 LAB — CMP (CANCER CENTER ONLY)
ALT: 21 U/L (ref 0–44)
AST: 27 U/L (ref 15–41)
Albumin: 3.9 g/dL (ref 3.5–5.0)
Alkaline Phosphatase: 93 U/L (ref 38–126)
Anion gap: 7 (ref 5–15)
BUN: 15 mg/dL (ref 8–23)
CO2: 23 mmol/L (ref 22–32)
Calcium: 8.7 mg/dL — ABNORMAL LOW (ref 8.9–10.3)
Chloride: 107 mmol/L (ref 98–111)
Creatinine: 1.16 mg/dL (ref 0.61–1.24)
GFR, Estimated: >60 mL/min (ref >60)
Glucose, Bld: 147 mg/dL — ABNORMAL HIGH (ref 70–99)
Potassium: 4.2 mmol/L (ref 3.5–5.1)
Sodium: 137 mmol/L (ref 135–145)
Total Bilirubin: 0.9 mg/dL (ref 0.0–1.2)
Total Protein: 7.1 g/dL (ref 6.5–8.1)

## 2023-11-12 LAB — CBC (CANCER CENTER ONLY)
HCT: 37.9 % — ABNORMAL LOW (ref 39.0–52.0)
Hemoglobin: 12.1 g/dL — ABNORMAL LOW (ref 13.0–17.0)
MCH: 28.6 pg (ref 26.0–34.0)
MCHC: 31.9 g/dL (ref 30.0–36.0)
MCV: 89.6 fL (ref 80.0–100.0)
Platelet Count: 131 K/uL — ABNORMAL LOW (ref 150–400)
RBC: 4.23 MIL/uL (ref 4.22–5.81)
RDW: 14.2 % (ref 11.5–15.5)
WBC Count: 17.3 K/uL — ABNORMAL HIGH (ref 4.0–10.5)
nRBC: 0 % (ref 0.0–0.2)

## 2023-11-12 MED ORDER — SODIUM CHLORIDE 0.9 % IV SOLN
375.0000 mg/m2 | Freq: Once | INTRAVENOUS | Status: AC
  Administered 2023-11-12: 900 mg via INTRAVENOUS
  Filled 2023-11-12: qty 40

## 2023-11-12 MED ORDER — SODIUM CHLORIDE 0.9 % IV SOLN
INTRAVENOUS | Status: DC
  Filled 2023-11-12: qty 250

## 2023-11-12 MED ORDER — LORAZEPAM 2 MG/ML IJ SOLN
0.5000 mg | INTRAMUSCULAR | Status: DC | PRN
  Administered 2023-11-12: 0.5 mg via INTRAVENOUS
  Filled 2023-11-12: qty 1

## 2023-11-12 MED ORDER — ACETAMINOPHEN 325 MG PO TABS
650.0000 mg | ORAL_TABLET | Freq: Once | ORAL | Status: AC
  Administered 2023-11-12: 650 mg via ORAL
  Filled 2023-11-12: qty 2

## 2023-11-12 MED ORDER — DIPHENHYDRAMINE HCL 25 MG PO CAPS
50.0000 mg | ORAL_CAPSULE | Freq: Once | ORAL | Status: AC
  Administered 2023-11-12: 50 mg via ORAL
  Filled 2023-11-12: qty 2

## 2023-11-12 MED ORDER — LORAZEPAM 1 MG PO TABS: 1.0000 mg | ORAL_TABLET | ORAL | Status: DC | PRN

## 2023-11-12 NOTE — Assessment & Plan Note (Addendum)
 Patient will get Ativan  o.5mg  IV PRN anxiety prior to chemo.

## 2023-11-12 NOTE — Patient Instructions (Signed)
 [facility] - A DEPT OF [facility]  Discharge Instructions: Thank you for choosing [facility] to provide your oncology and hematology care.  If you have a lab appointment with the Cancer Center, please go directly to the Cancer Center and check in at the registration area.  Wear comfortable clothing and clothing appropriate for easy access to any Portacath or PICC line.   We strive to give you quality time with your provider. You may need to reschedule your appointment if you arrive late (15 or more minutes).  Arriving late affects you and other patients whose appointments are after yours.  Also, if you miss three or more appointments without notifying the office, you may be dismissed from the clinic at the provider's discretion.      For prescription refill requests, have your pharmacy contact our office and allow 72 hours for refills to be completed.    Today you received the following chemotherapy and/or immunotherapy agents rituximab    To help prevent nausea and vomiting after your treatment, we encourage you to take your nausea medication as directed.  BELOW ARE SYMPTOMS THAT SHOULD BE REPORTED IMMEDIATELY: *FEVER GREATER THAN 100.4 F (38 C) OR HIGHER *CHILLS OR SWEATING *NAUSEA AND VOMITING THAT IS NOT CONTROLLED WITH YOUR NAUSEA MEDICATION *UNUSUAL SHORTNESS OF BREATH *UNUSUAL BRUISING OR BLEEDING *URINARY PROBLEMS (pain or burning when urinating, or frequent urination) *BOWEL PROBLEMS (unusual diarrhea, constipation, pain near the anus) TENDERNESS IN MOUTH AND THROAT WITH OR WITHOUT PRESENCE OF ULCERS (sore throat, sores in mouth, or a toothache) UNUSUAL RASH, SWELLING OR PAIN  UNUSUAL VAGINAL DISCHARGE OR ITCHING   Items with * indicate a potential emergency and should be followed up as soon as possible or go to the Emergency Department if any problems should occur.  Please show the CHEMOTHERAPY ALERT CARD or IMMUNOTHERAPY  ALERT CARD at check-in to the Emergency Department and triage nurse.  Should you have questions after your visit or need to cancel or reschedule your appointment, please contact [facility] - A DEPT OF [facility]  [phone] and follow the prompts.  Office hours are 8:00 a.m. to 4:30 p.m. Monday - Friday. Please note that voicemails left after 4:00 p.m. may not be returned until the following business day.  We are closed weekends and major holidays. You have access to a nurse at all times for urgent questions. Please call the main number to the clinic [phone] and follow the prompts.  For any non-urgent questions, you may also contact your provider using MyChart. We now offer e-Visits for anyone 28 and older to request care online for non-urgent symptoms. For details visit [email or web address].   Also download the MyChart app! Go to the app store, search "MyChart", open the app, select [place], and log in with your MyChart username and password.  Rituximab Injection What is this medication? RITUXIMAB (ri TUX i mab) treats leukemia and lymphoma. It works by blocking a protein that causes cancer cells to grow and multiply. This helps to slow or stop the spread of cancer cells. It may also be used to treat autoimmune conditions, such as arthritis. It works by slowing down an overactive immune system. It is a monoclonal antibody. This medicine may be used for other purposes; ask your health care provider or pharmacist if you have questions. COMMON BRAND NAME(S): RIABNI, Rituxan, RUXIENCE, truxima What should I tell my care team before  I take this medication? They need to know if you have any of these conditions: Chest pain Heart disease Immune system problems Infection, such as chickenpox, cold sores, hepatitis B, herpes Irregular heartbeat or rhythm Kidney disease Low blood counts, such as low white cells, platelets, red cells Lung disease Recent or  upcoming vaccine An unusual or allergic reaction to rituximab, other medications, foods, dyes, or preservatives Pregnant or trying to get pregnant Breast-feeding How should I use this medication? This medication is injected into a vein. It is given by a care team in a hospital or clinic setting. A special MedGuide will be given to you before each treatment. Be sure to read this information carefully each time. Talk to your care team about the use of this medication in children. While this medication may be prescribed for children as young as 6 months for selected conditions, precautions do apply. Overdosage: If you think you have taken too much of this medicine contact a poison control center or emergency room at once. NOTE: This medicine is only for you. Do not share this medicine with others. What if I miss a dose? Keep appointments for follow-up doses. It is important not to miss your dose. Call your care team if you are unable to keep an appointment. What may interact with this medication? Do not take this medication with any of the following: Live vaccines This medication may also interact with the following: Cisplatin This list may not describe all possible interactions. Give your health care provider a list of all the medicines, herbs, non-prescription drugs, or dietary supplements you use. Also tell them if you smoke, drink alcohol, or use illegal drugs. Some items may interact with your medicine. What should I watch for while using this medication? Your condition will be monitored carefully while you are receiving this medication. You may need blood work while taking this medication. This medication can cause serious infusion reactions. To reduce the risk your care team may give you other medications to take before receiving this one. Be sure to follow the directions from your care team. This medication may increase your risk of getting an infection. Call your care team for advice if  you get a fever, chills, sore throat, or other symptoms of a cold or flu. Do not treat yourself. Try to avoid being around people who are sick. Call your care team if you are around anyone with measles, chickenpox, or if you develop sores or blisters that do not heal properly. Avoid taking medications that contain aspirin, acetaminophen, ibuprofen, naproxen, or ketoprofen unless instructed by your care team. These medications may hide a fever. This medication may cause serious skin reactions. They can happen weeks to months after starting the medication. Contact your care team right away if you notice fevers or flu-like symptoms with a rash. The rash may be red or purple and then turn into blisters or peeling of the skin. You may also notice a red rash with swelling of the face, lips, or lymph nodes in your neck or under your arms. In some patients, this medication may cause a serious brain infection that may cause death. If you have any problems seeing, thinking, speaking, walking, or standing, tell your care team right away. If you cannot reach your care team, urgently seek another source of medical care. Talk to your care team if you may be pregnant. Serious birth defects can occur if you take this medication during pregnancy and for 12 months after the last dose.  You will need a negative pregnancy test before starting this medication. Contraception is recommended while taking this medication and for 12 months after the last dose. Your care team can help you find the option that works for you. Do not breastfeed while taking this medication and for at least 6 months after the last dose. What side effects may I notice from receiving this medication? Side effects that you should report to your care team as soon as possible: Allergic reactions or angioedema--skin rash, itching or hives, swelling of the face, eyes, lips, tongue, arms, or legs, trouble swallowing or breathing Bowel blockage--stomach cramping,  unable to have a bowel movement or pass gas, loss of appetite, vomiting Dizziness, loss of balance or coordination, confusion or trouble speaking Heart attack--pain or tightness in the chest, shoulders, arms, or jaw, nausea, shortness of breath, cold or clammy skin, feeling faint or lightheaded Heart rhythm changes--fast or irregular heartbeat, dizziness, feeling faint or lightheaded, chest pain, trouble breathing Infection--fever, chills, cough, sore throat, wounds that don't heal, pain or trouble when passing urine, general feeling of discomfort or being unwell Infusion reactions--chest pain, shortness of breath or trouble breathing, feeling faint or lightheaded Kidney injury--decrease in the amount of urine, swelling of the ankles, hands, or feet Liver injury--right upper belly pain, loss of appetite, nausea, light-colored stool, dark yellow or brown urine, yellowing skin or eyes, unusual weakness or fatigue Redness, blistering, peeling, or loosening of the skin, including inside the mouth Stomach pain that is severe, does not go away, or gets worse Tumor lysis syndrome (TLS)--nausea, vomiting, diarrhea, decrease in the amount of urine, dark urine, unusual weakness or fatigue, confusion, muscle pain or cramps, fast or irregular heartbeat, joint pain Side effects that usually do not require medical attention (report to your care team if they continue or are bothersome): Headache Joint pain Nausea Runny or stuffy nose Unusual weakness or fatigue This list may not describe all possible side effects. Call your doctor for medical advice about side effects. You may report side effects to FDA at 1-800-FDA-1088. Where should I keep my medication? This medication is given in a hospital or clinic. It will not be stored at home. NOTE: This sheet is a summary. It may not cover all possible information. If you have questions about this medicine, talk to your doctor, pharmacist, or health care provider.   2024 Elsevier/Gold Standard (2021-07-04 00:00:00)

## 2023-11-12 NOTE — Progress Notes (Signed)
 During rituximab  infuison (250 dose)  pt c/o feeling anxious and hot.  Pt appeared to be diaphoretic.  VSS.  Pt's wife stated this happens from time to time with patient.  He experiences panic attacks and this was discussed previously with Dr Babara.  BP rechecked 59/46.  Other VSS.  Dr Babara notified.  Approximately 10 minutes later, pt started feeling better.  BP 102/63.  Per Dr Babara, okay to administer ativan  and continue with rituximab  infusion.  Will continue to monitor.

## 2023-11-12 NOTE — Progress Notes (Signed)
 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 Patient is starting treatment with acalabrutinib  and rituximab  today 11/12/23.  Reason for Consult: Acalabrutinib  oral chemotherapy education.   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 Newly mildly enlarged 1.7 cm right pericardiophrenic node, indeterminate for metastatic disease. Suggest follow-up chest CT with IV contrast in 3 months. Alternatively, PET-CT could be considered at this time. 2. Lung-RADS 2, benign appearance or behavior. Continue annual screening with low-dose chest CT without contrast in 12 months. 3. Two-vessel coronary atherosclerosis. 4. Stable left adrenal adenoma, for which no follow-up imaging is recommended. 5. Aortic Atherosclerosis (ICD10-I70.0) and Emphysema (ICD10-J43.9).   08/03/2023 Imaging   PET scan showed Hypermetabolic mediastinal lymph nodes in the right cardiophrenic angle and retrosternal anterior mediastinum. Differential diagnosis includes lymphoproliferative disorder, inflammatory or  infectious etiologies, and less likely metastatic disease.   Stable 5 mm right upper lobe pulmonary nodule, without FDG uptake suggesting benign etiology. Recommend continued attention on follow-up imaging.   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   Pathology showed  A. PERICARDIAL FAT PAD NODULE, EXCISION:  -  Mantle cell lymphoma, see note.   B. PERICARDIAL FAT PAD NODULE, EXCISION:  -  Mantle cell lymphoma, see note.   C. LYMPH NODE, ANTERIOR MEDIASTINAL, EXCISION:  -  Mantle cell lymphoma, see note.   D. LYMPH NODES, ANTERIOR MEDIASTINAL #2, RESECTION:  -  Mantle cell lymphoma, see note.   The various lymph nodes present in parts a through D show a varied amount of architectural preservation with the obvious presence of retained secondary follicle/germinal centers with evidence of polarization and tingible body macrophages.  In addition, many of the lymph nodes show the preservation of open sinuses while other lymph nodes show loss of sinuses and evidence of extracapsular extension by small lymphocytes.  Overall there is a prominent nodular pattern, which depending on the node is secondary to prominent expansion of the mantle zone cuff up to including a partial diffuse infiltrate of the cortex and paracortex.  The majority of the cells are CD20 positive B cells which include both the cells involve and mantle zone expansion and focal diffuse infiltrate as well as those present in the reactive germinal centers (CD10/BCL6 positive and appropriately Bcl-2 negative CD23 highlights a follicular dendritic cell meshwork) the cells of interest in the expanded mantle zone cuffs and partial diffuse nature are weakly coexpressing CD5 as compared to the  CD3/CD43 positive T cells.  In addition they are also strongly positive for  nuclear expression of cyclin D1.  Ki-67 shows a normal immunoarchitecture in the areas of germinal centers and is overall less than 5% overall in the diffuse areas and expanded mantle cell cuffs.  Given the patchy nature of the disease the aforementioned immunohistochemical stains were performed on parts B, C and D.  In addition, flow cytometry, performed on part A, identifies 76% of the cells to be lambda restricted CD5 dim positive B cells.  These findings are consistent with mantle cell lymphoma.    10/06/2023 Cancer Staging   Staging form: Hodgkin and Non-Hodgkin Lymphoma, AJCC 8th Edition - Clinical stage from 10/06/2023: Stage II (Mantle cell lymphoma) - Signed by Babara Call, MD on 10/06/2023 Stage prefix: Initial diagnosis T(11;18)(q21;q21): Unknown HCV: Unknown   11/12/2023 -  Chemotherapy   Patient is on Treatment Plan : NON-HODGKINS LYMPHOMA Rituximab  q7d       ALLERGIES:  has no known allergies.  MEDICATIONS:  Current Outpatient Medications  Medication Sig Dispense Refill   acalabrutinib  maleate (CALQUENCE ) 100 MG tablet Take 1 tablet (100 mg total) by mouth 2 (two) times daily. 60 tablet 1   amLODipine  (NORVASC ) 5 MG tablet Take 1 tablet (5 mg total) by mouth daily. 30 tablet 3   aspirin  EC 81 MG EC tablet Take 1 tablet (81 mg total) by mouth daily. Swallow whole. 30 tablet 11   cetirizine (ZYRTEC) 10 MG tablet Take 10 mg by mouth daily.     ezetimibe  (ZETIA ) 10 MG tablet Take 1 tablet (10 mg total) by mouth daily. 90 tablet 3   NEXIUM 40 MG capsule Take 40 mg by mouth daily as needed (indigestion / heart burn).     pravastatin  (PRAVACHOL ) 20 MG tablet Take 1 tablet (20 mg total) by mouth daily.     No current facility-administered medications for this visit.   Facility-Administered Medications Ordered in Other Visits  Medication Dose Route Frequency Provider Last Rate Last Admin   0.9 %  sodium chloride  infusion   Intravenous Continuous Babara Call, MD 10 mL/hr at 11/12/23 0950  New Bag at 11/12/23 0950   LORazepam  (ATIVAN ) injection 0.5 mg  0.5 mg Intravenous PRN Yu, Zhou, MD   0.5 mg at 11/12/23 1324    VITAL SIGNS: There were no vitals taken for this visit. There were no vitals filed for this visit.  Estimated body mass index is 29.01 kg/m as calculated from the following:   Height as of 10/22/23: 6' 3 (1.905 m).   Weight as of an earlier encounter on 11/12/23: 105.3 kg (232 lb 1.6 oz).  LABS: CBC:    Component Value Date/Time   WBC 17.3 (H) 11/12/2023 0830   WBC 12.2 (H) 10/06/2023 1152   HGB 12.1 (L) 11/12/2023 0830   HCT 37.9 (L) 11/12/2023 0830   PLT 131 (L) 11/12/2023 0830   MCV 89.6 11/12/2023 0830   NEUTROABS 1.9 10/06/2023 1152   LYMPHSABS 9.2 (H) 10/06/2023 1152   MONOABS 0.7 10/06/2023 1152   EOSABS 0.2 10/06/2023 1152   BASOSABS 0.1 10/06/2023 1152   Comprehensive Metabolic Panel:    Component Value Date/Time   NA 137 11/12/2023 0831   NA 141 10/22/2023 0919   K 4.2 11/12/2023 0831   CL 107 11/12/2023 0831   CO2 23 11/12/2023 0831   BUN 15 11/12/2023 0831   BUN 16 10/22/2023 0919   CREATININE 1.16 11/12/2023 0831   GLUCOSE 147 (H) 11/12/2023  0831   CALCIUM  8.7 (L) 11/12/2023 0831   AST 27 11/12/2023 0831   ALT 21 11/12/2023 0831   ALKPHOS 93 11/12/2023 0831   BILITOT 0.9 11/12/2023 0831   PROT 7.1 11/12/2023 0831   PROT 7.1 10/22/2023 0919   ALBUMIN 3.9 11/12/2023 0831   ALBUMIN 4.4 10/22/2023 0919     Present during today's visit: patient and his wife, seen in infusion  Start plan: patient will start acalabrutinib  today 11/12/23   Patient Education I spoke with patient for overview of new oral chemotherapy medication: acalabrutinib    Treatment goal: Control  Administration: Counseled patient and his wife on administration, dosing, side effects, monitoring, drug-food interactions, safe handling, storage, and disposal. Patient will take 1 tablet (100 mg total) by mouth 2 (two) times daily.   Side Effects: Side  effects include but not limited to: headache, fatigue, decreased wbc/hgb/plt, headache, diarrhea, muscle pain.  Diarrhea: patient knows to use loperamide as needed and call the office if they are having four or more loose stool per day Headache: patient knows acalabrutinib  headaches respond well to caffeine, but to reach out to the office if this does not help resolve his headache   Drug-drug Interactions (DDI): Aspirin : Acalabrutinib  may increase antiplatelet effects of aspirin . Monitor for signs of bleeding. No baseline dose adjustment needed. Discussed with patient.   Adherence: After discussion with patient no patient barriers to medication adherence identified.  Reviewed with patient importance of keeping a medication schedule and plan for any missed doses.  Distress evaluation: Already completed in the during IV chemo education class  Communication and Learning Assessment Primary learner: Patient and his wife Barriers to learning: No barriers Preferred language: English Learning preferences: Listening Reading  [facility] voiced understanding and appreciation. All questions answered. Medication handout provided.  Provided patient with Oral Chemotherapy Navigation Clinic phone number. Patient knows to call the office with questions or concerns. Oral Chemotherapy Navigation Clinic will continue to follow.  Patient expressed understanding and was in agreement with this plan. He also understands that He can call clinic at any time with any questions, concerns, or complaints.   Medication Access Issues: No issue, patient fills at [facility] and has medication in hand  Follow-up plan: RTC as scheduled  Thank you for allowing me to participate in the care of this patient.   Time Total: 20 mins  Visit consisted of counseling and education on dealing with issues of symptom management in the setting of serious and potentially life-threatening illness.Greater than  50%  of this time was spent counseling and coordinating care related to the above assessment and plan.  Signed by: Fenix Ruppe N. Shahzain Kiester, PharmD, NEILA, CPP Hematology/Oncology Clinical Pharmacist Practitioner [facility] [phone]  11/12/2023 1:57 PM

## 2023-11-12 NOTE — Addendum Note (Signed)
 Addended by: BABARA CALL on: 11/12/2023 01:21 PM   Modules accepted: Orders

## 2023-11-12 NOTE — Assessment & Plan Note (Signed)
 Stable hemoglobin level. Monitor.

## 2023-11-12 NOTE — Progress Notes (Addendum)
 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  Will further discuss with pathologist  and add on prognostic factors including IGHV, SOX11, TP53     Normal LDH. MIPI score is 6.5,  high risk. Negative hepatitis and HIV. TP53 negative.  He does not have significant GI symptoms except chronic acid reflux. He defers EGD and colonoscopy to rule out occult GI MCL.  Patient has high MIPI score, low Ki67. Mantle cell lymphoma involving pericardia fat and mediastinal lymph nodes.  I recommend chemotherapy treatment with Acalabrutinib  plus Rituximab .  Rationale and side effects were reviewed with patient and wife. They agree with treatments.  Labs are reviewed and discussed with patient. Proceed with Rituximab  today. He will start Acalabrutinib  today.   Normocytic anemia Stable hemoglobin level. Monitor.   Thrombocytopenia (HCC) Mild.  Monitor.  Check B12 and folate.  Anxiety Patient will get Ativan  o.5mg  IV PRN anxiety prior to chemo.   He declines influenza vaccination No orders of the defined types were placed in this encounter.  Follow up in 1 week All questions were answered. The patient knows to call the clinic with any problems, questions or concerns.  Zelphia Cap, MD, PhD [facility] Hematology Oncology 11/12/2023    HISTORY OF PRESENTING ILLNESS:  Blake Padilla 73 y.o. male presents to establish care for mantle cell lymphoma I have reviewed his chart and materials related to his cancer extensively and collaborated history with the patient. Summary of oncologic history is as follows: Oncology History  Mantle cell lymphoma of intrathoracic lymph nodes  (HCC)  06/09/2023 Imaging   CT chest lung cancer screening showed 1. Newly mildly enlarged 1.7 cm right pericardiophrenic node, indeterminate for metastatic disease. Suggest follow-up chest CT with IV contrast in 3 months. Alternatively, PET-CT could be considered at this time. 2. Lung-RADS 2, benign appearance or behavior. Continue annual screening with low-dose chest CT without contrast in 12 months. 3. Two-vessel coronary atherosclerosis. 4. Stable left adrenal adenoma, for which no follow-up imaging is recommended. 5. Aortic Atherosclerosis (ICD10-I70.0) and Emphysema (ICD10-J43.9).   08/03/2023 Imaging   PET scan showed Hypermetabolic mediastinal lymph nodes in the right cardiophrenic angle and retrosternal anterior mediastinum. Differential diagnosis includes lymphoproliferative disorder, inflammatory or infectious etiologies, and less likely metastatic disease.   Stable 5 mm right upper lobe pulmonary nodule, without FDG uptake suggesting benign etiology. Recommend continued attention on follow-up imaging.   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   Pathology showed  A. PERICARDIAL FAT PAD NODULE, EXCISION:  -  Mantle cell lymphoma, see note.   B. PERICARDIAL FAT PAD NODULE, EXCISION:  -  Mantle cell lymphoma, see note.   C. LYMPH NODE, ANTERIOR MEDIASTINAL, EXCISION:  -  Mantle cell lymphoma, see note.   D. LYMPH NODES, ANTERIOR MEDIASTINAL #2, RESECTION:  -  Mantle cell lymphoma, see note.   The various lymph nodes present in parts a through D show a varied amount of architectural preservation with the obvious presence of retained secondary follicle/germinal centers with evidence of polarization and tingible body macrophages.  In addition, many of  the lymph nodes show the preservation of open sinuses  while other lymph nodes show loss of sinuses and evidence of extracapsular extension by small lymphocytes.  Overall there is a prominent nodular pattern, which depending on the node is secondary to prominent expansion of the mantle zone cuff up to including a partial diffuse infiltrate of the cortex and paracortex.  The majority of the cells are CD20 positive B cells which include both the cells involve and mantle zone expansion and focal diffuse infiltrate as well as those present in the reactive germinal centers (CD10/BCL6 positive and appropriately Bcl-2 negative CD23 highlights a follicular dendritic cell meshwork) the cells of interest in the expanded mantle zone cuffs and partial diffuse nature are weakly coexpressing CD5 as compared to the CD3/CD43 positive T cells.  In addition they are also strongly positive for nuclear expression of cyclin D1.  Ki-67 shows a normal immunoarchitecture in the areas of germinal centers and is overall less than 5% overall in the diffuse areas and expanded mantle cell cuffs.  Given the patchy nature of the disease the aforementioned immunohistochemical stains were performed on parts B, C and D.  In addition, flow cytometry, performed on part A, identifies 76% of the cells to be lambda restricted CD5 dim positive B cells.  These findings are consistent with mantle cell lymphoma.    10/06/2023 Cancer Staging   Staging form: Hodgkin and Non-Hodgkin Lymphoma, AJCC 8th Edition - Clinical stage from 10/06/2023: Stage II (Mantle cell lymphoma) - Signed by Babara Call, MD on 10/06/2023 Stage prefix: Initial diagnosis T(11;18)(q21;q21): Unknown HCV: Unknown   11/12/2023 -  Chemotherapy   Patient is on Treatment Plan : NON-HODGKINS LYMPHOMA Rituximab  q7d      Patient has history of COPD, mild shortness of breath with exertion..  he remains active for his age.  He plays golf 3-4 times a week.  Former smoker, quit in October 2022. Patient denies any GI symptoms except chronic  acid reflux which is controlled with PPI.  Denies any unintentional weight loss, night sweats or fever.  Patient presents for evaluation prior to chemotherapy. He is accompanied. He has no new concerns.   MEDICAL HISTORY:  Past Medical History:  Diagnosis Date   Anxiety    in small places   Arthritis    COPD (chronic obstructive pulmonary disease) (HCC)    Pt denies   Coronary artery disease    GERD (gastroesophageal reflux disease)    Hypertension    Iliac aneurysm (HCC) 01/16/2021   Panic attacks    Peripheral vascular disease (HCC)    Iliac Artery Aneurysm Repair    SURGICAL HISTORY: Past Surgical History:  Procedure Laterality Date   CATARACT EXTRACTION W/ INTRAOCULAR LENS IMPLANT Right    ENDOVASCULAR REPAIR/STENT GRAFT N/A 01/16/2021   Procedure: ENDOVASCULAR REPAIR/STENT GRAFT;  Surgeon: Marea Selinda RAMAN, MD;  Location: [facility];  Service: Cardiovascular;  Laterality: N/A;   EXCISION, MASS, MEDIASTINUM, ROBOT-ASSISTED Right 09/14/2023   Procedure: EXCISION, MASS, MEDIASTINUM, ROBOT-ASSISTED;  Surgeon: Kerrin Elspeth BROCKS, MD;  Location: [facility] OR;  Service: Thoracic;  Laterality: Right;  RIGHT ROBOTIC RESECTION OF PERICARDIAL FAT PAD AND MEDIASTINAL LYMPH NODES   ILIAC ARTERY ANEURYSM REPAIR  01/16/2021   INTERCOSTAL NERVE BLOCK N/A 09/14/2023   Procedure: BLOCK, NERVE, INTERCOSTAL;  Surgeon: Kerrin Elspeth BROCKS, MD;  Location: [facility] OR;  Service: Thoracic;  Laterality: N/A;   TOTAL HIP ARTHROPLASTY Left 06/11/2021   Procedure: TOTAL HIP ARTHROPLASTY ANTERIOR APPROACH;  Surgeon: Kathlynn Sharper, MD;  Location: [facility] ORS;  Service: Orthopedics;  Laterality: Left;    SOCIAL HISTORY: Social History   Socioeconomic History   Marital status: Married    Spouse name: Susie   Number of children: 1   Years of education: Not on file   Highest education level: Not on file  Occupational History   Not on file  Tobacco Use   Smoking status: Former    Current packs/day: 0.00     Average packs/day: 1 pack/day for 52.8 years (52.8 ttl pk-yrs)    Types: Cigarettes    Start date: 55    Quit date: 12/13/2021    Years since quitting: 1.9   Smokeless tobacco: Never   Tobacco comments:    Quit smoking 11/2021  Vaping Use   Vaping status: Never Used  Substance and Sexual Activity   Alcohol use: Not Currently    Comment: with colds   Drug use: Never   Sexual activity: Yes  Other Topics Concern   Not on file  Social History Narrative   Not on file   Social Drivers of Health   Financial Resource Strain: Low Risk  (10/06/2023)   Overall Financial Resource Strain (CARDIA)    Difficulty of Paying Living Expenses: Not very hard  Food Insecurity: No Food Insecurity (10/06/2023)   Hunger Vital Sign    Worried About Running Out of Food in the Last Year: Never true    Ran Out of Food in the Last Year: Never true  Transportation Needs: No Transportation Needs (10/06/2023)   PRAPARE - Administrator, Civil Service (Medical): No    Lack of Transportation (Non-Medical): No  Physical Activity: Not on file  Stress: No Stress Concern Present (10/06/2023)   [employer] of Occupational Health - Occupational Stress Questionnaire    Feeling of Stress: Not at all  Social Connections: Socially Integrated (09/14/2023)   Social Connection and Isolation Panel    Frequency of Communication with Friends and Family: Three times a week    Frequency of Social Gatherings with Friends and Family: Once a week    Attends Religious Services: More than 4 times per year    Active Member of [employer] or Organizations: Yes    Attends Banker Meetings: 1 to 4 times per year    Marital Status: Married  Catering manager Violence: Not At Risk (10/06/2023)   Humiliation, Afraid, Rape, and Kick questionnaire    Fear of Current or Ex-Partner: No    Emotionally Abused: No    Physically Abused: No    Sexually Abused: No    FAMILY HISTORY: Family History  Problem  Relation Age of Onset   Diabetes Mother    Hypertension Mother    Diabetes Father    Hypertension Father    Leukemia Sister    Diabetes Brother    Dementia Sister    COPD Sister    Healthy Sister     ALLERGIES:  has no known allergies.  MEDICATIONS:  Current Outpatient Medications  Medication Sig Dispense Refill   acalabrutinib  maleate (CALQUENCE ) 100 MG tablet Take 1 tablet (100 mg total) by mouth 2 (two) times daily. 60 tablet 1   amLODipine  (NORVASC ) 5 MG tablet Take 1 tablet (5 mg total) by mouth daily. 30 tablet 3   aspirin  EC 81 MG EC tablet Take 1 tablet (81 mg total) by mouth daily. Swallow whole. 30 tablet 11   cetirizine (ZYRTEC) 10 MG tablet Take 10 mg by mouth daily.  ezetimibe  (ZETIA ) 10 MG tablet Take 1 tablet (10 mg total) by mouth daily. 90 tablet 3   NEXIUM 40 MG capsule Take 40 mg by mouth daily as needed (indigestion / heart burn).     pravastatin  (PRAVACHOL ) 20 MG tablet Take 1 tablet (20 mg total) by mouth daily.     No current facility-administered medications for this visit.   Facility-Administered Medications Ordered in Other Visits  Medication Dose Route Frequency Provider Last Rate Last Admin   0.9 %  sodium chloride  infusion   Intravenous Continuous Babara Call, MD 10 mL/hr at 11/12/23 0950 New Bag at 11/12/23 0950    Review of Systems  Constitutional:  Negative for appetite change, chills, fatigue, fever and unexpected weight change.  HENT:   Negative for hearing loss and voice change.   Eyes:  Negative for eye problems and icterus.  Respiratory:  Negative for chest tightness, cough and shortness of breath.   Cardiovascular:  Negative for chest pain and leg swelling.  Gastrointestinal:  Negative for abdominal distention and abdominal pain.  Endocrine: Negative for hot flashes.  Genitourinary:  Negative for difficulty urinating, dysuria and frequency.   Musculoskeletal:  Negative for arthralgias.  Skin:  Negative for itching and rash.   Neurological:  Negative for light-headedness and numbness.  Hematological:  Negative for adenopathy. Does not bruise/bleed easily.  Psychiatric/Behavioral:  Negative for confusion.      PHYSICAL EXAMINATION: ECOG PERFORMANCE STATUS: 1 - Symptomatic but completely ambulatory  Vitals:   11/12/23 0902  BP: 137/77  Pulse: 78  Resp: 17  Temp: 97.6 F (36.4 C)  SpO2: 97%   Filed Weights   11/12/23 0902  Weight: 232 lb 1.6 oz (105.3 kg)    Physical Exam Constitutional:      General: He is not in acute distress.    Appearance: He is not diaphoretic.  HENT:     Head: Normocephalic and atraumatic.     Nose: Nose normal.  Eyes:     General: No scleral icterus.    Pupils: Pupils are equal, round, and reactive to light.  Cardiovascular:     Rate and Rhythm: Normal rate and regular rhythm.     Heart sounds: No murmur heard. Pulmonary:     Effort: Pulmonary effort is normal. No respiratory distress.     Breath sounds: No wheezing.  Abdominal:     General: There is no distension.     Palpations: Abdomen is soft.     Tenderness: There is no abdominal tenderness.  Musculoskeletal:        General: Normal range of motion.     Cervical back: Normal range of motion and neck supple.  Skin:    General: Skin is warm and dry.     Findings: No erythema.  Neurological:     Mental Status: He is alert and oriented to person, place, and time. Mental status is at baseline.     Cranial Nerves: No cranial nerve deficit.     Motor: No abnormal muscle tone.  Psychiatric:        Mood and Affect: Mood and affect normal.      LABORATORY DATA:  I have reviewed the data as listed    Latest Ref Rng & Units 11/12/2023    8:30 AM 10/06/2023   11:52 AM 09/15/2023    3:00 AM  CBC  WBC 4.0 - 10.5 K/uL 17.3  12.2  16.5   Hemoglobin 13.0 - 17.0 g/dL 87.8  87.2  88.8  Hematocrit 39.0 - 52.0 % 37.9  39.0  34.7   Platelets 150 - 400 K/uL 131  146  130       Latest Ref Rng & Units 11/12/2023     8:31 AM 10/22/2023    9:19 AM 09/15/2023    3:00 AM  CMP  Glucose 70 - 99 mg/dL 852  883  861   BUN 8 - 23 mg/dL 15  16  19    Creatinine 0.61 - 1.24 mg/dL 8.83  8.84  8.64   Sodium 135 - 145 mmol/L 137  141  137   Potassium 3.5 - 5.1 mmol/L 4.2  4.1  4.5   Chloride 98 - 111 mmol/L 107  106  109   CO2 22 - 32 mmol/L 23  22  22    Calcium  8.9 - 10.3 mg/dL 8.7  9.1  8.4   Total Protein 6.5 - 8.1 g/dL 7.1  7.1    Total Bilirubin 0.0 - 1.2 mg/dL 0.9  0.7    Alkaline Phos 38 - 126 U/L 93  126    AST 15 - 41 U/L 27  24    ALT 0 - 44 U/L 21  14       RADIOGRAPHIC STUDIES: I have personally reviewed the radiological images as listed and agreed with the findings in the report. No results found.

## 2023-11-12 NOTE — Assessment & Plan Note (Signed)
 Mild.  Monitor.  Check B12 and folate.

## 2023-11-12 NOTE — Assessment & Plan Note (Addendum)
 PET scan and biopsy pathology results reviewed and discussed with patient. 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  Will further discuss with pathologist  and add on prognostic factors including IGHV, SOX11, TP53     Normal LDH. MIPI score is 6.5,  high risk. Negative hepatitis and HIV. TP53 negative.  He does not have significant GI symptoms except chronic acid reflux. He defers EGD and colonoscopy to rule out occult GI MCL.  Patient has high MIPI score, low Ki67. Mantle cell lymphoma involving pericardia fat and mediastinal lymph nodes.  I recommend chemotherapy treatment with Acalabrutinib  plus Rituximab .  Rationale and side effects were reviewed with patient and wife. They agree with treatments.  Labs are reviewed and discussed with patient. Proceed with Rituximab  today. He will start Acalabrutinib  today.  Recommend dermatology evaluation due to increased skin cancer risk.

## 2023-11-16 ENCOUNTER — Telehealth: Payer: Self-pay

## 2023-11-16 NOTE — Telephone Encounter (Signed)
Telephone call to patient for follow up after receiving first infusion.   No answer but left message stating we were calling to check on them.  Encouraged patient to call for any questions or concerns.   

## 2023-11-18 ENCOUNTER — Other Ambulatory Visit: Payer: Self-pay | Admitting: *Deleted

## 2023-11-18 DIAGNOSIS — C8312 Mantle cell lymphoma, intrathoracic lymph nodes: Secondary | ICD-10-CM

## 2023-11-19 ENCOUNTER — Inpatient Hospital Stay

## 2023-11-19 ENCOUNTER — Encounter: Payer: Self-pay | Admitting: Oncology

## 2023-11-19 ENCOUNTER — Inpatient Hospital Stay: Admitting: Oncology

## 2023-11-19 VITALS — BP 122/68 | HR 82 | Resp 18

## 2023-11-19 VITALS — BP 139/82 | HR 89 | Temp 96.4°F | Resp 18 | Wt 234.7 lb

## 2023-11-19 DIAGNOSIS — F419 Anxiety disorder, unspecified: Secondary | ICD-10-CM

## 2023-11-19 DIAGNOSIS — C8312 Mantle cell lymphoma, intrathoracic lymph nodes: Secondary | ICD-10-CM

## 2023-11-19 DIAGNOSIS — D696 Thrombocytopenia, unspecified: Secondary | ICD-10-CM | POA: Diagnosis not present

## 2023-11-19 DIAGNOSIS — Z5112 Encounter for antineoplastic immunotherapy: Secondary | ICD-10-CM | POA: Diagnosis not present

## 2023-11-19 DIAGNOSIS — D649 Anemia, unspecified: Secondary | ICD-10-CM

## 2023-11-19 LAB — CBC WITH DIFFERENTIAL (CANCER CENTER ONLY)
Abs Immature Granulocytes: 0.04 K/uL (ref 0.00–0.07)
Basophils Absolute: 0 K/uL (ref 0.0–0.1)
Basophils Relative: 0 %
Eosinophils Absolute: 0.1 K/uL (ref 0.0–0.5)
Eosinophils Relative: 1 %
HCT: 35.8 % — ABNORMAL LOW (ref 39.0–52.0)
Hemoglobin: 11.7 g/dL — ABNORMAL LOW (ref 13.0–17.0)
Immature Granulocytes: 0 %
Lymphocytes Relative: 62 %
Lymphs Abs: 7.5 K/uL — ABNORMAL HIGH (ref 0.7–4.0)
MCH: 28.7 pg (ref 26.0–34.0)
MCHC: 32.7 g/dL (ref 30.0–36.0)
MCV: 88 fL (ref 80.0–100.0)
Monocytes Absolute: 0.9 K/uL (ref 0.1–1.0)
Monocytes Relative: 7 %
Neutro Abs: 3.6 K/uL (ref 1.7–7.7)
Neutrophils Relative %: 30 %
Platelet Count: 124 K/uL — ABNORMAL LOW (ref 150–400)
RBC: 4.07 MIL/uL — ABNORMAL LOW (ref 4.22–5.81)
RDW: 14.4 % (ref 11.5–15.5)
Smear Review: NORMAL
WBC Count: 12.1 K/uL — ABNORMAL HIGH (ref 4.0–10.5)
nRBC: 0 % (ref 0.0–0.2)

## 2023-11-19 LAB — CMP (CANCER CENTER ONLY)
ALT: 14 U/L (ref 0–44)
AST: 18 U/L (ref 15–41)
Albumin: 3.8 g/dL (ref 3.5–5.0)
Alkaline Phosphatase: 86 U/L (ref 38–126)
Anion gap: 8 (ref 5–15)
BUN: 19 mg/dL (ref 8–23)
CO2: 22 mmol/L (ref 22–32)
Calcium: 8.7 mg/dL — ABNORMAL LOW (ref 8.9–10.3)
Chloride: 108 mmol/L (ref 98–111)
Creatinine: 1.03 mg/dL (ref 0.61–1.24)
GFR, Estimated: >60 mL/min (ref >60)
Glucose, Bld: 145 mg/dL — ABNORMAL HIGH (ref 70–99)
Potassium: 3.9 mmol/L (ref 3.5–5.1)
Sodium: 138 mmol/L (ref 135–145)
Total Bilirubin: 1.1 mg/dL (ref 0.0–1.2)
Total Protein: 7 g/dL (ref 6.5–8.1)

## 2023-11-19 MED ORDER — ACETAMINOPHEN 325 MG PO TABS
650.0000 mg | ORAL_TABLET | Freq: Once | ORAL | Status: AC
  Administered 2023-11-19: 650 mg via ORAL
  Filled 2023-11-19: qty 2

## 2023-11-19 MED ORDER — SODIUM CHLORIDE 0.9 % IV SOLN
375.0000 mg/m2 | Freq: Once | INTRAVENOUS | Status: AC
  Administered 2023-11-19: 900 mg via INTRAVENOUS
  Filled 2023-11-19: qty 40

## 2023-11-19 MED ORDER — LORAZEPAM 2 MG/ML IJ SOLN
0.5000 mg | INTRAMUSCULAR | Status: DC | PRN
  Administered 2023-11-19: 0.5 mg via INTRAVENOUS
  Filled 2023-11-19: qty 1

## 2023-11-19 MED ORDER — DIPHENHYDRAMINE HCL 25 MG PO CAPS
50.0000 mg | ORAL_CAPSULE | Freq: Once | ORAL | Status: AC
  Administered 2023-11-19: 50 mg via ORAL
  Filled 2023-11-19: qty 2

## 2023-11-19 MED ORDER — SODIUM CHLORIDE 0.9 % IV SOLN
INTRAVENOUS | Status: DC
  Filled 2023-11-19: qty 250

## 2023-11-19 NOTE — Assessment & Plan Note (Signed)
 Patient will get Ativan  o.5mg  IV PRN anxiety prior to chemo.

## 2023-11-19 NOTE — Progress Notes (Signed)
 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  Will further discuss with pathologist  and add on prognostic factors including IGHV, SOX11, TP53     Normal LDH. MIPI score is 6.5,  high risk. Negative hepatitis and HIV. TP53 negative.  He does not have significant GI symptoms except chronic acid reflux. He defers EGD and colonoscopy to rule out occult GI MCL.  Patient has high MIPI score, low Ki67. Mantle cell lymphoma involving pericardia fat and mediastinal lymph nodes.  I recommend chemotherapy treatment with Acalabrutinib  plus Rituximab .   Labs are reviewed and discussed with patient. Proceed with Rituximab  today. Continue Acalabrutinib  100mg  BID today.  He tolerates well.    Anxiety Patient will get Ativan  o.5mg  IV PRN anxiety prior to chemo.   Normocytic anemia Stable hemoglobin level. Monitor.   Thrombocytopenia Mild.  Monitor.  Check B12 and folate.  He declines influenza vaccination Orders Placed This Encounter  Procedures   CBC (Cancer Center Only)    Standing Status:   Future    Expected Date:   12/03/2023    Expiration Date:   12/02/2024   CMP (Cancer Center only)    Standing Status:   Future    Expected Date:   12/03/2023    Expiration Date:   12/02/2024   CBC with Differential (Cancer Center Only)    Standing Status:   Future    Expected Date:   12/17/2023    Expiration Date:   03/16/2024   CMP (Cancer Center only)    Standing Status:   Future    Expected Date:   12/17/2023    Expiration Date:   03/16/2024   Vitamin B12    Standing Status:   Future    Expected Date:   12/03/2023    Expiration Date:   12/02/2024   Folate    Standing  Status:   Future    Expected Date:   12/03/2023    Expiration Date:   12/02/2024   Follow up in 1 week All questions were answered. The patient knows to call the clinic with any problems, questions or concerns.  Zelphia Cap, MD, PhD [facility] Hematology Oncology 11/19/2023    HISTORY OF PRESENTING ILLNESS:  Blake Padilla 73 y.o. male presents to establish care for mantle cell lymphoma I have reviewed his chart and materials related to his cancer extensively and collaborated history with the patient. 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 Newly mildly enlarged 1.7 cm right pericardiophrenic node, indeterminate for metastatic disease. Suggest follow-up chest CT with IV contrast in 3 months. Alternatively, PET-CT could be considered at this time. 2. Lung-RADS 2, benign appearance or behavior. Continue annual screening with low-dose chest CT without contrast in 12 months. 3. Two-vessel coronary atherosclerosis. 4. Stable left adrenal adenoma, for which no follow-up imaging is recommended. 5. Aortic Atherosclerosis (ICD10-I70.0) and Emphysema (ICD10-J43.9).   08/03/2023 Imaging   PET scan showed Hypermetabolic mediastinal lymph nodes in the right cardiophrenic angle and retrosternal anterior mediastinum. Differential diagnosis includes lymphoproliferative disorder, inflammatory or infectious etiologies, and less likely metastatic disease.  Stable 5 mm right upper lobe pulmonary nodule, without FDG uptake suggesting benign etiology. Recommend continued attention on follow-up imaging.   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   Pathology showed  A. PERICARDIAL FAT PAD  NODULE, EXCISION:  -  Mantle cell lymphoma, see note.   B. PERICARDIAL FAT PAD NODULE, EXCISION:  -  Mantle cell lymphoma, see note.   C. LYMPH NODE, ANTERIOR MEDIASTINAL, EXCISION:  -  Mantle cell lymphoma, see note.   D. LYMPH NODES, ANTERIOR MEDIASTINAL #2, RESECTION:  -  Mantle cell lymphoma, see note.   The various lymph nodes present in parts a through D show a varied amount of architectural preservation with the obvious presence of retained secondary follicle/germinal centers with evidence of polarization and tingible body macrophages.  In addition, many of the lymph nodes show the preservation of open sinuses while other lymph nodes show loss of sinuses and evidence of extracapsular extension by small lymphocytes.  Overall there is a prominent nodular pattern, which depending on the node is secondary to prominent expansion of the mantle zone cuff up to including a partial diffuse infiltrate of the cortex and paracortex.  The majority of the cells are CD20 positive B cells which include both the cells involve and mantle zone expansion and focal diffuse infiltrate as well as those present in the reactive germinal centers (CD10/BCL6 positive and appropriately Bcl-2 negative CD23 highlights a follicular dendritic cell meshwork) the cells of interest in the expanded mantle zone cuffs and partial diffuse nature are weakly coexpressing CD5 as compared to the CD3/CD43 positive T cells.  In addition they are also strongly positive for nuclear expression of cyclin D1.  Ki-67 shows a normal immunoarchitecture in the areas of germinal centers and is overall less than 5% overall in the diffuse areas and expanded mantle cell cuffs.  Given the patchy nature of the disease the aforementioned immunohistochemical stains were performed on parts B, C and D.  In addition, flow cytometry, performed on part A, identifies 76% of the cells to be lambda restricted CD5 dim positive B cells.  These findings are  consistent with mantle cell lymphoma.    10/06/2023 Cancer Staging   Staging form: Hodgkin and Non-Hodgkin Lymphoma, AJCC 8th Edition - Clinical stage from 10/06/2023: Stage II (Mantle cell lymphoma) - Signed by Babara Call, MD on 10/06/2023 Stage prefix: Initial diagnosis T(11;18)(q21;q21): Unknown HCV: Unknown   11/12/2023 -  Chemotherapy   Acalabrutinib  100 mg orally twice a day and intravenous rituximab , planweekly for first 4 weeks, followed by once a month for 12 months and subsequently once every 2 months totaling 24 months.     Patient has history of COPD, mild shortness of breath with exertion..  he remains active for his age.  He plays golf 3-4 times a week.  Former smoker, quit in October 2022. Patient denies any GI symptoms except chronic acid reflux which is controlled with PPI.  Denies any unintentional weight loss, night sweats or fever.  Patient presents for evaluation prior to chemotherapy. He is accompanied by wife. He tolerated Rituximab  and acalabrutinib  well.  He has no new concerns.   MEDICAL HISTORY:  Past Medical History:  Diagnosis Date   Anxiety    in small places   Arthritis    COPD (chronic obstructive pulmonary disease) (HCC)    Pt denies   Coronary  artery disease    GERD (gastroesophageal reflux disease)    Hypertension    Iliac aneurysm 01/16/2021   Panic attacks    Peripheral vascular disease    Iliac Artery Aneurysm Repair    SURGICAL HISTORY: Past Surgical History:  Procedure Laterality Date   CATARACT EXTRACTION W/ INTRAOCULAR LENS IMPLANT Right    ENDOVASCULAR STENT GRAFT (AAA) N/A 01/16/2021   Procedure: ENDOVASCULAR REPAIR/STENT GRAFT;  Surgeon: Marea Selinda RAMAN, MD;  Location: [facility];  Service: Cardiovascular;  Laterality: N/A;   EXCISION, MASS, MEDIASTINUM, ROBOT-ASSISTED Right 09/14/2023   Procedure: EXCISION, MASS, MEDIASTINUM, ROBOT-ASSISTED;  Surgeon: Kerrin Elspeth BROCKS, MD;  Location: [facility] OR;  Service: Thoracic;  Laterality:  Right;  RIGHT ROBOTIC RESECTION OF PERICARDIAL FAT PAD AND MEDIASTINAL LYMPH NODES   ILIAC ARTERY ANEURYSM REPAIR  01/16/2021   INTERCOSTAL NERVE BLOCK N/A 09/14/2023   Procedure: BLOCK, NERVE, INTERCOSTAL;  Surgeon: Kerrin Elspeth BROCKS, MD;  Location: [facility] OR;  Service: Thoracic;  Laterality: N/A;   TOTAL HIP ARTHROPLASTY Left 06/11/2021   Procedure: TOTAL HIP ARTHROPLASTY ANTERIOR APPROACH;  Surgeon: Kathlynn Sharper, MD;  Location: [facility] ORS;  Service: Orthopedics;  Laterality: Left;    SOCIAL HISTORY: Social History   Socioeconomic History   Marital status: Married    Spouse name: Susie   Number of children: 1   Years of education: Not on file   Highest education level: Not on file  Occupational History   Not on file  Tobacco Use   Smoking status: Former    Current packs/day: 0.00    Average packs/day: 1 pack/day for 52.8 years (52.8 ttl pk-yrs)    Types: Cigarettes    Start date: 74    Quit date: 12/13/2021    Years since quitting: 1.9   Smokeless tobacco: Never   Tobacco comments:    Quit smoking 11/2021  Vaping Use   Vaping status: Never Used  Substance and Sexual Activity   Alcohol use: Not Currently    Comment: with colds   Drug use: Never   Sexual activity: Yes  Other Topics Concern   Not on file  Social History Narrative   Not on file   Social Drivers of Health   Financial Resource Strain: Low Risk  (10/06/2023)   Overall Financial Resource Strain (CARDIA)    Difficulty of Paying Living Expenses: Not very hard  Food Insecurity: No Food Insecurity (10/06/2023)   Hunger Vital Sign    Worried About Running Out of Food in the Last Year: Never true    Ran Out of Food in the Last Year: Never true  Transportation Needs: No Transportation Needs (10/06/2023)   PRAPARE - Administrator, Civil Service (Medical): No    Lack of Transportation (Non-Medical): No  Physical Activity: Not on file  Stress: No Stress Concern Present (10/06/2023)   [employer]  of Occupational Health - Occupational Stress Questionnaire    Feeling of Stress: Not at all  Social Connections: Socially Integrated (09/14/2023)   Social Connection and Isolation Panel    Frequency of Communication with Friends and Family: Three times a week    Frequency of Social Gatherings with Friends and Family: Once a week    Attends Religious Services: More than 4 times per year    Active Member of [employer] or Organizations: Yes    Attends Banker Meetings: 1 to 4 times per year    Marital Status: Married  Catering manager Violence: Not At Risk (10/06/2023)  Humiliation, Afraid, Rape, and Kick questionnaire    Fear of Current or Ex-Partner: No    Emotionally Abused: No    Physically Abused: No    Sexually Abused: No    FAMILY HISTORY: Family History  Problem Relation Age of Onset   Diabetes Mother    Hypertension Mother    Diabetes Father    Hypertension Father    Leukemia Sister    Diabetes Brother    Dementia Sister    COPD Sister    Healthy Sister     ALLERGIES:  has no known allergies.  MEDICATIONS:  Current Outpatient Medications  Medication Sig Dispense Refill   acalabrutinib  maleate (CALQUENCE ) 100 MG tablet Take 1 tablet (100 mg total) by mouth 2 (two) times daily. 60 tablet 1   amLODipine  (NORVASC ) 5 MG tablet Take 1 tablet (5 mg total) by mouth daily. 30 tablet 3   aspirin  EC 81 MG EC tablet Take 1 tablet (81 mg total) by mouth daily. Swallow whole. 30 tablet 11   cetirizine (ZYRTEC) 10 MG tablet Take 10 mg by mouth daily.     ezetimibe  (ZETIA ) 10 MG tablet Take 1 tablet (10 mg total) by mouth daily. 90 tablet 3   NEXIUM 40 MG capsule Take 40 mg by mouth daily as needed (indigestion / heart burn).     pravastatin  (PRAVACHOL ) 20 MG tablet Take 1 tablet (20 mg total) by mouth daily.     No current facility-administered medications for this visit.   Facility-Administered Medications Ordered in Other Visits  Medication Dose Route Frequency  Provider Last Rate Last Admin   0.9 %  sodium chloride  infusion   Intravenous Continuous Babara Call, MD   Stopped at 11/19/23 1405   LORazepam  (ATIVAN ) injection 0.5 mg  0.5 mg Intravenous PRN Tiena Manansala, MD   0.5 mg at 11/19/23 9057    Review of Systems  Constitutional:  Negative for appetite change, chills, fatigue, fever and unexpected weight change.  HENT:   Negative for hearing loss and voice change.   Eyes:  Negative for eye problems and icterus.  Respiratory:  Negative for chest tightness, cough and shortness of breath.   Cardiovascular:  Negative for chest pain and leg swelling.  Gastrointestinal:  Negative for abdominal distention and abdominal pain.  Endocrine: Negative for hot flashes.  Genitourinary:  Negative for difficulty urinating, dysuria and frequency.   Musculoskeletal:  Negative for arthralgias.  Skin:  Negative for itching and rash.  Neurological:  Negative for light-headedness and numbness.  Hematological:  Negative for adenopathy. Does not bruise/bleed easily.  Psychiatric/Behavioral:  Negative for confusion.      PHYSICAL EXAMINATION: ECOG PERFORMANCE STATUS: 1 - Symptomatic but completely ambulatory  Vitals:   11/19/23 0852  BP: 139/82  Pulse: 89  Resp: 18  Temp: (!) 96.4 F (35.8 C)  SpO2: 97%   Filed Weights   11/19/23 0852  Weight: 234 lb 11.2 oz (106.5 kg)    Physical Exam Constitutional:      General: He is not in acute distress.    Appearance: He is not diaphoretic.  HENT:     Head: Normocephalic and atraumatic.     Nose: Nose normal.  Eyes:     General: No scleral icterus.    Pupils: Pupils are equal, round, and reactive to light.  Cardiovascular:     Rate and Rhythm: Normal rate and regular rhythm.     Heart sounds: No murmur heard. Pulmonary:     Effort: Pulmonary effort  is normal. No respiratory distress.     Breath sounds: No wheezing.  Abdominal:     General: There is no distension.     Palpations: Abdomen is soft.      Tenderness: There is no abdominal tenderness.  Musculoskeletal:        General: Normal range of motion.     Cervical back: Normal range of motion and neck supple.  Skin:    General: Skin is warm and dry.     Findings: No erythema.  Neurological:     Mental Status: He is alert and oriented to person, place, and time. Mental status is at baseline.     Cranial Nerves: No cranial nerve deficit.     Motor: No abnormal muscle tone.  Psychiatric:        Mood and Affect: Mood and affect normal.      LABORATORY DATA:  I have reviewed the data as listed    Latest Ref Rng & Units 11/19/2023    8:34 AM 11/12/2023    8:30 AM 10/06/2023   11:52 AM  CBC  WBC 4.0 - 10.5 K/uL 12.1  17.3  12.2   Hemoglobin 13.0 - 17.0 g/dL 88.2  87.8  87.2   Hematocrit 39.0 - 52.0 % 35.8  37.9  39.0   Platelets 150 - 400 K/uL 124  131  146       Latest Ref Rng & Units 11/19/2023    8:34 AM 11/12/2023    8:31 AM 10/22/2023    9:19 AM  CMP  Glucose 70 - 99 mg/dL 854  852  883   BUN 8 - 23 mg/dL 19  15  16    Creatinine 0.61 - 1.24 mg/dL 8.96  8.83  8.84   Sodium 135 - 145 mmol/L 138  137  141   Potassium 3.5 - 5.1 mmol/L 3.9  4.2  4.1   Chloride 98 - 111 mmol/L 108  107  106   CO2 22 - 32 mmol/L 22  23  22    Calcium  8.9 - 10.3 mg/dL 8.7  8.7  9.1   Total Protein 6.5 - 8.1 g/dL 7.0  7.1  7.1   Total Bilirubin 0.0 - 1.2 mg/dL 1.1  0.9  0.7   Alkaline Phos 38 - 126 U/L 86  93  126   AST 15 - 41 U/L 18  27  24    ALT 0 - 44 U/L 14  21  14       RADIOGRAPHIC STUDIES: I have personally reviewed the radiological images as listed and agreed with the findings in the report. No results found.

## 2023-11-19 NOTE — Assessment & Plan Note (Signed)
 Mild.  Monitor.  Check B12 and folate.

## 2023-11-19 NOTE — Assessment & Plan Note (Signed)
 Stable hemoglobin level. Monitor.

## 2023-11-19 NOTE — Patient Instructions (Signed)
 [facility] - A DEPT OF [facility]  Discharge Instructions: Thank you for choosing [facility] to provide your oncology and hematology care.  If you have a lab appointment with the Cancer Center, please go directly to the Cancer Center and check in at the registration area.  Wear comfortable clothing and clothing appropriate for easy access to any Portacath or PICC line.   We strive to give you quality time with your provider. You may need to reschedule your appointment if you arrive late (15 or more minutes).  Arriving late affects you and other patients whose appointments are after yours.  Also, if you miss three or more appointments without notifying the office, you may be dismissed from the clinic at the provider's discretion.      For prescription refill requests, have your pharmacy contact our office and allow 72 hours for refills to be completed.    Today you received the following chemotherapy and/or immunotherapy agents RUXIENCE       To help prevent nausea and vomiting after your treatment, we encourage you to take your nausea medication as directed.  BELOW ARE SYMPTOMS THAT SHOULD BE REPORTED IMMEDIATELY: *FEVER GREATER THAN 100.4 F (38 C) OR HIGHER *CHILLS OR SWEATING *NAUSEA AND VOMITING THAT IS NOT CONTROLLED WITH YOUR NAUSEA MEDICATION *UNUSUAL SHORTNESS OF BREATH *UNUSUAL BRUISING OR BLEEDING *URINARY PROBLEMS (pain or burning when urinating, or frequent urination) *BOWEL PROBLEMS (unusual diarrhea, constipation, pain near the anus) TENDERNESS IN MOUTH AND THROAT WITH OR WITHOUT PRESENCE OF ULCERS (sore throat, sores in mouth, or a toothache) UNUSUAL RASH, SWELLING OR PAIN  UNUSUAL VAGINAL DISCHARGE OR ITCHING   Items with * indicate a potential emergency and should be followed up as soon as possible or go to the Emergency Department if any problems should occur.  Please show the CHEMOTHERAPY ALERT CARD or IMMUNOTHERAPY  ALERT CARD at check-in to the Emergency Department and triage nurse.  Should you have questions after your visit or need to cancel or reschedule your appointment, please contact [facility] - A DEPT OF [facility]  [phone] and follow the prompts.  Office hours are 8:00 a.m. to 4:30 p.m. Monday - Friday. Please note that voicemails left after 4:00 p.m. may not be returned until the following business day.  We are closed weekends and major holidays. You have access to a nurse at all times for urgent questions. Please call the main number to the clinic [phone] and follow the prompts.  For any non-urgent questions, you may also contact your provider using MyChart. We now offer e-Visits for anyone 11 and older to request care online for non-urgent symptoms. For details visit [email or web address].   Also download the MyChart app! Go to the app store, search "MyChart", open the app, select [place], and log in with your MyChart username and password.  Rituximab  Injection What is this medication? RITUXIMAB  (ri TUX i mab) treats leukemia and lymphoma. It works by blocking a protein that causes cancer cells to grow and multiply. This helps to slow or stop the spread of cancer cells. It may also be used to treat autoimmune conditions, such as arthritis. It works by slowing down an overactive immune system. It is a monoclonal antibody. This medicine may be used for other purposes; ask your health care provider or pharmacist if you have questions. COMMON BRAND NAME(S): RIABNI , Rituxan , RUXIENCE , truxima  What should I tell my care  team before I take this medication? They need to know if you have any of these conditions: Chest pain Heart disease Immune system problems Infection, such as chickenpox, cold sores, hepatitis B, herpes Irregular heartbeat or rhythm Kidney disease Low blood counts, such as low white cells, platelets, red cells Lung disease Recent or  upcoming vaccine An unusual or allergic reaction to rituximab , other medications, foods, dyes, or preservatives Pregnant or trying to get pregnant Breast-feeding How should I use this medication? This medication is injected into a vein. It is given by a care team in a hospital or clinic setting. A special MedGuide will be given to you before each treatment. Be sure to read this information carefully each time. Talk to your care team about the use of this medication in children. While this medication may be prescribed for children as young as 6 months for selected conditions, precautions do apply. Overdosage: If you think you have taken too much of this medicine contact a poison control center or emergency room at once. NOTE: This medicine is only for you. Do not share this medicine with others. What if I miss a dose? Keep appointments for follow-up doses. It is important not to miss your dose. Call your care team if you are unable to keep an appointment. What may interact with this medication? Do not take this medication with any of the following: Live vaccines This medication may also interact with the following: Cisplatin This list may not describe all possible interactions. Give your health care provider a list of all the medicines, herbs, non-prescription drugs, or dietary supplements you use. Also tell them if you smoke, drink alcohol, or use illegal drugs. Some items may interact with your medicine. What should I watch for while using this medication? Your condition will be monitored carefully while you are receiving this medication. You may need blood work while taking this medication. This medication can cause serious infusion reactions. To reduce the risk your care team may give you other medications to take before receiving this one. Be sure to follow the directions from your care team. This medication may increase your risk of getting an infection. Call your care team for advice if  you get a fever, chills, sore throat, or other symptoms of a cold or flu. Do not treat yourself. Try to avoid being around people who are sick. Call your care team if you are around anyone with measles, chickenpox, or if you develop sores or blisters that do not heal properly. Avoid taking medications that contain aspirin, acetaminophen , ibuprofen, naproxen, or ketoprofen unless instructed by your care team. These medications may hide a fever. This medication may cause serious skin reactions. They can happen weeks to months after starting the medication. Contact your care team right away if you notice fevers or flu-like symptoms with a rash. The rash may be red or purple and then turn into blisters or peeling of the skin. You may also notice a red rash with swelling of the face, lips, or lymph nodes in your neck or under your arms. In some patients, this medication may cause a serious brain infection that may cause death. If you have any problems seeing, thinking, speaking, walking, or standing, tell your care team right away. If you cannot reach your care team, urgently seek another source of medical care. Talk to your care team if you may be pregnant. Serious birth defects can occur if you take this medication during pregnancy and for 12 months after the  last dose. You will need a negative pregnancy test before starting this medication. Contraception is recommended while taking this medication and for 12 months after the last dose. Your care team can help you find the option that works for you. Do not breastfeed while taking this medication and for at least 6 months after the last dose. What side effects may I notice from receiving this medication? Side effects that you should report to your care team as soon as possible: Allergic reactions or angioedema--skin rash, itching or hives, swelling of the face, eyes, lips, tongue, arms, or legs, trouble swallowing or breathing Bowel blockage--stomach cramping,  unable to have a bowel movement or pass gas, loss of appetite, vomiting Dizziness, loss of balance or coordination, confusion or trouble speaking Heart attack--pain or tightness in the chest, shoulders, arms, or jaw, nausea, shortness of breath, cold or clammy skin, feeling faint or lightheaded Heart rhythm changes--fast or irregular heartbeat, dizziness, feeling faint or lightheaded, chest pain, trouble breathing Infection--fever, chills, cough, sore throat, wounds that don't heal, pain or trouble when passing urine, general feeling of discomfort or being unwell Infusion reactions--chest pain, shortness of breath or trouble breathing, feeling faint or lightheaded Kidney injury--decrease in the amount of urine, swelling of the ankles, hands, or feet Liver injury--right upper belly pain, loss of appetite, nausea, light-colored stool, dark yellow or brown urine, yellowing skin or eyes, unusual weakness or fatigue Redness, blistering, peeling, or loosening of the skin, including inside the mouth Stomach pain that is severe, does not go away, or gets worse Tumor lysis syndrome (TLS)--nausea, vomiting, diarrhea, decrease in the amount of urine, dark urine, unusual weakness or fatigue, confusion, muscle pain or cramps, fast or irregular heartbeat, joint pain Side effects that usually do not require medical attention (report to your care team if they continue or are bothersome): Headache Joint pain Nausea Runny or stuffy nose Unusual weakness or fatigue This list may not describe all possible side effects. Call your doctor for medical advice about side effects. You may report side effects to FDA at 1-800-FDA-1088. Where should I keep my medication? This medication is given in a hospital or clinic. It will not be stored at home. NOTE: This sheet is a summary. It may not cover all possible information. If you have questions about this medicine, talk to your doctor, pharmacist, or health care provider.   2024 Elsevier/Gold Standard (2021-07-04 00:00:00)

## 2023-11-19 NOTE — Assessment & Plan Note (Addendum)
 PET scan and biopsy pathology results reviewed and discussed with patient. 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  Will further discuss with pathologist  and add on prognostic factors including IGHV, SOX11, TP53     Normal LDH. MIPI score is 6.5,  high risk. Negative hepatitis and HIV. TP53 negative.  He does not have significant GI symptoms except chronic acid reflux. He defers EGD and colonoscopy to rule out occult GI MCL.  Patient has high MIPI score, low Ki67. Mantle cell lymphoma involving pericardia fat and mediastinal lymph nodes.  I recommend chemotherapy treatment with Acalabrutinib  plus Rituximab .   Labs are reviewed and discussed with patient. Proceed with Rituximab  today. Continue Acalabrutinib  100mg  BID today.  He tolerates well.

## 2023-11-24 ENCOUNTER — Other Ambulatory Visit: Payer: Self-pay

## 2023-11-24 ENCOUNTER — Other Ambulatory Visit (HOSPITAL_COMMUNITY): Payer: Self-pay

## 2023-11-24 NOTE — Progress Notes (Signed)
 Specialty Pharmacy Ongoing Clinical Assessment Note  Blake Padilla is a 73 y.o. male who is being followed by the specialty pharmacy service for RxSp Oncology   Patient's specialty medication(s) reviewed today: Acalabrutinib  Maleate ([facility] )   Missed doses in the last 4 weeks: 0   Patient/Caregiver did not have any additional questions or concerns.   Therapeutic benefit summary: Patient is achieving benefit   Adverse events/side effects summary: No adverse events/side effects   Patient's therapy is appropriate to: Continue    Goals Addressed             This Visit's Progress    Achieve or maintain remission   No change    Patient is initiating therapy. Patient will maintain adherence         Follow up: 3 months  Silvano LOISE Dolly Specialty Pharmacist

## 2023-11-27 ENCOUNTER — Inpatient Hospital Stay: Attending: Oncology

## 2023-11-27 VITALS — BP 122/46 | HR 70 | Temp 97.3°F | Resp 16 | Wt 229.3 lb

## 2023-11-27 DIAGNOSIS — C8312 Mantle cell lymphoma, intrathoracic lymph nodes: Secondary | ICD-10-CM | POA: Diagnosis present

## 2023-11-27 DIAGNOSIS — D649 Anemia, unspecified: Secondary | ICD-10-CM | POA: Diagnosis not present

## 2023-11-27 DIAGNOSIS — Z79624 Long term (current) use of inhibitors of nucleotide synthesis: Secondary | ICD-10-CM | POA: Insufficient documentation

## 2023-11-27 DIAGNOSIS — Z87891 Personal history of nicotine dependence: Secondary | ICD-10-CM | POA: Diagnosis not present

## 2023-11-27 DIAGNOSIS — Z5112 Encounter for antineoplastic immunotherapy: Secondary | ICD-10-CM | POA: Insufficient documentation

## 2023-11-27 DIAGNOSIS — Z7982 Long term (current) use of aspirin: Secondary | ICD-10-CM | POA: Insufficient documentation

## 2023-11-27 DIAGNOSIS — Z79899 Other long term (current) drug therapy: Secondary | ICD-10-CM | POA: Insufficient documentation

## 2023-11-27 MED ORDER — ACETAMINOPHEN 325 MG PO TABS
650.0000 mg | ORAL_TABLET | Freq: Once | ORAL | Status: AC
  Administered 2023-11-27: 650 mg via ORAL
  Filled 2023-11-27: qty 2

## 2023-11-27 MED ORDER — SODIUM CHLORIDE 0.9 % IV SOLN
INTRAVENOUS | Status: DC
  Filled 2023-11-27: qty 250

## 2023-11-27 MED ORDER — LORAZEPAM 2 MG/ML IJ SOLN
0.5000 mg | INTRAMUSCULAR | Status: DC | PRN
  Administered 2023-11-27: 0.5 mg via INTRAVENOUS
  Filled 2023-11-27: qty 1

## 2023-11-27 MED ORDER — SODIUM CHLORIDE 0.9 % IV SOLN
375.0000 mg/m2 | Freq: Once | INTRAVENOUS | Status: AC
  Administered 2023-11-27: 900 mg via INTRAVENOUS
  Filled 2023-11-27: qty 40

## 2023-11-27 MED ORDER — DIPHENHYDRAMINE HCL 25 MG PO CAPS
50.0000 mg | ORAL_CAPSULE | Freq: Once | ORAL | Status: AC
  Administered 2023-11-27: 50 mg via ORAL
  Filled 2023-11-27: qty 2

## 2023-11-27 NOTE — Patient Instructions (Addendum)
 Rituximab Injection What is this medication? RITUXIMAB (ri TUX i mab) treats leukemia and lymphoma. It works by blocking a protein that causes cancer cells to grow and multiply. This helps to slow or stop the spread of cancer cells. It may also be used to treat autoimmune conditions, such as arthritis. It works by slowing down an overactive immune system. It is a monoclonal antibody. This medicine may be used for other purposes; ask your health care provider or pharmacist if you have questions. COMMON BRAND NAME(S): RIABNI, Rituxan, RUXIENCE, truxima What should I tell my care team before I take this medication? They need to know if you have any of these conditions: Chest pain Heart disease Immune system problems Infection, such as chickenpox, cold sores, hepatitis B, herpes Irregular heartbeat or rhythm Kidney disease Low blood counts, such as low white cells, platelets, red cells Lung disease Recent or upcoming vaccine An unusual or allergic reaction to rituximab, other medications, foods, dyes, or preservatives Pregnant or trying to get pregnant Breast-feeding How should I use this medication? This medication is injected into a vein. It is given by a care team in a hospital or clinic setting. A special MedGuide will be given to you before each treatment. Be sure to read this information carefully each time. Talk to your care team about the use of this medication in children. While this medication may be prescribed for children as young as 6 months for selected conditions, precautions do apply. Overdosage: If you think you have taken too much of this medicine contact a poison control center or emergency room at once. NOTE: This medicine is only for you. Do not share this medicine with others. What if I miss a dose? Keep appointments for follow-up doses. It is important not to miss your dose. Call your care team if you are unable to keep an appointment. What may interact with this  medication? Do not take this medication with any of the following: Live vaccines This medication may also interact with the following: Cisplatin This list may not describe all possible interactions. Give your health care provider a list of all the medicines, herbs, non-prescription drugs, or dietary supplements you use. Also tell them if you smoke, drink alcohol, or use illegal drugs. Some items may interact with your medicine. What should I watch for while using this medication? Your condition will be monitored carefully while you are receiving this medication. You may need blood work while taking this medication. This medication can cause serious infusion reactions. To reduce the risk your care team may give you other medications to take before receiving this one. Be sure to follow the directions from your care team. This medication may increase your risk of getting an infection. Call your care team for advice if you get a fever, chills, sore throat, or other symptoms of a cold or flu. Do not treat yourself. Try to avoid being around people who are sick. Call your care team if you are around anyone with measles, chickenpox, or if you develop sores or blisters that do not heal properly. Avoid taking medications that contain aspirin, acetaminophen, ibuprofen, naproxen, or ketoprofen unless instructed by your care team. These medications may hide a fever. This medication may cause serious skin reactions. They can happen weeks to months after starting the medication. Contact your care team right away if you notice fevers or flu-like symptoms with a rash. The rash may be red or purple and then turn into blisters or peeling of the skin.  You may also notice a red rash with swelling of the face, lips, or lymph nodes in your neck or under your arms. In some patients, this medication may cause a serious brain infection that may cause death. If you have any problems seeing, thinking, speaking, walking, or  standing, tell your care team right away. If you cannot reach your care team, urgently seek another source of medical care. Talk to your care team if you may be pregnant. Serious birth defects can occur if you take this medication during pregnancy and for 12 months after the last dose. You will need a negative pregnancy test before starting this medication. Contraception is recommended while taking this medication and for 12 months after the last dose. Your care team can help you find the option that works for you. Do not breastfeed while taking this medication and for at least 6 months after the last dose. What side effects may I notice from receiving this medication? Side effects that you should report to your care team as soon as possible: Allergic reactions or angioedema--skin rash, itching or hives, swelling of the face, eyes, lips, tongue, arms, or legs, trouble swallowing or breathing Bowel blockage--stomach cramping, unable to have a bowel movement or pass gas, loss of appetite, vomiting Dizziness, loss of balance or coordination, confusion or trouble speaking Heart attack--pain or tightness in the chest, shoulders, arms, or jaw, nausea, shortness of breath, cold or clammy skin, feeling faint or lightheaded Heart rhythm changes--fast or irregular heartbeat, dizziness, feeling faint or lightheaded, chest pain, trouble breathing Infection--fever, chills, cough, sore throat, wounds that don't heal, pain or trouble when passing urine, general feeling of discomfort or being unwell Infusion reactions--chest pain, shortness of breath or trouble breathing, feeling faint or lightheaded Kidney injury--decrease in the amount of urine, swelling of the ankles, hands, or feet Liver injury--right upper belly pain, loss of appetite, nausea, light-colored stool, dark yellow or brown urine, yellowing skin or eyes, unusual weakness or fatigue Redness, blistering, peeling, or loosening of the skin, including  inside the mouth Stomach pain that is severe, does not go away, or gets worse Tumor lysis syndrome (TLS)--nausea, vomiting, diarrhea, decrease in the amount of urine, dark urine, unusual weakness or fatigue, confusion, muscle pain or cramps, fast or irregular heartbeat, joint pain Side effects that usually do not require medical attention (report to your care team if they continue or are bothersome): Headache Joint pain Nausea Runny or stuffy nose Unusual weakness or fatigue This list may not describe all possible side effects. Call your doctor for medical advice about side effects. You may report side effects to FDA at 1-800-FDA-1088. Where should I keep my medication? This medication is given in a hospital or clinic. It will not be stored at home. NOTE: This sheet is a summary. It may not cover all possible information. If you have questions about this medicine, talk to your doctor, pharmacist, or health care provider.  2024 Elsevier/Gold Standard (2021-07-04 00:00:00)

## 2023-12-02 ENCOUNTER — Other Ambulatory Visit: Payer: Self-pay

## 2023-12-02 NOTE — Progress Notes (Signed)
 Specialty Pharmacy Refill Coordination Note  Blake Padilla is a 73 y.o. male contacted today regarding refills of specialty medication(s) Acalabrutinib  Maleate ([facility] )   Patient requested Delivery   Delivery date: 12/08/23   Verified address: [address] [place] [address]   Medication will be filled on 10.13.25.

## 2023-12-03 ENCOUNTER — Inpatient Hospital Stay

## 2023-12-03 VITALS — BP 106/62 | HR 66 | Temp 97.6°F | Resp 16 | Wt 231.5 lb

## 2023-12-03 DIAGNOSIS — Z5112 Encounter for antineoplastic immunotherapy: Secondary | ICD-10-CM | POA: Diagnosis not present

## 2023-12-03 DIAGNOSIS — C8312 Mantle cell lymphoma, intrathoracic lymph nodes: Secondary | ICD-10-CM

## 2023-12-03 LAB — CBC (CANCER CENTER ONLY)
HCT: 34.4 % — ABNORMAL LOW (ref 39.0–52.0)
Hemoglobin: 11.4 g/dL — ABNORMAL LOW (ref 13.0–17.0)
MCH: 28.9 pg (ref 26.0–34.0)
MCHC: 33.1 g/dL (ref 30.0–36.0)
MCV: 87.3 fL (ref 80.0–100.0)
Platelet Count: 167 K/uL (ref 150–400)
RBC: 3.94 MIL/uL — ABNORMAL LOW (ref 4.22–5.81)
RDW: 14 % (ref 11.5–15.5)
WBC Count: 6.2 K/uL (ref 4.0–10.5)
nRBC: 0 % (ref 0.0–0.2)

## 2023-12-03 LAB — CMP (CANCER CENTER ONLY)
ALT: 10 U/L (ref 0–44)
AST: 14 U/L — ABNORMAL LOW (ref 15–41)
Albumin: 3.8 g/dL (ref 3.5–5.0)
Alkaline Phosphatase: 90 U/L (ref 38–126)
Anion gap: 7 (ref 5–15)
BUN: 17 mg/dL (ref 8–23)
CO2: 25 mmol/L (ref 22–32)
Calcium: 8.8 mg/dL — ABNORMAL LOW (ref 8.9–10.3)
Chloride: 108 mmol/L (ref 98–111)
Creatinine: 1.1 mg/dL (ref 0.61–1.24)
GFR, Estimated: >60 mL/min (ref >60)
Glucose, Bld: 126 mg/dL — ABNORMAL HIGH (ref 70–99)
Potassium: 3.9 mmol/L (ref 3.5–5.1)
Sodium: 140 mmol/L (ref 135–145)
Total Bilirubin: 0.8 mg/dL (ref 0.0–1.2)
Total Protein: 7 g/dL (ref 6.5–8.1)

## 2023-12-03 LAB — VITAMIN B12: Vitamin B-12: <150 pg/mL — ABNORMAL LOW (ref 180–914)

## 2023-12-03 LAB — FOLATE: Folate: 8.4 ng/mL (ref >5.9)

## 2023-12-03 MED ORDER — SODIUM CHLORIDE 0.9 % IV SOLN
INTRAVENOUS | Status: DC
  Filled 2023-12-03: qty 250

## 2023-12-03 MED ORDER — ACETAMINOPHEN 325 MG PO TABS
650.0000 mg | ORAL_TABLET | Freq: Once | ORAL | Status: AC
  Administered 2023-12-03: 650 mg via ORAL
  Filled 2023-12-03: qty 2

## 2023-12-03 MED ORDER — LORAZEPAM 2 MG/ML IJ SOLN
0.5000 mg | INTRAMUSCULAR | Status: DC | PRN
  Administered 2023-12-03: 0.5 mg via INTRAVENOUS
  Filled 2023-12-03: qty 1

## 2023-12-03 MED ORDER — DIPHENHYDRAMINE HCL 25 MG PO CAPS
50.0000 mg | ORAL_CAPSULE | Freq: Once | ORAL | Status: AC
  Administered 2023-12-03: 50 mg via ORAL
  Filled 2023-12-03: qty 2

## 2023-12-03 MED ORDER — SODIUM CHLORIDE 0.9 % IV SOLN
375.0000 mg/m2 | Freq: Once | INTRAVENOUS | Status: AC
  Administered 2023-12-03: 900 mg via INTRAVENOUS
  Filled 2023-12-03: qty 40

## 2023-12-03 NOTE — Patient Instructions (Signed)
 [facility] - A DEPT OF [facility]  Discharge Instructions: Thank you for choosing [facility] to provide your oncology and hematology care.  If you have a lab appointment with the Cancer Center, please go directly to the Cancer Center and check in at the registration area.  Wear comfortable clothing and clothing appropriate for easy access to any Portacath or PICC line.   We strive to give you quality time with your provider. You may need to reschedule your appointment if you arrive late (15 or more minutes).  Arriving late affects you and other patients whose appointments are after yours.  Also, if you miss three or more appointments without notifying the office, you may be dismissed from the clinic at the provider's discretion.      For prescription refill requests, have your pharmacy contact our office and allow 72 hours for refills to be completed.    Today you received the following chemotherapy and/or immunotherapy agents Rituximab -RVVR      To help prevent nausea and vomiting after your treatment, we encourage you to take your nausea medication as directed.  BELOW ARE SYMPTOMS THAT SHOULD BE REPORTED IMMEDIATELY: *FEVER GREATER THAN 100.4 F (38 C) OR HIGHER *CHILLS OR SWEATING *NAUSEA AND VOMITING THAT IS NOT CONTROLLED WITH YOUR NAUSEA MEDICATION *UNUSUAL SHORTNESS OF BREATH *UNUSUAL BRUISING OR BLEEDING *URINARY PROBLEMS (pain or burning when urinating, or frequent urination) *BOWEL PROBLEMS (unusual diarrhea, constipation, pain near the anus) TENDERNESS IN MOUTH AND THROAT WITH OR WITHOUT PRESENCE OF ULCERS (sore throat, sores in mouth, or a toothache) UNUSUAL RASH, SWELLING OR PAIN  UNUSUAL VAGINAL DISCHARGE OR ITCHING   Items with * indicate a potential emergency and should be followed up as soon as possible or go to the Emergency Department if any problems should occur.  Please show the CHEMOTHERAPY ALERT CARD or  IMMUNOTHERAPY ALERT CARD at check-in to the Emergency Department and triage nurse.  Should you have questions after your visit or need to cancel or reschedule your appointment, please contact [facility] - A DEPT OF [facility]  [phone] and follow the prompts.  Office hours are 8:00 a.m. to 4:30 p.m. Monday - Friday. Please note that voicemails left after 4:00 p.m. may not be returned until the following business day.  We are closed weekends and major holidays. You have access to a nurse at all times for urgent questions. Please call the main number to the clinic [phone] and follow the prompts.  For any non-urgent questions, you may also contact your provider using MyChart. We now offer e-Visits for anyone 34 and older to request care online for non-urgent symptoms. For details visit [email or web address].   Also download the MyChart app! Go to the app store, search MyChart, open the app, select [facility], and log in with your MyChart username and password.

## 2023-12-04 ENCOUNTER — Other Ambulatory Visit: Payer: Self-pay

## 2023-12-04 ENCOUNTER — Ambulatory Visit: Payer: Self-pay | Admitting: Oncology

## 2023-12-07 ENCOUNTER — Other Ambulatory Visit: Payer: Self-pay

## 2023-12-07 NOTE — Progress Notes (Signed)
 called and no answer. Left detailed VM on mobile ph ([phone])

## 2023-12-17 ENCOUNTER — Inpatient Hospital Stay

## 2023-12-17 ENCOUNTER — Inpatient Hospital Stay: Admitting: Oncology

## 2023-12-17 ENCOUNTER — Encounter: Payer: Self-pay | Admitting: Oncology

## 2023-12-17 VITALS — BP 128/74 | HR 76 | Temp 98.3°F | Resp 18 | Wt 228.9 lb

## 2023-12-17 DIAGNOSIS — D649 Anemia, unspecified: Secondary | ICD-10-CM

## 2023-12-17 DIAGNOSIS — Z5112 Encounter for antineoplastic immunotherapy: Secondary | ICD-10-CM | POA: Diagnosis not present

## 2023-12-17 DIAGNOSIS — C8312 Mantle cell lymphoma, intrathoracic lymph nodes: Secondary | ICD-10-CM

## 2023-12-17 LAB — CMP (CANCER CENTER ONLY)
ALT: 12 U/L (ref 0–44)
AST: 16 U/L (ref 15–41)
Albumin: 3.8 g/dL (ref 3.5–5.0)
Alkaline Phosphatase: 97 U/L (ref 38–126)
Anion gap: 7 (ref 5–15)
BUN: 18 mg/dL (ref 8–23)
CO2: 23 mmol/L (ref 22–32)
Calcium: 8.6 mg/dL — ABNORMAL LOW (ref 8.9–10.3)
Chloride: 107 mmol/L (ref 98–111)
Creatinine: 1.24 mg/dL (ref 0.61–1.24)
GFR, Estimated: >60 mL/min (ref >60)
Glucose, Bld: 119 mg/dL — ABNORMAL HIGH (ref 70–99)
Potassium: 3.9 mmol/L (ref 3.5–5.1)
Sodium: 137 mmol/L (ref 135–145)
Total Bilirubin: 0.9 mg/dL (ref 0.0–1.2)
Total Protein: 7.1 g/dL (ref 6.5–8.1)

## 2023-12-17 LAB — CBC WITH DIFFERENTIAL (CANCER CENTER ONLY)
Abs Immature Granulocytes: 0.01 K/uL (ref 0.00–0.07)
Basophils Absolute: 0 K/uL (ref 0.0–0.1)
Basophils Relative: 0 %
Eosinophils Absolute: 0.1 K/uL (ref 0.0–0.5)
Eosinophils Relative: 2 %
HCT: 35.5 % — ABNORMAL LOW (ref 39.0–52.0)
Hemoglobin: 11.4 g/dL — ABNORMAL LOW (ref 13.0–17.0)
Immature Granulocytes: 0 %
Lymphocytes Relative: 34 %
Lymphs Abs: 1.8 K/uL (ref 0.7–4.0)
MCH: 28.2 pg (ref 26.0–34.0)
MCHC: 32.1 g/dL (ref 30.0–36.0)
MCV: 87.9 fL (ref 80.0–100.0)
Monocytes Absolute: 0.8 K/uL (ref 0.1–1.0)
Monocytes Relative: 15 %
Neutro Abs: 2.6 K/uL (ref 1.7–7.7)
Neutrophils Relative %: 49 %
Platelet Count: 175 K/uL (ref 150–400)
RBC: 4.04 MIL/uL — ABNORMAL LOW (ref 4.22–5.81)
RDW: 13.5 % (ref 11.5–15.5)
WBC Count: 5.3 K/uL (ref 4.0–10.5)
nRBC: 0 % (ref 0.0–0.2)

## 2023-12-17 MED ORDER — ACYCLOVIR 400 MG PO TABS: 400.0000 mg | ORAL_TABLET | Freq: Every day | ORAL | 3 refills | Status: AC

## 2023-12-17 NOTE — Assessment & Plan Note (Addendum)
 PET scan and biopsy pathology results reviewed and discussed with patient. 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  Will further discuss with pathologist  and add on prognostic factors including IGHV, SOX11, TP53     Normal LDH. MIPI score is 6.5,  high risk. Negative hepatitis and HIV. TP53 negative.  He does not have significant GI symptoms except chronic acid reflux. He defers EGD and colonoscopy to rule out occult GI MCL.  Patient has high MIPI score, low Ki67. Mantle cell lymphoma involving pericardia fat and mediastinal lymph nodes.  I recommend chemotherapy treatment with Acalabrutinib  plus Rituximab .  Labs are reviewed and discussed with patient. S/p weekly Rituximab  x 4. Continue once a month for 12 months and subsequently once every 2 months totaling 24 months. Continue Acalabrutinib  100mg  BID today. Recommend shingle prophylaxis with Acyclovir 400mg  daily  He tolerates well.

## 2023-12-17 NOTE — Assessment & Plan Note (Signed)
 Stable hemoglobin level. Monitor.

## 2023-12-17 NOTE — Progress Notes (Signed)
 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  Will further discuss with pathologist  and add on prognostic factors including IGHV, SOX11, TP53     Normal LDH. MIPI score is 6.5,  high risk. Negative hepatitis and HIV. TP53 negative.  He does not have significant GI symptoms except chronic acid reflux. He defers EGD and colonoscopy to rule out occult GI MCL.  Patient has high MIPI score, low Ki67. Mantle cell lymphoma involving pericardia fat and mediastinal lymph nodes.  I recommend chemotherapy treatment with Acalabrutinib  plus Rituximab .  Labs are reviewed and discussed with patient. S/p weekly Rituximab  x 4. Continue once a month for 12 months and subsequently once every 2 months totaling 24 months. Continue Acalabrutinib  100mg  BID today. Recommend shingle prophylaxis with Acyclovir 400mg  daily  He tolerates well.    Normocytic anemia Stable hemoglobin level. Monitor.   He declined influenza vaccination Orders Placed This Encounter  Procedures   CMP (Cancer Center only)    Standing Status:   Future    Expected Date:   01/29/2024    Expiration Date:   01/28/2025   CBC (Cancer Center Only)    Standing Status:   Future    Expected Date:   01/29/2024    Expiration Date:   01/28/2025   CMP (Cancer Center only)    Standing Status:   Future    Expected Date:   02/26/2024    Expiration Date:   02/25/2025   CBC (Cancer Center Only)    Standing Status:   Future    Expected Date:   02/26/2024    Expiration Date:   02/25/2025   Vitamin B12    Standing Status:   Future    Expected Date:   01/29/2024    Expiration Date:   01/28/2025   Follow up per LOS All questions  were answered. The patient knows to call the clinic with any problems, questions or concerns.  Zelphia Cap, MD, PhD [facility] Hematology Oncology 12/17/2023    HISTORY OF PRESENTING ILLNESS:  Blake Padilla 73 y.o. male presents to establish care for mantle cell lymphoma I have reviewed his chart and materials related to his cancer extensively and collaborated history with the patient. 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 Newly mildly enlarged 1.7 cm right pericardiophrenic node, indeterminate for metastatic disease. Suggest follow-up chest CT with IV contrast in 3 months. Alternatively, PET-CT could be considered at this time. 2. Lung-RADS 2, benign appearance or behavior. Continue annual screening with low-dose chest CT without contrast in 12 months. 3. Two-vessel coronary atherosclerosis. 4. Stable left adrenal adenoma, for which no follow-up imaging is recommended. 5. Aortic Atherosclerosis (ICD10-I70.0) and Emphysema (ICD10-J43.9).   08/03/2023 Imaging   PET scan showed Hypermetabolic mediastinal lymph nodes in the right cardiophrenic angle and retrosternal anterior mediastinum. Differential diagnosis includes lymphoproliferative disorder, inflammatory or infectious etiologies, and less likely metastatic disease.   Stable 5 mm right upper lobe pulmonary nodule, without FDG uptake suggesting benign etiology. Recommend continued attention on follow-up imaging.   10/06/2023 Initial Diagnosis   Mantle cell  lymphoma of intrathoracic lymph nodes   09/15/2023 patient underwent Robotic-assisted right VATS resection of pericardial fat pad and anterior mediastinal lymph nodes.   Pathology showed  A. PERICARDIAL FAT PAD NODULE, EXCISION:  -  Mantle cell lymphoma, see note.   B. PERICARDIAL FAT PAD NODULE, EXCISION:  -  Mantle cell  lymphoma, see note.   C. LYMPH NODE, ANTERIOR MEDIASTINAL, EXCISION:  -  Mantle cell lymphoma, see note.   D. LYMPH NODES, ANTERIOR MEDIASTINAL #2, RESECTION:  -  Mantle cell lymphoma, see note.   The various lymph nodes present in parts a through D show a varied amount of architectural preservation with the obvious presence of retained secondary follicle/germinal centers with evidence of polarization and tingible body macrophages.  In addition, many of the lymph nodes show the preservation of open sinuses while other lymph nodes show loss of sinuses and evidence of extracapsular extension by small lymphocytes.  Overall there is a prominent nodular pattern, which depending on the node is secondary to prominent expansion of the mantle zone cuff up to including a partial diffuse infiltrate of the cortex and paracortex.  The majority of the cells are CD20 positive B cells which include both the cells involve and mantle zone expansion and focal diffuse infiltrate as well as those present in the reactive germinal centers (CD10/BCL6 positive and appropriately Bcl-2 negative CD23 highlights a follicular dendritic cell meshwork) the cells of interest in the expanded mantle zone cuffs and partial diffuse nature are weakly coexpressing CD5 as compared to the CD3/CD43 positive T cells.  In addition they are also strongly positive for nuclear expression of cyclin D1.  Ki-67 shows a normal immunoarchitecture in the areas of germinal centers and is overall less than 5% overall in the diffuse areas and expanded mantle cell cuffs.  Given the patchy nature of the disease the aforementioned immunohistochemical stains were performed on parts B, C and D.  In addition, flow cytometry, performed on part A, identifies 76% of the cells to be lambda restricted CD5 dim positive B cells.  These findings are consistent with mantle cell lymphoma.    10/06/2023 Cancer Staging   Staging form: Hodgkin and Non-Hodgkin Lymphoma, AJCC  8th Edition - Clinical stage from 10/06/2023: Stage II (Mantle cell lymphoma) - Signed by Babara Call, MD on 10/06/2023 Stage prefix: Initial diagnosis T(11;18)(q21;q21): Unknown HCV: Unknown   11/12/2023 -  Chemotherapy   Acalabrutinib  100 mg orally twice a day and intravenous rituximab , planweekly for first 4 weeks, followed by once a month for 12 months and subsequently once every 2 months totaling 24 months.     Patient has history of COPD, mild shortness of breath with exertion..  he remains active for his age.  He plays golf 3-4 times a week.  Former smoker, quit in October 2022. Patient denies any GI symptoms except chronic acid reflux which is controlled with PPI.  Denies any unintentional weight loss, night sweats or fever.  He is accompanied by wife. He tolerated Rituximab  and acalabrutinib  well.  He has no new concerns.   MEDICAL HISTORY:  Past Medical History:  Diagnosis Date   Anxiety    in small places   Arthritis    COPD (chronic obstructive pulmonary disease) (HCC)    Pt denies   Coronary artery disease    GERD (gastroesophageal reflux disease)    Hypertension    Iliac aneurysm 01/16/2021   Panic attacks    Peripheral vascular disease    Iliac Artery Aneurysm Repair  SURGICAL HISTORY: Past Surgical History:  Procedure Laterality Date   CATARACT EXTRACTION W/ INTRAOCULAR LENS IMPLANT Right    ENDOVASCULAR STENT GRAFT (AAA) N/A 01/16/2021   Procedure: ENDOVASCULAR REPAIR/STENT GRAFT;  Surgeon: Marea Selinda RAMAN, MD;  Location: [facility];  Service: Cardiovascular;  Laterality: N/A;   EXCISION, MASS, MEDIASTINUM, ROBOT-ASSISTED Right 09/14/2023   Procedure: EXCISION, MASS, MEDIASTINUM, ROBOT-ASSISTED;  Surgeon: Kerrin Elspeth BROCKS, MD;  Location: [facility] OR;  Service: Thoracic;  Laterality: Right;  RIGHT ROBOTIC RESECTION OF PERICARDIAL FAT PAD AND MEDIASTINAL LYMPH NODES   ILIAC ARTERY ANEURYSM REPAIR  01/16/2021   INTERCOSTAL NERVE BLOCK N/A 09/14/2023    Procedure: BLOCK, NERVE, INTERCOSTAL;  Surgeon: Kerrin Elspeth BROCKS, MD;  Location: [facility] OR;  Service: Thoracic;  Laterality: N/A;   TOTAL HIP ARTHROPLASTY Left 06/11/2021   Procedure: TOTAL HIP ARTHROPLASTY ANTERIOR APPROACH;  Surgeon: Kathlynn Sharper, MD;  Location: [facility] ORS;  Service: Orthopedics;  Laterality: Left;    SOCIAL HISTORY: Social History   Socioeconomic History   Marital status: Married    Spouse name: Susie   Number of children: 1   Years of education: Not on file   Highest education level: Not on file  Occupational History   Not on file  Tobacco Use   Smoking status: Former    Current packs/day: 0.00    Average packs/day: 1 pack/day for 52.8 years (52.8 ttl pk-yrs)    Types: Cigarettes    Start date: 59    Quit date: 12/13/2021    Years since quitting: 2.0   Smokeless tobacco: Never   Tobacco comments:    Quit smoking 11/2021  Vaping Use   Vaping status: Never Used  Substance and Sexual Activity   Alcohol use: Not Currently    Comment: with colds   Drug use: Never   Sexual activity: Yes  Other Topics Concern   Not on file  Social History Narrative   Not on file   Social Drivers of Health   Financial Resource Strain: Low Risk  (10/06/2023)   Overall Financial Resource Strain (CARDIA)    Difficulty of Paying Living Expenses: Not very hard  Food Insecurity: No Food Insecurity (10/06/2023)   Hunger Vital Sign    Worried About Running Out of Food in the Last Year: Never true    Ran Out of Food in the Last Year: Never true  Transportation Needs: No Transportation Needs (10/06/2023)   PRAPARE - Administrator, Civil Service (Medical): No    Lack of Transportation (Non-Medical): No  Physical Activity: Not on file  Stress: No Stress Concern Present (10/06/2023)   [employer] of Occupational Health - Occupational Stress Questionnaire    Feeling of Stress: Not at all  Social Connections: Socially Integrated (09/14/2023)   Social Connection  and Isolation Panel    Frequency of Communication with Friends and Family: Three times a week    Frequency of Social Gatherings with Friends and Family: Once a week    Attends Religious Services: More than 4 times per year    Active Member of [employer] or Organizations: Yes    Attends Banker Meetings: 1 to 4 times per year    Marital Status: Married  Catering manager Violence: Not At Risk (10/06/2023)   Humiliation, Afraid, Rape, and Kick questionnaire    Fear of Current or Ex-Partner: No    Emotionally Abused: No    Physically Abused: No    Sexually Abused: No    FAMILY HISTORY:  Family History  Problem Relation Age of Onset   Diabetes Mother    Hypertension Mother    Diabetes Father    Hypertension Father    Leukemia Sister    Diabetes Brother    Dementia Sister    COPD Sister    Healthy Sister     ALLERGIES:  has no known allergies.  MEDICATIONS:  Current Outpatient Medications  Medication Sig Dispense Refill   acalabrutinib  maleate (CALQUENCE ) 100 MG tablet Take 1 tablet (100 mg total) by mouth 2 (two) times daily. 60 tablet 1   acyclovir (ZOVIRAX) 400 MG tablet Take 1 tablet (400 mg total) by mouth daily. 90 tablet 3   amLODipine  (NORVASC ) 5 MG tablet Take 1 tablet (5 mg total) by mouth daily. 30 tablet 3   aspirin  EC 81 MG EC tablet Take 1 tablet (81 mg total) by mouth daily. Swallow whole. 30 tablet 11   cetirizine (ZYRTEC) 10 MG tablet Take 10 mg by mouth daily.     ezetimibe  (ZETIA ) 10 MG tablet Take 1 tablet (10 mg total) by mouth daily. 90 tablet 3   NEXIUM 40 MG capsule Take 40 mg by mouth daily as needed (indigestion / heart burn).     pravastatin  (PRAVACHOL ) 20 MG tablet Take 1 tablet (20 mg total) by mouth daily.     No current facility-administered medications for this visit.    Review of Systems  Constitutional:  Negative for appetite change, chills, fatigue, fever and unexpected weight change.  HENT:   Negative for hearing loss and voice  change.   Eyes:  Negative for eye problems and icterus.  Respiratory:  Negative for chest tightness, cough and shortness of breath.   Cardiovascular:  Negative for chest pain and leg swelling.  Gastrointestinal:  Negative for abdominal distention and abdominal pain.  Endocrine: Negative for hot flashes.  Genitourinary:  Negative for difficulty urinating, dysuria and frequency.   Musculoskeletal:  Negative for arthralgias.  Skin:  Negative for itching and rash.  Neurological:  Negative for light-headedness and numbness.  Hematological:  Negative for adenopathy. Does not bruise/bleed easily.  Psychiatric/Behavioral:  Negative for confusion.      PHYSICAL EXAMINATION: ECOG PERFORMANCE STATUS: 1 - Symptomatic but completely ambulatory  Vitals:   12/17/23 0916  BP: 128/74  Pulse: 76  Resp: 18  Temp: 98.3 F (36.8 C)  SpO2: 98%   Filed Weights   12/17/23 0916  Weight: 228 lb 14.4 oz (103.8 kg)    Physical Exam Constitutional:      General: He is not in acute distress.    Appearance: He is not diaphoretic.  HENT:     Head: Normocephalic and atraumatic.     Nose: Nose normal.  Eyes:     General: No scleral icterus.    Pupils: Pupils are equal, round, and reactive to light.  Cardiovascular:     Rate and Rhythm: Normal rate and regular rhythm.     Heart sounds: No murmur heard. Pulmonary:     Effort: Pulmonary effort is normal. No respiratory distress.     Breath sounds: No wheezing.  Abdominal:     General: There is no distension.     Palpations: Abdomen is soft.     Tenderness: There is no abdominal tenderness.  Musculoskeletal:        General: Normal range of motion.     Cervical back: Normal range of motion and neck supple.  Skin:    General: Skin is warm and dry.  Findings: No erythema.  Neurological:     Mental Status: He is alert and oriented to person, place, and time. Mental status is at baseline.     Cranial Nerves: No cranial nerve deficit.     Motor:  No abnormal muscle tone.  Psychiatric:        Mood and Affect: Mood and affect normal.      LABORATORY DATA:  I have reviewed the data as listed    Latest Ref Rng & Units 12/17/2023    8:43 AM 12/03/2023    8:43 AM 11/19/2023    8:34 AM  CBC  WBC 4.0 - 10.5 K/uL 5.3  6.2  12.1   Hemoglobin 13.0 - 17.0 g/dL 88.5  88.5  88.2   Hematocrit 39.0 - 52.0 % 35.5  34.4  35.8   Platelets 150 - 400 K/uL 175  167  124       Latest Ref Rng & Units 12/17/2023    8:43 AM 12/03/2023    8:43 AM 11/19/2023    8:34 AM  CMP  Glucose 70 - 99 mg/dL 880  873  854   BUN 8 - 23 mg/dL 18  17  19    Creatinine 0.61 - 1.24 mg/dL 8.75  8.89  8.96   Sodium 135 - 145 mmol/L 137  140  138   Potassium 3.5 - 5.1 mmol/L 3.9  3.9  3.9   Chloride 98 - 111 mmol/L 107  108  108   CO2 22 - 32 mmol/L 23  25  22    Calcium  8.9 - 10.3 mg/dL 8.6  8.8  8.7   Total Protein 6.5 - 8.1 g/dL 7.1  7.0  7.0   Total Bilirubin 0.0 - 1.2 mg/dL 0.9  0.8  1.1   Alkaline Phos 38 - 126 U/L 97  90  86   AST 15 - 41 U/L 16  14  18    ALT 0 - 44 U/L 12  10  14       RADIOGRAPHIC STUDIES: I have personally reviewed the radiological images as listed and agreed with the findings in the report. No results found.

## 2023-12-24 ENCOUNTER — Encounter: Payer: Self-pay | Admitting: Oncology

## 2023-12-30 ENCOUNTER — Other Ambulatory Visit: Payer: Self-pay | Admitting: Oncology

## 2023-12-30 ENCOUNTER — Other Ambulatory Visit: Payer: Self-pay

## 2023-12-30 DIAGNOSIS — C8312 Mantle cell lymphoma, intrathoracic lymph nodes: Secondary | ICD-10-CM

## 2023-12-31 ENCOUNTER — Other Ambulatory Visit: Payer: Self-pay

## 2023-12-31 ENCOUNTER — Other Ambulatory Visit (HOSPITAL_COMMUNITY): Payer: Self-pay

## 2023-12-31 MED ORDER — CALQUENCE 100 MG PO TABS
100.0000 mg | ORAL_TABLET | Freq: Two times a day (BID) | ORAL | 1 refills | Status: AC
  Filled 2023-12-31 – 2024-01-12 (×3): qty 60, 30d supply, fill #0
  Filled 2024-02-05 – 2024-02-11 (×2): qty 60, 30d supply, fill #1

## 2024-01-01 ENCOUNTER — Other Ambulatory Visit: Payer: Self-pay

## 2024-01-01 ENCOUNTER — Inpatient Hospital Stay

## 2024-01-01 ENCOUNTER — Inpatient Hospital Stay: Attending: Oncology

## 2024-01-01 VITALS — BP 125/69 | HR 79 | Temp 96.3°F | Resp 18 | Ht 75.0 in | Wt 230.4 lb

## 2024-01-01 DIAGNOSIS — Z5112 Encounter for antineoplastic immunotherapy: Secondary | ICD-10-CM | POA: Diagnosis present

## 2024-01-01 DIAGNOSIS — C8312 Mantle cell lymphoma, intrathoracic lymph nodes: Secondary | ICD-10-CM | POA: Insufficient documentation

## 2024-01-01 MED ORDER — LORAZEPAM 2 MG/ML IJ SOLN
0.5000 mg | INTRAMUSCULAR | Status: DC | PRN
  Administered 2024-01-01: 0.5 mg via INTRAVENOUS
  Filled 2024-01-01: qty 1

## 2024-01-01 MED ORDER — SODIUM CHLORIDE 0.9 % IV SOLN
375.0000 mg/m2 | Freq: Once | INTRAVENOUS | Status: AC
  Administered 2024-01-01: 900 mg via INTRAVENOUS
  Filled 2024-01-01: qty 40

## 2024-01-01 MED ORDER — DIPHENHYDRAMINE HCL 25 MG PO CAPS
50.0000 mg | ORAL_CAPSULE | Freq: Once | ORAL | Status: AC
  Administered 2024-01-01: 50 mg via ORAL
  Filled 2024-01-01: qty 2

## 2024-01-01 MED ORDER — ACETAMINOPHEN 325 MG PO TABS
650.0000 mg | ORAL_TABLET | Freq: Once | ORAL | Status: AC
  Administered 2024-01-01: 650 mg via ORAL
  Filled 2024-01-01: qty 2

## 2024-01-01 MED ORDER — SODIUM CHLORIDE 0.9 % IV SOLN
INTRAVENOUS | Status: DC
  Filled 2024-01-01: qty 250

## 2024-01-01 NOTE — Patient Instructions (Signed)
 [facility] - A DEPT OF [facility]  Discharge Instructions: Thank you for choosing [facility] to provide your oncology and hematology care.  If you have a lab appointment with the Cancer Center, please go directly to the Cancer Center and check in at the registration area.  Wear comfortable clothing and clothing appropriate for easy access to any Portacath or PICC line.   We strive to give you quality time with your provider. You may need to reschedule your appointment if you arrive late (15 or more minutes).  Arriving late affects you and other patients whose appointments are after yours.  Also, if you miss three or more appointments without notifying the office, you may be dismissed from the clinic at the provider's discretion.      For prescription refill requests, have your pharmacy contact our office and allow 72 hours for refills to be completed.    Today you received the following chemotherapy and/or immunotherapy agents RITUXAN      To help prevent nausea and vomiting after your treatment, we encourage you to take your nausea medication as directed.  BELOW ARE SYMPTOMS THAT SHOULD BE REPORTED IMMEDIATELY: *FEVER GREATER THAN 100.4 F (38 C) OR HIGHER *CHILLS OR SWEATING *NAUSEA AND VOMITING THAT IS NOT CONTROLLED WITH YOUR NAUSEA MEDICATION *UNUSUAL SHORTNESS OF BREATH *UNUSUAL BRUISING OR BLEEDING *URINARY PROBLEMS (pain or burning when urinating, or frequent urination) *BOWEL PROBLEMS (unusual diarrhea, constipation, pain near the anus) TENDERNESS IN MOUTH AND THROAT WITH OR WITHOUT PRESENCE OF ULCERS (sore throat, sores in mouth, or a toothache) UNUSUAL RASH, SWELLING OR PAIN  UNUSUAL VAGINAL DISCHARGE OR ITCHING   Items with * indicate a potential emergency and should be followed up as soon as possible or go to the Emergency Department if any problems should occur.  Please show the CHEMOTHERAPY ALERT CARD or IMMUNOTHERAPY  ALERT CARD at check-in to the Emergency Department and triage nurse.  Should you have questions after your visit or need to cancel or reschedule your appointment, please contact [facility] - A DEPT OF [facility]  [phone] and follow the prompts.  Office hours are 8:00 a.m. to 4:30 p.m. Monday - Friday. Please note that voicemails left after 4:00 p.m. may not be returned until the following business day.  We are closed weekends and major holidays. You have access to a nurse at all times for urgent questions. Please call the main number to the clinic [phone] and follow the prompts.  For any non-urgent questions, you may also contact your provider using MyChart. We now offer e-Visits for anyone 71 and older to request care online for non-urgent symptoms. For details visit [email or web address].   Also download the MyChart app! Go to the app store, search "MyChart", open the app, select [place], and log in with your MyChart username and password.  Rituximab Injection What is this medication? RITUXIMAB (ri TUX i mab) treats leukemia and lymphoma. It works by blocking a protein that causes cancer cells to grow and multiply. This helps to slow or stop the spread of cancer cells. It may also be used to treat autoimmune conditions, such as arthritis. It works by slowing down an overactive immune system. It is a monoclonal antibody. This medicine may be used for other purposes; ask your health care provider or pharmacist if you have questions. COMMON BRAND NAME(S): RIABNI, Rituxan, RUXIENCE, truxima What should I tell my care  team before I take this medication? They need to know if you have any of these conditions: Chest pain Heart disease Immune system problems Infection, such as chickenpox, cold sores, hepatitis B, herpes Irregular heartbeat or rhythm Kidney disease Low blood counts, such as low white cells, platelets, red cells Lung disease Recent or  upcoming vaccine An unusual or allergic reaction to rituximab, other medications, foods, dyes, or preservatives Pregnant or trying to get pregnant Breast-feeding How should I use this medication? This medication is injected into a vein. It is given by a care team in a hospital or clinic setting. A special MedGuide will be given to you before each treatment. Be sure to read this information carefully each time. Talk to your care team about the use of this medication in children. While this medication may be prescribed for children as young as 6 months for selected conditions, precautions do apply. Overdosage: If you think you have taken too much of this medicine contact a poison control center or emergency room at once. NOTE: This medicine is only for you. Do not share this medicine with others. What if I miss a dose? Keep appointments for follow-up doses. It is important not to miss your dose. Call your care team if you are unable to keep an appointment. What may interact with this medication? Do not take this medication with any of the following: Live vaccines This medication may also interact with the following: Cisplatin This list may not describe all possible interactions. Give your health care provider a list of all the medicines, herbs, non-prescription drugs, or dietary supplements you use. Also tell them if you smoke, drink alcohol, or use illegal drugs. Some items may interact with your medicine. What should I watch for while using this medication? Your condition will be monitored carefully while you are receiving this medication. You may need blood work while taking this medication. This medication can cause serious infusion reactions. To reduce the risk your care team may give you other medications to take before receiving this one. Be sure to follow the directions from your care team. This medication may increase your risk of getting an infection. Call your care team for advice if  you get a fever, chills, sore throat, or other symptoms of a cold or flu. Do not treat yourself. Try to avoid being around people who are sick. Call your care team if you are around anyone with measles, chickenpox, or if you develop sores or blisters that do not heal properly. Avoid taking medications that contain aspirin, acetaminophen, ibuprofen, naproxen, or ketoprofen unless instructed by your care team. These medications may hide a fever. This medication may cause serious skin reactions. They can happen weeks to months after starting the medication. Contact your care team right away if you notice fevers or flu-like symptoms with a rash. The rash may be red or purple and then turn into blisters or peeling of the skin. You may also notice a red rash with swelling of the face, lips, or lymph nodes in your neck or under your arms. In some patients, this medication may cause a serious brain infection that may cause death. If you have any problems seeing, thinking, speaking, walking, or standing, tell your care team right away. If you cannot reach your care team, urgently seek another source of medical care. Talk to your care team if you may be pregnant. Serious birth defects can occur if you take this medication during pregnancy and for 12 months after the  last dose. You will need a negative pregnancy test before starting this medication. Contraception is recommended while taking this medication and for 12 months after the last dose. Your care team can help you find the option that works for you. Do not breastfeed while taking this medication and for at least 6 months after the last dose. What side effects may I notice from receiving this medication? Side effects that you should report to your care team as soon as possible: Allergic reactions or angioedema--skin rash, itching or hives, swelling of the face, eyes, lips, tongue, arms, or legs, trouble swallowing or breathing Bowel blockage--stomach cramping,  unable to have a bowel movement or pass gas, loss of appetite, vomiting Dizziness, loss of balance or coordination, confusion or trouble speaking Heart attack--pain or tightness in the chest, shoulders, arms, or jaw, nausea, shortness of breath, cold or clammy skin, feeling faint or lightheaded Heart rhythm changes--fast or irregular heartbeat, dizziness, feeling faint or lightheaded, chest pain, trouble breathing Infection--fever, chills, cough, sore throat, wounds that don't heal, pain or trouble when passing urine, general feeling of discomfort or being unwell Infusion reactions--chest pain, shortness of breath or trouble breathing, feeling faint or lightheaded Kidney injury--decrease in the amount of urine, swelling of the ankles, hands, or feet Liver injury--right upper belly pain, loss of appetite, nausea, light-colored stool, dark yellow or brown urine, yellowing skin or eyes, unusual weakness or fatigue Redness, blistering, peeling, or loosening of the skin, including inside the mouth Stomach pain that is severe, does not go away, or gets worse Tumor lysis syndrome (TLS)--nausea, vomiting, diarrhea, decrease in the amount of urine, dark urine, unusual weakness or fatigue, confusion, muscle pain or cramps, fast or irregular heartbeat, joint pain Side effects that usually do not require medical attention (report to your care team if they continue or are bothersome): Headache Joint pain Nausea Runny or stuffy nose Unusual weakness or fatigue This list may not describe all possible side effects. Call your doctor for medical advice about side effects. You may report side effects to FDA at 1-800-FDA-1088. Where should I keep my medication? This medication is given in a hospital or clinic. It will not be stored at home. NOTE: This sheet is a summary. It may not cover all possible information. If you have questions about this medicine, talk to your doctor, pharmacist, or health care provider.   2024 Elsevier/Gold Standard (2021-07-04 00:00:00)

## 2024-01-05 ENCOUNTER — Other Ambulatory Visit: Payer: Self-pay

## 2024-01-12 ENCOUNTER — Other Ambulatory Visit: Payer: Self-pay

## 2024-01-12 ENCOUNTER — Other Ambulatory Visit (HOSPITAL_COMMUNITY): Payer: Self-pay

## 2024-01-12 NOTE — Progress Notes (Signed)
 Specialty Pharmacy Refill Coordination Note  Spoke with Blake Padilla is a 73 y.o. male contacted today regarding refills of specialty medication(s) Acalabrutinib  Maleate ([place] )  Doses on hand: 0  Patient requested: Delivery   Delivery date: 01/14/24   Verified address: [address] [place] [address]  Medication will be filled on 01/13/24 .

## 2024-01-13 ENCOUNTER — Other Ambulatory Visit: Payer: Self-pay

## 2024-01-29 ENCOUNTER — Inpatient Hospital Stay: Admitting: Oncology

## 2024-01-29 ENCOUNTER — Encounter: Payer: Self-pay | Admitting: Oncology

## 2024-01-29 ENCOUNTER — Ambulatory Visit: Payer: Self-pay | Admitting: Oncology

## 2024-01-29 ENCOUNTER — Inpatient Hospital Stay

## 2024-01-29 ENCOUNTER — Inpatient Hospital Stay: Attending: Oncology

## 2024-01-29 VITALS — BP 113/54 | HR 61 | Temp 95.0°F | Resp 19

## 2024-01-29 VITALS — BP 123/56 | HR 84 | Temp 96.0°F | Resp 18 | Wt 227.9 lb

## 2024-01-29 DIAGNOSIS — F419 Anxiety disorder, unspecified: Secondary | ICD-10-CM | POA: Diagnosis not present

## 2024-01-29 DIAGNOSIS — D649 Anemia, unspecified: Secondary | ICD-10-CM | POA: Insufficient documentation

## 2024-01-29 DIAGNOSIS — Z5112 Encounter for antineoplastic immunotherapy: Secondary | ICD-10-CM | POA: Insufficient documentation

## 2024-01-29 DIAGNOSIS — Z7982 Long term (current) use of aspirin: Secondary | ICD-10-CM | POA: Insufficient documentation

## 2024-01-29 DIAGNOSIS — Z79899 Other long term (current) drug therapy: Secondary | ICD-10-CM | POA: Insufficient documentation

## 2024-01-29 DIAGNOSIS — C8312 Mantle cell lymphoma, intrathoracic lymph nodes: Secondary | ICD-10-CM

## 2024-01-29 DIAGNOSIS — Z79624 Long term (current) use of inhibitors of nucleotide synthesis: Secondary | ICD-10-CM | POA: Insufficient documentation

## 2024-01-29 DIAGNOSIS — Z87891 Personal history of nicotine dependence: Secondary | ICD-10-CM | POA: Insufficient documentation

## 2024-01-29 DIAGNOSIS — Z7964 Long term (current) use of myelosuppressive agent: Secondary | ICD-10-CM | POA: Diagnosis not present

## 2024-01-29 DIAGNOSIS — E538 Deficiency of other specified B group vitamins: Secondary | ICD-10-CM

## 2024-01-29 DIAGNOSIS — R7989 Other specified abnormal findings of blood chemistry: Secondary | ICD-10-CM | POA: Insufficient documentation

## 2024-01-29 LAB — CMP (CANCER CENTER ONLY)
ALT: 18 U/L (ref 0–44)
AST: 25 U/L (ref 15–41)
Albumin: 4.1 g/dL (ref 3.5–5.0)
Alkaline Phosphatase: 105 U/L (ref 38–126)
Anion gap: 11 (ref 5–15)
BUN: 22 mg/dL (ref 8–23)
CO2: 23 mmol/L (ref 22–32)
Calcium: 8.9 mg/dL (ref 8.9–10.3)
Chloride: 106 mmol/L (ref 98–111)
Creatinine: 1.61 mg/dL — ABNORMAL HIGH (ref 0.61–1.24)
GFR, Estimated: 45 mL/min — ABNORMAL LOW (ref >60)
Glucose, Bld: 135 mg/dL — ABNORMAL HIGH (ref 70–99)
Potassium: 3.9 mmol/L (ref 3.5–5.1)
Sodium: 140 mmol/L (ref 135–145)
Total Bilirubin: 0.7 mg/dL (ref 0.0–1.2)
Total Protein: 7.2 g/dL (ref 6.5–8.1)

## 2024-01-29 LAB — CBC (CANCER CENTER ONLY)
HCT: 41.1 % (ref 39.0–52.0)
Hemoglobin: 13.3 g/dL (ref 13.0–17.0)
MCH: 28.1 pg (ref 26.0–34.0)
MCHC: 32.4 g/dL (ref 30.0–36.0)
MCV: 86.7 fL (ref 80.0–100.0)
Platelet Count: 149 K/uL — ABNORMAL LOW (ref 150–400)
RBC: 4.74 MIL/uL (ref 4.22–5.81)
RDW: 12.7 % (ref 11.5–15.5)
WBC Count: 6 K/uL (ref 4.0–10.5)
nRBC: 0 % (ref 0.0–0.2)

## 2024-01-29 LAB — VITAMIN B12: Vitamin B-12: 462 pg/mL (ref 180–914)

## 2024-01-29 MED ORDER — SODIUM CHLORIDE 0.9 % IV SOLN
375.0000 mg/m2 | Freq: Once | INTRAVENOUS | Status: AC
  Administered 2024-01-29: 900 mg via INTRAVENOUS
  Filled 2024-01-29: qty 50

## 2024-01-29 MED ORDER — ACETAMINOPHEN 325 MG PO TABS
650.0000 mg | ORAL_TABLET | Freq: Once | ORAL | Status: AC
  Administered 2024-01-29: 650 mg via ORAL
  Filled 2024-01-29: qty 2

## 2024-01-29 MED ORDER — SODIUM CHLORIDE 0.9 % IV SOLN
INTRAVENOUS | Status: DC
  Filled 2024-01-29: qty 250

## 2024-01-29 MED ORDER — LORAZEPAM 2 MG/ML IJ SOLN
0.5000 mg | INTRAMUSCULAR | Status: DC | PRN
  Administered 2024-01-29: 0.5 mg via INTRAVENOUS
  Filled 2024-01-29: qty 1

## 2024-01-29 MED ORDER — DIPHENHYDRAMINE HCL 25 MG PO TABS
50.0000 mg | ORAL_TABLET | Freq: Once | ORAL | Status: AC
  Administered 2024-01-29: 50 mg via ORAL
  Filled 2024-01-29: qty 2

## 2024-01-29 NOTE — Assessment & Plan Note (Addendum)
 improved hemoglobin level. Monitor.

## 2024-01-29 NOTE — Assessment & Plan Note (Addendum)
 He takes oral b12 supplementation. Repeat B12 level has improved. Continue oral B12 1000mcg daily

## 2024-01-29 NOTE — Assessment & Plan Note (Addendum)
 PET scan and biopsy pathology results reviewed and discussed with patient. 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  Will further discuss with pathologist  and add on prognostic factors including IGHV, SOX11, TP53     Normal LDH. MIPI score is 6.5,  high risk. Negative hepatitis and HIV. TP53 negative.  He does not have significant GI symptoms except chronic acid reflux. He defers EGD and colonoscopy to rule out occult GI MCL.  Patient has high MIPI score, low Ki67. Mantle cell lymphoma involving pericardia fat and mediastinal lymph nodes.  Currently on Acalabrutinib  plus Rituximab .  Labs are reviewed and discussed with patient. S/p weekly Rituximab  x 4. Continue monthly x 12 months and subsequently once every 2 months totaling 24 months. Proceed with Rituximab  today Continue Acalabrutinib  100mg  BID today. Recommend shingle prophylaxis with Acyclovir  400mg  daily  He tolerates well.

## 2024-01-29 NOTE — Assessment & Plan Note (Signed)
 Patient will get Ativan  o.5mg  IV PRN anxiety prior to chemo.

## 2024-01-29 NOTE — Assessment & Plan Note (Signed)
 Encourage oral hydration and avoid nephrotoxins.  Check US  renal. Recheck BMP in 1 week.

## 2024-01-29 NOTE — Progress Notes (Addendum)
 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  Will further discuss with pathologist  and add on prognostic factors including IGHV, SOX11, TP53     Normal LDH. MIPI score is 6.5,  high risk. Negative hepatitis and HIV. TP53 negative.  He does not have significant GI symptoms except chronic acid reflux. He defers EGD and colonoscopy to rule out occult GI MCL.  Patient has high MIPI score, low Ki67. Mantle cell lymphoma involving pericardia fat and mediastinal lymph nodes.  Currently on Acalabrutinib  plus Rituximab .  Labs are reviewed and discussed with patient. S/p weekly Rituximab  x 4. Continue monthly x 12 months and subsequently once every 2 months totaling 24 months. Proceed with Rituximab  today Continue Acalabrutinib  100mg  BID today. Recommend shingle prophylaxis with Acyclovir  400mg  daily  He tolerates well.    Anxiety Patient will get Ativan  o.5mg  IV PRN anxiety prior to chemo.   Normocytic anemia improved hemoglobin level. Monitor.   B12 deficiency He takes oral b12 supplementation. Repeat B12 level has improved. Continue oral B12 1000mcg daily  Elevated serum creatinine Encourage oral hydration and avoid nephrotoxins.  Check US  renal. Recheck BMP in 1 week.   He declined influenza vaccination Orders Placed This Encounter  Procedures   CMP (Cancer Center only)    Standing Status:   Future    Expected Date:   03/25/2024    Expiration Date:   03/25/2025   CBC (Cancer Center Only)    Standing Status:   Future    Expected Date:   03/25/2024    Expiration Date:   03/25/2025   Follow up per LOS All questions were answered. The patient knows to  call the clinic with any problems, questions or concerns.  Zelphia Cap, MD, PhD [facility] Hematology Oncology 01/29/2024    HISTORY OF PRESENTING ILLNESS:  Blake Padilla 73 y.o. male presents to establish care for mantle cell lymphoma I have reviewed his chart and materials related to his cancer extensively and collaborated history with the patient. 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 Newly mildly enlarged 1.7 cm right pericardiophrenic node, indeterminate for metastatic disease. Suggest follow-up chest CT with IV contrast in 3 months. Alternatively, PET-CT could be considered at this time. 2. Lung-RADS 2, benign appearance or behavior. Continue annual screening with low-dose chest CT without contrast in 12 months. 3. Two-vessel coronary atherosclerosis. 4. Stable left adrenal adenoma, for which no follow-up imaging is recommended. 5. Aortic Atherosclerosis (ICD10-I70.0) and Emphysema (ICD10-J43.9).   08/03/2023 Imaging   PET scan showed Hypermetabolic mediastinal lymph nodes in the right cardiophrenic angle and retrosternal anterior mediastinum. Differential diagnosis includes lymphoproliferative disorder, inflammatory or infectious etiologies, and less likely metastatic disease.   Stable 5 mm right upper lobe pulmonary nodule, without FDG uptake suggesting benign etiology. Recommend continued attention on follow-up imaging.   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   Pathology showed  A. PERICARDIAL FAT PAD NODULE, EXCISION:  -  Mantle cell lymphoma, see note.   B. PERICARDIAL FAT PAD NODULE, EXCISION:  -  Mantle cell lymphoma, see note.   C. LYMPH NODE,  ANTERIOR MEDIASTINAL, EXCISION:  -  Mantle cell lymphoma, see note.   D. LYMPH NODES, ANTERIOR MEDIASTINAL #2, RESECTION:  -  Mantle cell lymphoma, see note.   The various lymph nodes present in parts a through D show a varied amount of architectural preservation with the obvious presence of retained secondary follicle/germinal centers with evidence of polarization and tingible body macrophages.  In addition, many of the lymph nodes show the preservation of open sinuses while other lymph nodes show loss of sinuses and evidence of extracapsular extension by small lymphocytes.  Overall there is a prominent nodular pattern, which depending on the node is secondary to prominent expansion of the mantle zone cuff up to including a partial diffuse infiltrate of the cortex and paracortex.  The majority of the cells are CD20 positive B cells which include both the cells involve and mantle zone expansion and focal diffuse infiltrate as well as those present in the reactive germinal centers (CD10/BCL6 positive and appropriately Bcl-2 negative CD23 highlights a follicular dendritic cell meshwork) the cells of interest in the expanded mantle zone cuffs and partial diffuse nature are weakly coexpressing CD5 as compared to the CD3/CD43 positive T cells.  In addition they are also strongly positive for nuclear expression of cyclin D1.  Ki-67 shows a normal immunoarchitecture in the areas of germinal centers and is overall less than 5% overall in the diffuse areas and expanded mantle cell cuffs.  Given the patchy nature of the disease the aforementioned immunohistochemical stains were performed on parts B, C and D.  In addition, flow cytometry, performed on part A, identifies 76% of the cells to be lambda restricted CD5 dim positive B cells.  These findings are consistent with mantle cell lymphoma.    10/06/2023 Cancer Staging   Staging form: Hodgkin and Non-Hodgkin Lymphoma, AJCC 8th Edition - Clinical stage from  10/06/2023: Stage II (Mantle cell lymphoma) - Signed by Babara Call, MD on 10/06/2023 Stage prefix: Initial diagnosis T(11;18)(q21;q21): Unknown HCV: Unknown   11/12/2023 -  Chemotherapy   Acalabrutinib  100 mg orally twice a day and intravenous rituximab , planweekly for first 4 weeks, followed by once a month for 12 months and subsequently once every 2 months totaling 24 months.     Patient has history of COPD, mild shortness of breath with exertion..  he remains active for his age.  He plays golf 3-4 times a week.  Former smoker, quit in October 2022. Patient denies any GI symptoms except chronic acid reflux which is controlled with PPI.  Denies any unintentional weight loss, night sweats or fever.  INTERVAL HISTORY Blake Padilla is a 73 y.o. male who has above history reviewed by me today presents for follow up visit for Mantle cell lymphoma    He is accompanied by wife. He tolerated Rituximab  and acalabrutinib  well.  He has no new concerns.   MEDICAL HISTORY:  Past Medical History:  Diagnosis Date   Anxiety    in small places   Arthritis    COPD (chronic obstructive pulmonary disease) (HCC)    Pt denies   Coronary artery disease    GERD (gastroesophageal reflux disease)    Hypertension    Iliac aneurysm 01/16/2021   Panic attacks    Peripheral vascular disease    Iliac Artery Aneurysm Repair    SURGICAL HISTORY: Past Surgical  History:  Procedure Laterality Date   CATARACT EXTRACTION W/ INTRAOCULAR LENS IMPLANT Right    ENDOVASCULAR STENT GRAFT (AAA) N/A 01/16/2021   Procedure: ENDOVASCULAR REPAIR/STENT GRAFT;  Surgeon: Marea Selinda RAMAN, MD;  Location: [facility] IN[facility]ASIVE [facility];  Service: Cardiovascular;  Laterality: N/A;   EXCISION, MASS, MEDIASTINUM, ROBOT-ASSISTED Right 09/14/2023   Procedure: EXCISION, MASS, MEDIASTINUM, ROBOT-ASSISTED;  Surgeon: Kerrin Elspeth BROCKS, MD;  Location: [facility] OR;  Service: Thoracic;  Laterality: Right;  RIGHT ROBOTIC RESECTION OF PERICARDIAL FAT PAD AND  MEDIASTINAL LYMPH NODES   ILIAC ARTERY ANEURYSM REPAIR  01/16/2021   INTERCOSTAL NERVE BLOCK N/A 09/14/2023   Procedure: BLOCK, NERVE, INTERCOSTAL;  Surgeon: Kerrin Elspeth BROCKS, MD;  Location: [facility] OR;  Service: Thoracic;  Laterality: N/A;   TOTAL HIP ARTHROPLASTY Left 06/11/2021   Procedure: TOTAL HIP ARTHROPLASTY ANTERIOR APPROACH;  Surgeon: Kathlynn Sharper, MD;  Location: [facility] ORS;  Service: Orthopedics;  Laterality: Left;    SOCIAL HISTORY: Social History   Socioeconomic History   Marital status: Married    Spouse name: Susie   Number of children: 1   Years of education: Not on file   Highest education level: Not on file  Occupational History   Not on file  Tobacco Use   Smoking status: Former    Current packs/day: 0.00    Average packs/day: 1 pack/day for 52.8 years (52.8 ttl pk-yrs)    Types: Cigarettes    Start date: 89    Quit date: 12/13/2021    Years since quitting: 2.1   Smokeless tobacco: Never   Tobacco comments:    Quit smoking 11/2021  Vaping Use   Vaping status: Never Used  Substance and Sexual Activity   Alcohol use: Not Currently    Comment: with colds   Drug use: Never   Sexual activity: Yes  Other Topics Concern   Not on file  Social History Narrative   Not on file   Social Drivers of Health   Financial Resource Strain: Low Risk  (10/06/2023)   Overall Financial Resource Strain (CARDIA)    Difficulty of Paying Living Expenses: Not very hard  Food Insecurity: No Food Insecurity (10/06/2023)   Hunger Vital Sign    Worried About Running Out of Food in the Last Year: Never true    Ran Out of Food in the Last Year: Never true  Transportation Needs: No Transportation Needs (10/06/2023)   PRAPARE - Administrator, Civil Service (Medical): No    Lack of Transportation (Non-Medical): No  Physical Activity: Not on file  Stress: No Stress Concern Present (10/06/2023)   [employer] of Occupational Health - Occupational Stress Questionnaire     Feeling of Stress: Not at all  Social Connections: Socially Integrated (09/14/2023)   Social Connection and Isolation Panel    Frequency of Communication with Friends and Family: Three times a week    Frequency of Social Gatherings with Friends and Family: Once a week    Attends Religious Services: More than 4 times per year    Active Member of [employer] or Organizations: Yes    Attends Banker Meetings: 1 to 4 times per year    Marital Status: Married  Catering Manager Violence: Not At Risk (10/06/2023)   Humiliation, Afraid, Rape, and Kick questionnaire    Fear of Current or Ex-Partner: No    Emotionally Abused: No    Physically Abused: No    Sexually Abused: No    FAMILY HISTORY: Family History  Problem  Relation Age of Onset   Diabetes Mother    Hypertension Mother    Diabetes Father    Hypertension Father    Leukemia Sister    Diabetes Brother    Dementia Sister    COPD Sister    Healthy Sister     ALLERGIES:  has no known allergies.  MEDICATIONS:  Current Outpatient Medications  Medication Sig Dispense Refill   acalabrutinib  maleate (CALQUENCE ) 100 MG tablet Take 1 tablet (100 mg total) by mouth 2 (two) times daily. 60 tablet 1   acyclovir  (ZOVIRAX ) 400 MG tablet Take 1 tablet (400 mg total) by mouth daily. 90 tablet 3   amLODipine  (NORVASC ) 5 MG tablet Take 1 tablet (5 mg total) by mouth daily. 30 tablet 3   aspirin  EC 81 MG EC tablet Take 1 tablet (81 mg total) by mouth daily. Swallow whole. 30 tablet 11   cetirizine (ZYRTEC) 10 MG tablet Take 10 mg by mouth daily.     ezetimibe  (ZETIA ) 10 MG tablet Take 1 tablet (10 mg total) by mouth daily. 90 tablet 3   NEXIUM 40 MG capsule Take 40 mg by mouth daily as needed (indigestion / heart burn).     pravastatin  (PRAVACHOL ) 20 MG tablet Take 1 tablet (20 mg total) by mouth daily.     No current facility-administered medications for this visit.    Review of Systems  Constitutional:  Negative for appetite  change, chills, fatigue, fever and unexpected weight change.  HENT:   Negative for hearing loss and voice change.   Eyes:  Negative for eye problems and icterus.  Respiratory:  Negative for chest tightness, cough and shortness of breath.   Cardiovascular:  Negative for chest pain and leg swelling.  Gastrointestinal:  Negative for abdominal distention and abdominal pain.  Endocrine: Negative for hot flashes.  Genitourinary:  Negative for difficulty urinating, dysuria and frequency.   Musculoskeletal:  Negative for arthralgias.  Skin:  Negative for itching and rash.  Neurological:  Negative for light-headedness and numbness.  Hematological:  Negative for adenopathy. Does not bruise/bleed easily.  Psychiatric/Behavioral:  Negative for confusion.      PHYSICAL EXAMINATION: ECOG PERFORMANCE STATUS: 1 - Symptomatic but completely ambulatory  Vitals:   01/29/24 0853  BP: (!) 123/56  Pulse: 84  Resp: 18  Temp: (!) 96 F (35.6 C)  SpO2: 96%   Filed Weights   01/29/24 0853  Weight: 227 lb 14.4 oz (103.4 kg)    Physical Exam Constitutional:      General: He is not in acute distress.    Appearance: He is not diaphoretic.  HENT:     Head: Normocephalic and atraumatic.     Nose: Nose normal.  Eyes:     General: No scleral icterus.    Pupils: Pupils are equal, round, and reactive to light.  Cardiovascular:     Rate and Rhythm: Normal rate and regular rhythm.     Heart sounds: No murmur heard. Pulmonary:     Effort: Pulmonary effort is normal. No respiratory distress.     Breath sounds: No wheezing.  Abdominal:     General: There is no distension.     Palpations: Abdomen is soft.     Tenderness: There is no abdominal tenderness.  Musculoskeletal:        General: Normal range of motion.     Cervical back: Normal range of motion and neck supple.  Skin:    General: Skin is warm and dry.  Findings: No erythema.  Neurological:     Mental Status: He is alert and oriented to  person, place, and time. Mental status is at baseline.     Cranial Nerves: No cranial nerve deficit.     Motor: No abnormal muscle tone.  Psychiatric:        Mood and Affect: Mood and affect normal.      LABORATORY DATA:  I have reviewed the data as listed    Latest Ref Rng & Units 01/29/2024    8:44 AM 12/17/2023    8:43 AM 12/03/2023    8:43 AM  CBC  WBC 4.0 - 10.5 K/uL 6.0  5.3  6.2   Hemoglobin 13.0 - 17.0 g/dL 86.6  88.5  88.5   Hematocrit 39.0 - 52.0 % 41.1  35.5  34.4   Platelets 150 - 400 K/uL 149  175  167       Latest Ref Rng & Units 01/29/2024    8:45 AM 12/17/2023    8:43 AM 12/03/2023    8:43 AM  CMP  Glucose 70 - 99 mg/dL 864  880  873   BUN 8 - 23 mg/dL 22  18  17    Creatinine 0.61 - 1.24 mg/dL 8.38  8.75  8.89   Sodium 135 - 145 mmol/L 140  137  140   Potassium 3.5 - 5.1 mmol/L 3.9  3.9  3.9   Chloride 98 - 111 mmol/L 106  107  108   CO2 22 - 32 mmol/L 23  23  25    Calcium  8.9 - 10.3 mg/dL 8.9  8.6  8.8   Total Protein 6.5 - 8.1 g/dL 7.2  7.1  7.0   Total Bilirubin 0.0 - 1.2 mg/dL 0.7  0.9  0.8   Alkaline Phos 38 - 126 U/L 105  97  90   AST 15 - 41 U/L 25  16  14    ALT 0 - 44 U/L 18  12  10       RADIOGRAPHIC STUDIES: I have personally reviewed the radiological images as listed and agreed with the findings in the report. No results found.

## 2024-01-29 NOTE — Addendum Note (Signed)
 Addended by: BABARA CALL on: 01/29/2024 04:59 PM   Modules accepted: Level of Service

## 2024-02-01 ENCOUNTER — Encounter: Payer: Self-pay | Admitting: Oncology

## 2024-02-01 NOTE — Telephone Encounter (Signed)
-----   Message from Zelphia Cap sent at 01/29/2024  6:53 PM EST ----- Please let pt know that his kidney function is worse. Recommend him to increase hydration and repeat bmp next week. thanks ----- Message ----- From: Rebecka, Lab [facility] [place] Sent: 01/29/2024   8:55 AM EST To: Zelphia Cap, MD

## 2024-02-01 NOTE — Telephone Encounter (Signed)
 Called and left VM informing pt of worsening kidney function. Asked for pt to call back and schedule lab appt this week.   When pt calls back, he will need:   Lab only (bmp) this week

## 2024-02-04 ENCOUNTER — Inpatient Hospital Stay

## 2024-02-04 DIAGNOSIS — C8312 Mantle cell lymphoma, intrathoracic lymph nodes: Secondary | ICD-10-CM

## 2024-02-04 DIAGNOSIS — Z5112 Encounter for antineoplastic immunotherapy: Secondary | ICD-10-CM | POA: Diagnosis not present

## 2024-02-04 LAB — BASIC METABOLIC PANEL - CANCER CENTER ONLY
Anion gap: 10 (ref 5–15)
BUN: 20 mg/dL (ref 8–23)
CO2: 27 mmol/L (ref 22–32)
Calcium: 9.5 mg/dL (ref 8.9–10.3)
Chloride: 105 mmol/L (ref 98–111)
Creatinine: 1.72 mg/dL — ABNORMAL HIGH (ref 0.61–1.24)
GFR, Estimated: 41 mL/min — ABNORMAL LOW (ref >60)
Glucose, Bld: 135 mg/dL — ABNORMAL HIGH (ref 70–99)
Potassium: 4.1 mmol/L (ref 3.5–5.1)
Sodium: 142 mmol/L (ref 135–145)

## 2024-02-05 ENCOUNTER — Other Ambulatory Visit (HOSPITAL_COMMUNITY): Payer: Self-pay

## 2024-02-08 ENCOUNTER — Other Ambulatory Visit: Payer: Self-pay

## 2024-02-09 ENCOUNTER — Other Ambulatory Visit: Payer: Self-pay

## 2024-02-10 ENCOUNTER — Other Ambulatory Visit: Payer: Self-pay

## 2024-02-11 ENCOUNTER — Other Ambulatory Visit: Payer: Self-pay

## 2024-02-11 NOTE — Progress Notes (Signed)
 Specialty Pharmacy Ongoing Clinical Assessment Note  Blake Padilla is a 73 y.o. male who is being followed by the specialty pharmacy service for RxSp Oncology   Patient's specialty medication(s) reviewed today: Acalabrutinib  Maleate (Calquence )   Missed doses in the last 4 weeks: 0   Patient/Caregiver did not have any additional questions or concerns.   Therapeutic benefit summary: Patient is achieving benefit   Adverse events/side effects summary: No adverse events/side effects   Patient's therapy is appropriate to: Continue    Goals Addressed             This Visit's Progress    Achieve or maintain remission       Patient is unable to be assessed as therapy was recently initiated. Patient will maintain adherence         Follow up: 3 months  [facility] Specialty Pharmacist

## 2024-02-11 NOTE — Progress Notes (Signed)
 Specialty Pharmacy Refill Coordination Note  Blake Padilla is a 73 y.o. male contacted today regarding refills of specialty medication(s) Acalabrutinib  Maleate (Calquence )   Patient requested Delivery   Delivery date: 02/12/24   Verified address: [address] [place] [address]   Medication will be filled on: 02/11/24

## 2024-02-14 ENCOUNTER — Ambulatory Visit: Payer: Self-pay | Admitting: Oncology

## 2024-02-14 DIAGNOSIS — R7989 Other specified abnormal findings of blood chemistry: Secondary | ICD-10-CM

## 2024-02-15 NOTE — Telephone Encounter (Signed)
-----   Message from Zelphia Cap, MD sent at 02/14/2024  9:05 PM EST ----- Please arrange pt to get US  renal ASAP.

## 2024-02-15 NOTE — Telephone Encounter (Signed)
 Called and left detailed VM on pt's ph letting him know that Dr. Babara is recommending a renal US  ASAP for further eval. Number to US  dept provided in message.

## 2024-02-16 NOTE — Progress Notes (Signed)
 US  scheduled for 12/24

## 2024-02-17 ENCOUNTER — Ambulatory Visit
Admission: RE | Admit: 2024-02-17 | Discharge: 2024-02-17 | Disposition: A | Discharge status: Home / Self Care | Source: Ambulatory Visit | Attending: Oncology | Admitting: Oncology

## 2024-02-17 DIAGNOSIS — K760 Fatty (change of) liver, not elsewhere classified: Secondary | ICD-10-CM | POA: Insufficient documentation

## 2024-02-17 DIAGNOSIS — R7989 Other specified abnormal findings of blood chemistry: Secondary | ICD-10-CM | POA: Insufficient documentation

## 2024-02-26 ENCOUNTER — Inpatient Hospital Stay

## 2024-02-26 ENCOUNTER — Inpatient Hospital Stay: Attending: Oncology

## 2024-02-26 ENCOUNTER — Inpatient Hospital Stay: Admitting: Oncology

## 2024-02-26 ENCOUNTER — Inpatient Hospital Stay: Admitting: Nurse Practitioner

## 2024-02-26 VITALS — BP 132/66 | HR 77 | Temp 96.7°F | Resp 18

## 2024-02-26 VITALS — BP 135/77 | HR 78 | Temp 96.1°F | Resp 18 | Wt 232.6 lb

## 2024-02-26 DIAGNOSIS — Z7982 Long term (current) use of aspirin: Secondary | ICD-10-CM | POA: Diagnosis not present

## 2024-02-26 DIAGNOSIS — D696 Thrombocytopenia, unspecified: Secondary | ICD-10-CM | POA: Diagnosis not present

## 2024-02-26 DIAGNOSIS — Z79620 Long term (current) use of immunosuppressive biologic: Secondary | ICD-10-CM | POA: Insufficient documentation

## 2024-02-26 DIAGNOSIS — Z5112 Encounter for antineoplastic immunotherapy: Secondary | ICD-10-CM

## 2024-02-26 DIAGNOSIS — Z806 Family history of leukemia: Secondary | ICD-10-CM | POA: Diagnosis not present

## 2024-02-26 DIAGNOSIS — Z79624 Long term (current) use of inhibitors of nucleotide synthesis: Secondary | ICD-10-CM | POA: Insufficient documentation

## 2024-02-26 DIAGNOSIS — D649 Anemia, unspecified: Secondary | ICD-10-CM | POA: Insufficient documentation

## 2024-02-26 DIAGNOSIS — Z87891 Personal history of nicotine dependence: Secondary | ICD-10-CM | POA: Diagnosis not present

## 2024-02-26 DIAGNOSIS — D701 Agranulocytosis secondary to cancer chemotherapy: Secondary | ICD-10-CM | POA: Diagnosis not present

## 2024-02-26 DIAGNOSIS — E538 Deficiency of other specified B group vitamins: Secondary | ICD-10-CM | POA: Insufficient documentation

## 2024-02-26 DIAGNOSIS — Z79899 Other long term (current) drug therapy: Secondary | ICD-10-CM | POA: Insufficient documentation

## 2024-02-26 DIAGNOSIS — C8312 Mantle cell lymphoma, intrathoracic lymph nodes: Secondary | ICD-10-CM | POA: Insufficient documentation

## 2024-02-26 DIAGNOSIS — T451X5A Adverse effect of antineoplastic and immunosuppressive drugs, initial encounter: Secondary | ICD-10-CM

## 2024-02-26 DIAGNOSIS — R7989 Other specified abnormal findings of blood chemistry: Secondary | ICD-10-CM

## 2024-02-26 DIAGNOSIS — F419 Anxiety disorder, unspecified: Secondary | ICD-10-CM | POA: Insufficient documentation

## 2024-02-26 LAB — DIFFERENTIAL
Abs Immature Granulocytes: 0 K/uL (ref 0.00–0.07)
Basophils Absolute: 0 K/uL (ref 0.0–0.1)
Basophils Relative: 1 %
Eosinophils Absolute: 0.1 K/uL (ref 0.0–0.5)
Eosinophils Relative: 4 %
Immature Granulocytes: 0 %
Lymphocytes Relative: 53 %
Lymphs Abs: 1.8 K/uL (ref 0.7–4.0)
Monocytes Absolute: 0.9 K/uL (ref 0.1–1.0)
Monocytes Relative: 26 %
Neutro Abs: 0.5 K/uL — ABNORMAL LOW (ref 1.7–7.7)
Neutrophils Relative %: 16 %

## 2024-02-26 LAB — CMP (CANCER CENTER ONLY)
ALT: 14 U/L (ref 0–44)
AST: 22 U/L (ref 15–41)
Albumin: 4 g/dL (ref 3.5–5.0)
Alkaline Phosphatase: 137 U/L — ABNORMAL HIGH (ref 38–126)
Anion gap: 11 (ref 5–15)
BUN: 17 mg/dL (ref 8–23)
CO2: 24 mmol/L (ref 22–32)
Calcium: 9.2 mg/dL (ref 8.9–10.3)
Chloride: 106 mmol/L (ref 98–111)
Creatinine: 1.22 mg/dL (ref 0.61–1.24)
GFR, Estimated: >60 mL/min
Glucose, Bld: 128 mg/dL — ABNORMAL HIGH (ref 70–99)
Potassium: 4.5 mmol/L (ref 3.5–5.1)
Sodium: 141 mmol/L (ref 135–145)
Total Bilirubin: 0.5 mg/dL (ref 0.0–1.2)
Total Protein: 7.1 g/dL (ref 6.5–8.1)

## 2024-02-26 LAB — CBC (CANCER CENTER ONLY)
HCT: 39 % (ref 39.0–52.0)
Hemoglobin: 12.4 g/dL — ABNORMAL LOW (ref 13.0–17.0)
MCH: 27.6 pg (ref 26.0–34.0)
MCHC: 31.8 g/dL (ref 30.0–36.0)
MCV: 86.9 fL (ref 80.0–100.0)
Platelet Count: 181 K/uL (ref 150–400)
RBC: 4.49 MIL/uL (ref 4.22–5.81)
RDW: 13 % (ref 11.5–15.5)
WBC Count: 3.3 K/uL — ABNORMAL LOW (ref 4.0–10.5)
nRBC: 0 % (ref 0.0–0.2)

## 2024-02-26 MED ORDER — LORAZEPAM 2 MG/ML IJ SOLN
0.5000 mg | INTRAMUSCULAR | Status: DC | PRN
  Administered 2024-02-26: 0.5 mg via INTRAVENOUS
  Filled 2024-02-26: qty 1

## 2024-02-26 MED ORDER — DIPHENHYDRAMINE HCL 25 MG PO TABS
50.0000 mg | ORAL_TABLET | Freq: Once | ORAL | Status: AC
  Administered 2024-02-26: 50 mg via ORAL
  Filled 2024-02-26: qty 2

## 2024-02-26 MED ORDER — SODIUM CHLORIDE 0.9 % IV SOLN
375.0000 mg/m2 | Freq: Once | INTRAVENOUS | Status: AC
  Administered 2024-02-26: 900 mg via INTRAVENOUS
  Filled 2024-02-26: qty 40

## 2024-02-26 MED ORDER — ACETAMINOPHEN 325 MG PO TABS
650.0000 mg | ORAL_TABLET | Freq: Once | ORAL | Status: AC
  Administered 2024-02-26: 650 mg via ORAL
  Filled 2024-02-26: qty 2

## 2024-02-26 MED ORDER — SODIUM CHLORIDE 0.9 % IV SOLN
INTRAVENOUS | Status: DC
  Filled 2024-02-26: qty 250

## 2024-02-26 NOTE — Progress Notes (Signed)
 " Hematology/Oncology Progress Note [phone] [phone]      CHIEF COMPLAINTS/PURPOSE OF CONSULTATION:  Mantle cell lymphoma  HISTORY OF PRESENTING ILLNESS:  Blake Padilla 74 y.o. male presents to establish care for mantle cell lymphoma  Summary of oncologic history is as follows: Oncology History  Mantle cell lymphoma (HCC)  06/09/2023 Imaging   CT chest lung cancer screening showed 1. Newly mildly enlarged 1.7 cm right pericardiophrenic node, indeterminate for metastatic disease. Suggest follow-up chest CT with IV contrast in 3 months. Alternatively, PET-CT could be considered at this time. 2. Lung-RADS 2, benign appearance or behavior. Continue annual screening with low-dose chest CT without contrast in 12 months. 3. Two-vessel coronary atherosclerosis. 4. Stable left adrenal adenoma, for which no follow-up imaging is recommended. 5. Aortic Atherosclerosis (ICD10-I70.0) and Emphysema (ICD10-J43.9).   08/03/2023 Imaging   PET scan showed Hypermetabolic mediastinal lymph nodes in the right cardiophrenic angle and retrosternal anterior mediastinum. Differential diagnosis includes lymphoproliferative disorder, inflammatory or infectious etiologies, and less likely metastatic disease.   Stable 5 mm right upper lobe pulmonary nodule, without FDG uptake suggesting benign etiology. Recommend continued attention on follow-up imaging.   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   Pathology showed  A. PERICARDIAL FAT PAD NODULE, EXCISION:  -  Mantle cell lymphoma, see note.   B. PERICARDIAL FAT PAD NODULE, EXCISION:  -  Mantle cell lymphoma, see note.   C. LYMPH NODE, ANTERIOR MEDIASTINAL, EXCISION:  -  Mantle cell lymphoma, see note.   D. LYMPH NODES, ANTERIOR MEDIASTINAL #2,  RESECTION:  -  Mantle cell lymphoma, see note.   The various lymph nodes present in parts a through D show a varied amount of architectural preservation with the obvious presence of retained secondary follicle/germinal centers with evidence of polarization and tingible body macrophages.  In addition, many of the lymph nodes show the preservation of open sinuses while other lymph nodes show loss of sinuses and evidence of extracapsular extension by small lymphocytes.  Overall there is a prominent nodular pattern, which depending on the node is secondary to prominent expansion of the mantle zone cuff up to including a partial diffuse infiltrate of the cortex and paracortex.  The majority of the cells are CD20 positive B cells which include both the cells involve and mantle zone expansion and focal diffuse infiltrate as well as those present in the reactive germinal centers (CD10/BCL6 positive and appropriately Bcl-2 negative CD23 highlights a follicular dendritic cell meshwork) the cells of interest in the expanded mantle zone cuffs and partial diffuse nature are weakly coexpressing CD5 as compared to the CD3/CD43 positive T cells.  In addition they are also strongly positive for nuclear expression of cyclin D1.  Ki-67 shows a normal immunoarchitecture in the areas of germinal centers and is overall less than 5% overall in the diffuse areas and expanded mantle cell cuffs.  Given the patchy nature of the disease the aforementioned immunohistochemical stains were performed on parts B, C and D.  In addition, flow cytometry, performed on part A, identifies 76% of the cells to be lambda restricted CD5 dim positive B cells.  These findings are consistent with mantle cell lymphoma.    10/06/2023 Cancer Staging   Staging form: Hodgkin and Non-Hodgkin Lymphoma, AJCC 8th Edition - Clinical stage from 10/06/2023: Stage II (Mantle cell lymphoma) - Signed by Babara Call,  MD on 10/06/2023 Stage prefix: Initial  diagnosis T(11;18)(q21;q21): Unknown HCV: Unknown   11/12/2023 -  Chemotherapy   Acalabrutinib  100 mg orally twice a day and intravenous rituximab , planweekly for first 4 weeks, followed by once a month for 12 months and subsequently once every 2 months totaling 24 months.     Patient has history of COPD, mild shortness of breath with exertion..  he remains active for his age.  He plays golf 3-4 times a week.  Former smoker, quit in October 2022. Patient denies any GI symptoms except chronic acid reflux which is controlled with PPI.  Denies any unintentional weight loss, night sweats or fever.  INTERVAL HISTORY Blake Padilla is a 74 y.o. male who has above history of mantle cell lymphoma, currently on rituximab  and acalabrutinib , to clinic.  For follow-up and consideration of continuation of therapy.  He continues to feel well and says that he is tolerating treatment well without significant side effects.No fevers or chills. No diarrhea, muscle aches and pains. Denies abnormal bleeding or bruising. Denies infections or sick exposures. Accompanied by his wife. He is trying to increase his fluid intake.    MEDICAL HISTORY:  Past Medical History:  Diagnosis Date   Anxiety    in small places   Arthritis    COPD (chronic obstructive pulmonary disease) (HCC)    Pt denies   Coronary artery disease    GERD (gastroesophageal reflux disease)    Hypertension    Iliac aneurysm 01/16/2021   Panic attacks    Peripheral vascular disease    Iliac Artery Aneurysm Repair    SURGICAL HISTORY: Past Surgical History:  Procedure Laterality Date   CATARACT EXTRACTION W/ INTRAOCULAR LENS IMPLANT Right    ENDOVASCULAR STENT GRAFT (AAA) N/A 01/16/2021   Procedure: ENDOVASCULAR REPAIR/STENT GRAFT;  Surgeon: Marea Selinda RAMAN, MD;  Location: [facility];  Service: Cardiovascular;  Laterality: N/A;   EXCISION, MASS, MEDIASTINUM, ROBOT-ASSISTED Right 09/14/2023   Procedure: EXCISION, MASS,  MEDIASTINUM, ROBOT-ASSISTED;  Surgeon: Kerrin Elspeth JAYSON, MD;  Location: [facility] OR;  Service: Thoracic;  Laterality: Right;  RIGHT ROBOTIC RESECTION OF PERICARDIAL FAT PAD AND MEDIASTINAL LYMPH NODES   ILIAC ARTERY ANEURYSM REPAIR  01/16/2021   INTERCOSTAL NERVE BLOCK N/A 09/14/2023   Procedure: BLOCK, NERVE, INTERCOSTAL;  Surgeon: Kerrin Elspeth JAYSON, MD;  Location: [facility] OR;  Service: Thoracic;  Laterality: N/A;   TOTAL HIP ARTHROPLASTY Left 06/11/2021   Procedure: TOTAL HIP ARTHROPLASTY ANTERIOR APPROACH;  Surgeon: Kathlynn Sharper, MD;  Location: [facility] OR[facility];  Service: Orthopedics;  Laterality: Left;    SOCIAL HISTORY: Social History   Socioeconomic History   Marital status: Married    Spouse name: Susie   Number of children: 1   Years of education: Not on file   Highest education level: Not on file  Occupational History   Not on file  Tobacco Use   Smoking status: Former    Current packs/day: 0.00    Average packs/day: 1 pack/day for 52.8 years (52.8 ttl pk-yrs)    Types: Cigarettes    Start date: 52    Quit date: 12/13/2021    Years since quitting: 2.2   Smokeless tobacco: Never   Tobacco comments:    Quit smoking 11/2021  Vaping Use   Vaping status: Never Used  Substance and Sexual Activity   Alcohol use: Not Currently    Comment: with colds   Drug use: Never   Sexual activity: Yes  Other Topics Concern   Not  on file  Social History Narrative   Not on file   Social Drivers of Health   Tobacco Use: Medium Risk (01/29/2024)   Patient History    Smoking Tobacco Use: Former    Smokeless Tobacco Use: Never    Passive Exposure: Not on file  Financial Resource Strain: Low Risk (10/06/2023)   Overall Financial Resource Strain (CARDIA)    Difficulty of Paying Living Expenses: Not very hard  Food Insecurity: No Food Insecurity (10/06/2023)   Epic    Worried About Programme Researcher, Broadcasting/film/video in the Last Year: Never true    Ran Out of Food in the Last Year: Never true   Transportation Needs: No Transportation Needs (10/06/2023)   Epic    Lack of Transportation (Medical): No    Lack of Transportation (Non-Medical): No  Physical Activity: Not on file  Stress: No Stress Concern Present (10/06/2023)   [employer] of Occupational Health - Occupational Stress Questionnaire    Feeling of Stress: Not at all  Social Connections: Socially Integrated (09/14/2023)   Social Connection and Isolation Panel    Frequency of Communication with Friends and Family: Three times a week    Frequency of Social Gatherings with Friends and Family: Once a week    Attends Religious Services: More than 4 times per year    Active Member of [employer] or Organizations: Yes    Attends Banker Meetings: 1 to 4 times per year    Marital Status: Married  Catering Manager Violence: Not At Risk (10/06/2023)   Epic    Fear of Current or Ex-Partner: No    Emotionally Abused: No    Physically Abused: No    Sexually Abused: No  Depression (PHQ2-9): Low Risk (02/26/2024)   Depression (PHQ2-9)    PHQ-2 Score: 0  Alcohol Screen: Not on file  Housing: Low Risk (10/06/2023)   Epic    Unable to Pay for Housing in the Last Year: No    Number of Times Moved in the Last Year: 0    Homeless in the Last Year: No  Utilities: Not At Risk (10/06/2023)   Epic    Threatened with loss of utilities: No  Health Literacy: Adequate Health Literacy (10/06/2023)   B1300 Health Literacy    Frequency of need for help with medical instructions: Rarely    FAMILY HISTORY: Family History  Problem Relation Age of Onset   Diabetes Mother    Hypertension Mother    Diabetes Father    Hypertension Father    Leukemia Sister    Diabetes Brother    Dementia Sister    COPD Sister    Healthy Sister     ALLERGIES:  has no known allergies.  MEDICATIONS:  Current Outpatient Medications  Medication Sig Dispense Refill   acalabrutinib  maleate (CALQUENCE ) 100 MG tablet Take 1 tablet (100 mg total) by  mouth 2 (two) times daily. 60 tablet 1   acyclovir  (ZOVIRAX ) 400 MG tablet Take 1 tablet (400 mg total) by mouth daily. 90 tablet 3   amLODipine  (NORVASC ) 5 MG tablet Take 1 tablet (5 mg total) by mouth daily. 30 tablet 3   aspirin  EC 81 MG EC tablet Take 1 tablet (81 mg total) by mouth daily. Swallow whole. 30 tablet 11   cetirizine (ZYRTEC) 10 MG tablet Take 10 mg by mouth daily.     ezetimibe  (ZETIA ) 10 MG tablet Take 1 tablet (10 mg total) by mouth daily. 90 tablet 3   NEXIUM 40  MG capsule Take 40 mg by mouth daily as needed (indigestion / heart burn).     pravastatin  (PRAVACHOL ) 20 MG tablet Take 1 tablet (20 mg total) by mouth daily.     No current facility-administered medications for this visit.   Facility-Administered Medications Ordered in Other Visits  Medication Dose Route Frequency Provider Last Rate Last Admin   0.9 %  sodium chloride  infusion   Intravenous Continuous Babara Call, MD   Stopped at 02/26/24 1411   LORazepam  (ATIVAN ) injection 0.5 mg  0.5 mg Intravenous PRN Yu, Zhou, MD   0.5 mg at 02/26/24 1005    Review of Systems  Constitutional:  Negative for appetite change, chills, fatigue, fever and unexpected weight change.  HENT:   Negative for hearing loss and voice change.   Eyes:  Negative for eye problems and icterus.  Respiratory:  Negative for chest tightness, cough and shortness of breath.   Cardiovascular:  Negative for chest pain and leg swelling.  Gastrointestinal:  Negative for abdominal distention and abdominal pain.  Endocrine: Negative for hot flashes.  Genitourinary:  Negative for difficulty urinating, dysuria and frequency.   Musculoskeletal:  Negative for arthralgias.  Skin:  Negative for itching and rash.  Neurological:  Negative for light-headedness and numbness.  Hematological:  Negative for adenopathy. Does not bruise/bleed easily.  Psychiatric/Behavioral:  Negative for confusion.      PHYSICAL EXAMINATION: ECOG PERFORMANCE STATUS: 1 -  Symptomatic but completely ambulatory  Vitals:   02/26/24 0923  BP: 135/77  Pulse: 78  Resp: 18  Temp: (!) 96.1 F (35.6 C)  SpO2: 100%   Filed Weights   02/26/24 0920  Weight: 232 lb 9.6 oz (105.5 kg)   Physical Exam Constitutional:      General: He is not in acute distress. HENT:     Head: Normocephalic and atraumatic.  Eyes:     General: No scleral icterus. Cardiovascular:     Rate and Rhythm: Normal rate and regular rhythm.  Pulmonary:     Effort: Pulmonary effort is normal. No respiratory distress.     Breath sounds: No wheezing.  Abdominal:     General: There is no distension.     Palpations: Abdomen is soft.     Tenderness: There is no abdominal tenderness.  Musculoskeletal:        General: No deformity. Normal range of motion.  Skin:    General: Skin is warm and dry.     Findings: No erythema or rash.  Neurological:     Mental Status: He is alert and oriented to person, place, and time.     Motor: No abnormal muscle tone.  Psychiatric:        Mood and Affect: Mood and affect normal.        Behavior: Behavior normal.      LABORATORY DATA:  I have reviewed the data as listed    Latest Ref Rng & Units 02/26/2024    9:00 AM 02/26/2024    8:56 AM 01/29/2024    8:44 AM  CBC EXTENDED  WBC 4.0 - 10.5 K/uL  3.3  6.0   RBC 4.22 - 5.81 MIL/uL  4.49  4.74   Hemoglobin 13.0 - 17.0 g/dL  87.5  86.6   HCT 60.9 - 52.0 %  39.0  41.1   Platelets 150 - 400 K/uL  181  149   NEUT# 1.7 - 7.7 K/uL 0.5     Lymph# 0.7 - 4.0 K/uL 1.8  Latest Ref Rng & Units 02/26/2024    8:56 AM 02/04/2024    8:54 AM 01/29/2024    8:45 AM  CMP  Glucose 70 - 99 mg/dL 871  864  864   BUN 8 - 23 mg/dL 17  20  22    Creatinine 0.61 - 1.24 mg/dL 8.77  8.27  8.38   Sodium 135 - 145 mmol/L 141  142  140   Potassium 3.5 - 5.1 mmol/L 4.5  4.1  3.9   Chloride 98 - 111 mmol/L 106  105  106   CO2 22 - 32 mmol/L 24  27  23    Calcium  8.9 - 10.3 mg/dL 9.2  9.5  8.9   Total Protein 6.5 - 8.1  g/dL 7.1   7.2   Total Bilirubin 0.0 - 1.2 mg/dL 0.5   0.7   Alkaline Phos 38 - 126 U/L 137   105   AST 15 - 41 U/L 22   25   ALT 0 - 44 U/L 14   18     RADIOGRAPHIC STUDIES: I have personally reviewed the radiological images as listed and agreed with the findings in the report. US  RENAL Result Date: 02/18/2024 EXAM: US  Retroperitoneum Complete, Renal. 02/17/2024 09:04:08 AM TECHNIQUE: Real-time ultrasonography of the retroperitoneum renal was performed. COMPARISON: 01/01/2021 CLINICAL HISTORY: Elevated creatinine. FINDINGS: LIVER: Increased echotexture of the liver. RIGHT KIDNEY/URETER: Right kidney measures 11.7 x 4.2 x 6.4 cm. Normal cortical echogenicity. No hydronephrosis. No calculus. No mass. Small interpolar region cyst measuring 1.3 x 0.9 cm. LEFT KIDNEY/URETER: Left kidney measures 13.3 x 6.6 x 5.5 cm. Normal cortical echogenicity. No hydronephrosis. No calculus. No mass. Left renal cyst on the prior CT was not well visualized on today's ultrasound. BLADDER: The urinary bladder is completely decompressed. IMPRESSION: 1. No hydronephrosis or nephrolithiasis. 2. Hepatic steatosis. 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  Pognostic factors including IGHV, SOX11, TP53 per Dr Babara.  Normal LDH. MIPI score is 6.5,  high risk. Negative hepatitis and HIV. TP53 negative. - He did not have significant GI symptoms beyond chronic acid reflux and deferred EGD and colonoscopy.  - S/p  weekly Rituximab  x 4 and now receiving monthly rituximab  x 12 then once every 2 months for total of 24 months.  - Labs today reviewed- cbc showed wbc 3.3. Add differential. ANC 0.5. Ok for rituximab  today. Plan to hold acalabrutinib . Neutropenic precautions  reviewed. Hold off on prophylactic antibiotics. He will continue shingles prophylaxis with acyclovir  400 mg daily.  - I will update Dr Babara regarding rechecking counts and when to restart acalabrutinib .    Anxiety Patient will get Ativan  0.5mg  IV PRN anxiety prior to chemo.  - tolerating well   Normocytic anemia - hmg 12.4. stable. Monitor.    B12 deficiency He takes oral b12 supplementation. Repeat B12 level has improved. Continue oral B12 1000mcg daily   Elevated serum creatinine Encourage oral hydration and avoid nephrotoxins.  REnal ultrasound reviewed with out any apparent pathology contributing to elevated creatinine. Improved with increased fluid intake. Continue.    He declined influenza vaccination  Disposition:  Rituximab  today F/u per IS- la   All questions were answered. The patient knows to call the clinic with any  problems, questions or concerns.  Tinnie Dawn, DNP, AGNP-C, AOCNP [facility] [phone] (clinic) 02/26/2024  "

## 2024-02-26 NOTE — Patient Instructions (Signed)
 [facility] - A DEPT OF [facility]  Discharge Instructions: Thank you for choosing [facility] to provide your oncology and hematology care.  If you have a lab appointment with the Cancer Center, please go directly to the Cancer Center and check in at the registration area.  Wear comfortable clothing and clothing appropriate for easy access to any Portacath or PICC line.   We strive to give you quality time with your provider. You may need to reschedule your appointment if you arrive late (15 or more minutes).  Arriving late affects you and other patients whose appointments are after yours.  Also, if you miss three or more appointments without notifying the office, you may be dismissed from the clinic at the provider's discretion.      For prescription refill requests, have your pharmacy contact our office and allow 72 hours for refills to be completed.    Today you received the following chemotherapy and/or immunotherapy agents RITUXAN      To help prevent nausea and vomiting after your treatment, we encourage you to take your nausea medication as directed.  BELOW ARE SYMPTOMS THAT SHOULD BE REPORTED IMMEDIATELY: *FEVER GREATER THAN 100.4 F (38 C) OR HIGHER *CHILLS OR SWEATING *NAUSEA AND VOMITING THAT IS NOT CONTROLLED WITH YOUR NAUSEA MEDICATION *UNUSUAL SHORTNESS OF BREATH *UNUSUAL BRUISING OR BLEEDING *URINARY PROBLEMS (pain or burning when urinating, or frequent urination) *BOWEL PROBLEMS (unusual diarrhea, constipation, pain near the anus) TENDERNESS IN MOUTH AND THROAT WITH OR WITHOUT PRESENCE OF ULCERS (sore throat, sores in mouth, or a toothache) UNUSUAL RASH, SWELLING OR PAIN  UNUSUAL VAGINAL DISCHARGE OR ITCHING   Items with * indicate a potential emergency and should be followed up as soon as possible or go to the Emergency Department if any problems should occur.  Please show the CHEMOTHERAPY ALERT CARD or IMMUNOTHERAPY  ALERT CARD at check-in to the Emergency Department and triage nurse.  Should you have questions after your visit or need to cancel or reschedule your appointment, please contact [facility] - A DEPT OF [facility]  [phone] and follow the prompts.  Office hours are 8:00 a.m. to 4:30 p.m. Monday - Friday. Please note that voicemails left after 4:00 p.m. may not be returned until the following business day.  We are closed weekends and major holidays. You have access to a nurse at all times for urgent questions. Please call the main number to the clinic [phone] and follow the prompts.  For any non-urgent questions, you may also contact your provider using MyChart. We now offer e-Visits for anyone 71 and older to request care online for non-urgent symptoms. For details visit [email or web address].   Also download the MyChart app! Go to the app store, search "MyChart", open the app, select [place], and log in with your MyChart username and password.  Rituximab Injection What is this medication? RITUXIMAB (ri TUX i mab) treats leukemia and lymphoma. It works by blocking a protein that causes cancer cells to grow and multiply. This helps to slow or stop the spread of cancer cells. It may also be used to treat autoimmune conditions, such as arthritis. It works by slowing down an overactive immune system. It is a monoclonal antibody. This medicine may be used for other purposes; ask your health care provider or pharmacist if you have questions. COMMON BRAND NAME(S): RIABNI, Rituxan, RUXIENCE, truxima What should I tell my care  team before I take this medication? They need to know if you have any of these conditions: Chest pain Heart disease Immune system problems Infection, such as chickenpox, cold sores, hepatitis B, herpes Irregular heartbeat or rhythm Kidney disease Low blood counts, such as low white cells, platelets, red cells Lung disease Recent or  upcoming vaccine An unusual or allergic reaction to rituximab, other medications, foods, dyes, or preservatives Pregnant or trying to get pregnant Breast-feeding How should I use this medication? This medication is injected into a vein. It is given by a care team in a hospital or clinic setting. A special MedGuide will be given to you before each treatment. Be sure to read this information carefully each time. Talk to your care team about the use of this medication in children. While this medication may be prescribed for children as young as 6 months for selected conditions, precautions do apply. Overdosage: If you think you have taken too much of this medicine contact a poison control center or emergency room at once. NOTE: This medicine is only for you. Do not share this medicine with others. What if I miss a dose? Keep appointments for follow-up doses. It is important not to miss your dose. Call your care team if you are unable to keep an appointment. What may interact with this medication? Do not take this medication with any of the following: Live vaccines This medication may also interact with the following: Cisplatin This list may not describe all possible interactions. Give your health care provider a list of all the medicines, herbs, non-prescription drugs, or dietary supplements you use. Also tell them if you smoke, drink alcohol, or use illegal drugs. Some items may interact with your medicine. What should I watch for while using this medication? Your condition will be monitored carefully while you are receiving this medication. You may need blood work while taking this medication. This medication can cause serious infusion reactions. To reduce the risk your care team may give you other medications to take before receiving this one. Be sure to follow the directions from your care team. This medication may increase your risk of getting an infection. Call your care team for advice if  you get a fever, chills, sore throat, or other symptoms of a cold or flu. Do not treat yourself. Try to avoid being around people who are sick. Call your care team if you are around anyone with measles, chickenpox, or if you develop sores or blisters that do not heal properly. Avoid taking medications that contain aspirin, acetaminophen, ibuprofen, naproxen, or ketoprofen unless instructed by your care team. These medications may hide a fever. This medication may cause serious skin reactions. They can happen weeks to months after starting the medication. Contact your care team right away if you notice fevers or flu-like symptoms with a rash. The rash may be red or purple and then turn into blisters or peeling of the skin. You may also notice a red rash with swelling of the face, lips, or lymph nodes in your neck or under your arms. In some patients, this medication may cause a serious brain infection that may cause death. If you have any problems seeing, thinking, speaking, walking, or standing, tell your care team right away. If you cannot reach your care team, urgently seek another source of medical care. Talk to your care team if you may be pregnant. Serious birth defects can occur if you take this medication during pregnancy and for 12 months after the  last dose. You will need a negative pregnancy test before starting this medication. Contraception is recommended while taking this medication and for 12 months after the last dose. Your care team can help you find the option that works for you. Do not breastfeed while taking this medication and for at least 6 months after the last dose. What side effects may I notice from receiving this medication? Side effects that you should report to your care team as soon as possible: Allergic reactions or angioedema--skin rash, itching or hives, swelling of the face, eyes, lips, tongue, arms, or legs, trouble swallowing or breathing Bowel blockage--stomach cramping,  unable to have a bowel movement or pass gas, loss of appetite, vomiting Dizziness, loss of balance or coordination, confusion or trouble speaking Heart attack--pain or tightness in the chest, shoulders, arms, or jaw, nausea, shortness of breath, cold or clammy skin, feeling faint or lightheaded Heart rhythm changes--fast or irregular heartbeat, dizziness, feeling faint or lightheaded, chest pain, trouble breathing Infection--fever, chills, cough, sore throat, wounds that don't heal, pain or trouble when passing urine, general feeling of discomfort or being unwell Infusion reactions--chest pain, shortness of breath or trouble breathing, feeling faint or lightheaded Kidney injury--decrease in the amount of urine, swelling of the ankles, hands, or feet Liver injury--right upper belly pain, loss of appetite, nausea, light-colored stool, dark yellow or brown urine, yellowing skin or eyes, unusual weakness or fatigue Redness, blistering, peeling, or loosening of the skin, including inside the mouth Stomach pain that is severe, does not go away, or gets worse Tumor lysis syndrome (TLS)--nausea, vomiting, diarrhea, decrease in the amount of urine, dark urine, unusual weakness or fatigue, confusion, muscle pain or cramps, fast or irregular heartbeat, joint pain Side effects that usually do not require medical attention (report to your care team if they continue or are bothersome): Headache Joint pain Nausea Runny or stuffy nose Unusual weakness or fatigue This list may not describe all possible side effects. Call your doctor for medical advice about side effects. You may report side effects to FDA at 1-800-FDA-1088. Where should I keep my medication? This medication is given in a hospital or clinic. It will not be stored at home. NOTE: This sheet is a summary. It may not cover all possible information. If you have questions about this medicine, talk to your doctor, pharmacist, or health care provider.   2024 Elsevier/Gold Standard (2021-07-04 00:00:00)

## 2024-02-28 ENCOUNTER — Other Ambulatory Visit: Payer: Self-pay

## 2024-02-29 ENCOUNTER — Other Ambulatory Visit: Payer: Self-pay

## 2024-02-29 DIAGNOSIS — C8312 Mantle cell lymphoma, intrathoracic lymph nodes: Secondary | ICD-10-CM

## 2024-02-29 NOTE — Progress Notes (Signed)
 Called pt and was sent straight to VM. Detailed message left letting pt know to continue to HOLD Calquence  until his lab is rechecked. Asked him to call back to schedule a lab (cbc) appt either Friday or early next week.

## 2024-03-01 NOTE — Progress Notes (Signed)
 Spoke to pt and he is scheduled for 1/12 @ 2pm. Advised to continue to hold Calquence  until told otherwise. Pt verbalized understanding.

## 2024-03-04 ENCOUNTER — Other Ambulatory Visit: Payer: Self-pay | Admitting: Oncology

## 2024-03-04 ENCOUNTER — Inpatient Hospital Stay
Admission: EM | Admit: 2024-03-04 | Discharge: 2024-03-08 | DRG: 809 | Disposition: A | Discharge status: Home / Self Care | Source: Ambulatory Visit | Attending: Student | Admitting: Student

## 2024-03-04 ENCOUNTER — Other Ambulatory Visit: Payer: Self-pay

## 2024-03-04 ENCOUNTER — Other Ambulatory Visit (HOSPITAL_COMMUNITY): Payer: Self-pay

## 2024-03-04 ENCOUNTER — Emergency Department

## 2024-03-04 DIAGNOSIS — E1152 Type 2 diabetes mellitus with diabetic peripheral angiopathy with gangrene: Secondary | ICD-10-CM | POA: Diagnosis present

## 2024-03-04 DIAGNOSIS — I130 Hypertensive heart and chronic kidney disease with heart failure and stage 1 through stage 4 chronic kidney disease, or unspecified chronic kidney disease: Secondary | ICD-10-CM | POA: Diagnosis present

## 2024-03-04 DIAGNOSIS — Z806 Family history of leukemia: Secondary | ICD-10-CM | POA: Diagnosis not present

## 2024-03-04 DIAGNOSIS — I10 Essential (primary) hypertension: Secondary | ICD-10-CM | POA: Diagnosis not present

## 2024-03-04 DIAGNOSIS — I739 Peripheral vascular disease, unspecified: Secondary | ICD-10-CM | POA: Diagnosis present

## 2024-03-04 DIAGNOSIS — C8310 Mantle cell lymphoma, unspecified site: Secondary | ICD-10-CM | POA: Diagnosis present

## 2024-03-04 DIAGNOSIS — N1831 Chronic kidney disease, stage 3a: Secondary | ICD-10-CM | POA: Diagnosis present

## 2024-03-04 DIAGNOSIS — K219 Gastro-esophageal reflux disease without esophagitis: Secondary | ICD-10-CM | POA: Diagnosis present

## 2024-03-04 DIAGNOSIS — Z87891 Personal history of nicotine dependence: Secondary | ICD-10-CM

## 2024-03-04 DIAGNOSIS — Z8249 Family history of ischemic heart disease and other diseases of the circulatory system: Secondary | ICD-10-CM

## 2024-03-04 DIAGNOSIS — Z7982 Long term (current) use of aspirin: Secondary | ICD-10-CM

## 2024-03-04 DIAGNOSIS — Z825 Family history of asthma and other chronic lower respiratory diseases: Secondary | ICD-10-CM | POA: Diagnosis not present

## 2024-03-04 DIAGNOSIS — F32A Depression, unspecified: Secondary | ICD-10-CM | POA: Diagnosis present

## 2024-03-04 DIAGNOSIS — Z96642 Presence of left artificial hip joint: Secondary | ICD-10-CM | POA: Diagnosis present

## 2024-03-04 DIAGNOSIS — I2510 Atherosclerotic heart disease of native coronary artery without angina pectoris: Secondary | ICD-10-CM | POA: Diagnosis present

## 2024-03-04 DIAGNOSIS — R5081 Fever presenting with conditions classified elsewhere: Secondary | ICD-10-CM | POA: Diagnosis present

## 2024-03-04 DIAGNOSIS — E663 Overweight: Secondary | ICD-10-CM | POA: Diagnosis present

## 2024-03-04 DIAGNOSIS — Z833 Family history of diabetes mellitus: Secondary | ICD-10-CM | POA: Diagnosis not present

## 2024-03-04 DIAGNOSIS — Z79899 Other long term (current) drug therapy: Secondary | ICD-10-CM | POA: Diagnosis not present

## 2024-03-04 DIAGNOSIS — R0981 Nasal congestion: Secondary | ICD-10-CM | POA: Diagnosis present

## 2024-03-04 DIAGNOSIS — J449 Chronic obstructive pulmonary disease, unspecified: Secondary | ICD-10-CM | POA: Diagnosis present

## 2024-03-04 DIAGNOSIS — Z8679 Personal history of other diseases of the circulatory system: Secondary | ICD-10-CM

## 2024-03-04 DIAGNOSIS — E785 Hyperlipidemia, unspecified: Secondary | ICD-10-CM | POA: Diagnosis present

## 2024-03-04 DIAGNOSIS — D709 Neutropenia, unspecified: Secondary | ICD-10-CM | POA: Diagnosis present

## 2024-03-04 DIAGNOSIS — I5032 Chronic diastolic (congestive) heart failure: Secondary | ICD-10-CM | POA: Diagnosis present

## 2024-03-04 DIAGNOSIS — Z6829 Body mass index (BMI) 29.0-29.9, adult: Secondary | ICD-10-CM

## 2024-03-04 DIAGNOSIS — C8312 Mantle cell lymphoma, intrathoracic lymph nodes: Secondary | ICD-10-CM

## 2024-03-04 LAB — URINALYSIS, W/ REFLEX TO CULTURE (INFECTION SUSPECTED)
Bacteria, UA: NONE SEEN
Bilirubin Urine: NEGATIVE
Glucose, UA: NEGATIVE mg/dL
Hgb urine dipstick: NEGATIVE
Ketones, ur: NEGATIVE mg/dL
Leukocytes,Ua: NEGATIVE
Nitrite: NEGATIVE
Protein, ur: 30 mg/dL — AB
Specific Gravity, Urine: 1.028 (ref 1.005–1.030)
pH: 5 (ref 5.0–8.0)

## 2024-03-04 LAB — CBC WITH DIFFERENTIAL/PLATELET
Abs Immature Granulocytes: 0 K/uL (ref 0.00–0.07)
Basophils Absolute: 0 K/uL (ref 0.0–0.1)
Basophils Relative: 1 %
Eosinophils Absolute: 0 K/uL (ref 0.0–0.5)
Eosinophils Relative: 1 %
HCT: 35.9 % — ABNORMAL LOW (ref 39.0–52.0)
Hemoglobin: 11.7 g/dL — ABNORMAL LOW (ref 13.0–17.0)
Immature Granulocytes: 0 %
Lymphocytes Relative: 40 %
Lymphs Abs: 1 K/uL (ref 0.7–4.0)
MCH: 27.6 pg (ref 26.0–34.0)
MCHC: 32.6 g/dL (ref 30.0–36.0)
MCV: 84.7 fL (ref 80.0–100.0)
Monocytes Absolute: 1.5 K/uL — ABNORMAL HIGH (ref 0.1–1.0)
Monocytes Relative: 58 %
Neutro Abs: 0 K/uL — CL (ref 1.7–7.7)
Neutrophils Relative %: 0 %
Platelets: 192 K/uL (ref 150–400)
RBC: 4.24 MIL/uL (ref 4.22–5.81)
RDW: 13.2 % (ref 11.5–15.5)
Smear Review: NORMAL
WBC: 2.6 K/uL — ABNORMAL LOW (ref 4.0–10.5)
nRBC: 0 % (ref 0.0–0.2)

## 2024-03-04 LAB — COMPREHENSIVE METABOLIC PANEL WITH GFR
ALT: 9 U/L (ref 0–44)
AST: 26 U/L (ref 15–41)
Albumin: 3.9 g/dL (ref 3.5–5.0)
Alkaline Phosphatase: 99 U/L (ref 38–126)
Anion gap: 13 (ref 5–15)
BUN: 17 mg/dL (ref 8–23)
CO2: 21 mmol/L — ABNORMAL LOW (ref 22–32)
Calcium: 8.7 mg/dL — ABNORMAL LOW (ref 8.9–10.3)
Chloride: 104 mmol/L (ref 98–111)
Creatinine, Ser: 1.38 mg/dL — ABNORMAL HIGH (ref 0.61–1.24)
GFR, Estimated: 54 mL/min — ABNORMAL LOW
Glucose, Bld: 135 mg/dL — ABNORMAL HIGH (ref 70–99)
Potassium: 3.9 mmol/L (ref 3.5–5.1)
Sodium: 138 mmol/L (ref 135–145)
Total Bilirubin: 1.2 mg/dL (ref 0.0–1.2)
Total Protein: 7.3 g/dL (ref 6.5–8.1)

## 2024-03-04 LAB — RESP PANEL BY RT-PCR (RSV, FLU A&B, COVID)  RVPGX2
Influenza A by PCR: NEGATIVE
Influenza B by PCR: NEGATIVE
Resp Syncytial Virus by PCR: NEGATIVE
SARS Coronavirus 2 by RT PCR: NEGATIVE

## 2024-03-04 LAB — PROTIME-INR
INR: 1.1 (ref 0.8–1.2)
Prothrombin Time: 14.4 s (ref 11.4–15.2)

## 2024-03-04 LAB — LACTIC ACID, PLASMA: Lactic Acid, Venous: 0.7 mmol/L (ref 0.5–1.9)

## 2024-03-04 LAB — PRO BRAIN NATRIURETIC PEPTIDE: Pro Brain Natriuretic Peptide: 194 pg/mL

## 2024-03-04 MED ORDER — PRAVASTATIN SODIUM 20 MG PO TABS
20.0000 mg | ORAL_TABLET | Freq: Every day | ORAL | Status: DC
  Administered 2024-03-05 – 2024-03-07 (×3): 20 mg via ORAL
  Filled 2024-03-04 (×3): qty 1

## 2024-03-04 MED ORDER — AMLODIPINE BESYLATE 5 MG PO TABS
5.0000 mg | ORAL_TABLET | Freq: Every day | ORAL | Status: DC
  Administered 2024-03-05 – 2024-03-08 (×4): 5 mg via ORAL
  Filled 2024-03-04 (×4): qty 1

## 2024-03-04 MED ORDER — PANTOPRAZOLE SODIUM 40 MG PO TBEC
40.0000 mg | DELAYED_RELEASE_TABLET | Freq: Every day | ORAL | Status: DC
  Administered 2024-03-05 – 2024-03-08 (×4): 40 mg via ORAL
  Filled 2024-03-04 (×4): qty 1

## 2024-03-04 MED ORDER — SODIUM CHLORIDE 0.9 % IV SOLN
2.0000 g | Freq: Three times a day (TID) | INTRAVENOUS | Status: DC
  Administered 2024-03-05 – 2024-03-08 (×10): 2 g via INTRAVENOUS
  Filled 2024-03-04 (×11): qty 12.5

## 2024-03-04 MED ORDER — HYDRALAZINE HCL 20 MG/ML IJ SOLN: 5.0000 mg | INTRAMUSCULAR | Status: DC | PRN

## 2024-03-04 MED ORDER — SODIUM CHLORIDE 0.9 % IV SOLN: 2.0000 g | INTRAVENOUS | Status: DC

## 2024-03-04 MED ORDER — SODIUM CHLORIDE 0.9 % IV SOLN
2.0000 g | Freq: Once | INTRAVENOUS | Status: AC
  Administered 2024-03-04: 2 g via INTRAVENOUS
  Filled 2024-03-04: qty 12.5

## 2024-03-04 MED ORDER — LORATADINE 10 MG PO TABS
10.0000 mg | ORAL_TABLET | Freq: Every day | ORAL | Status: DC
  Administered 2024-03-05 – 2024-03-08 (×4): 10 mg via ORAL
  Filled 2024-03-04 (×4): qty 1

## 2024-03-04 MED ORDER — ONDANSETRON HCL 4 MG/2ML IJ SOLN: 4.0000 mg | Freq: Three times a day (TID) | INTRAMUSCULAR | Status: DC | PRN

## 2024-03-04 MED ORDER — ALBUTEROL SULFATE HFA 108 (90 BASE) MCG/ACT IN AERS: 2.0000 | INHALATION_SPRAY | RESPIRATORY_TRACT | Status: DC | PRN

## 2024-03-04 MED ORDER — VANCOMYCIN HCL IN DEXTROSE 1-5 GM/200ML-% IV SOLN: 1000.0000 mg | Freq: Once | INTRAVENOUS | Status: DC

## 2024-03-04 MED ORDER — ALBUTEROL SULFATE (2.5 MG/3ML) 0.083% IN NEBU: 2.5000 mg | INHALATION_SOLUTION | RESPIRATORY_TRACT | Status: DC | PRN

## 2024-03-04 MED ORDER — VANCOMYCIN HCL 2000 MG/400ML IV SOLN
2000.0000 mg | INTRAVENOUS | Status: DC
  Filled 2024-03-04: qty 400

## 2024-03-04 MED ORDER — METRONIDAZOLE 500 MG/100ML IV SOLN
500.0000 mg | Freq: Once | INTRAVENOUS | Status: AC
  Administered 2024-03-05: 500 mg via INTRAVENOUS
  Filled 2024-03-04: qty 100

## 2024-03-04 MED ORDER — FILGRASTIM-AAFI 480 MCG/0.8ML IJ SOSY
480.0000 ug | PREFILLED_SYRINGE | Freq: Every day | INTRAMUSCULAR | Status: AC
  Administered 2024-03-05 – 2024-03-06 (×3): 480 ug via SUBCUTANEOUS
  Filled 2024-03-04 (×4): qty 0.8

## 2024-03-04 MED ORDER — EZETIMIBE 10 MG PO TABS
10.0000 mg | ORAL_TABLET | Freq: Every day | ORAL | Status: DC
  Administered 2024-03-05 – 2024-03-08 (×4): 10 mg via ORAL
  Filled 2024-03-04 (×4): qty 1

## 2024-03-04 MED ORDER — VANCOMYCIN HCL 2000 MG/400ML IV SOLN
2000.0000 mg | Freq: Once | INTRAVENOUS | Status: AC
  Administered 2024-03-05: 2000 mg via INTRAVENOUS
  Filled 2024-03-04 (×2): qty 400

## 2024-03-04 MED ORDER — ENOXAPARIN SODIUM 40 MG/0.4ML IJ SOSY
40.0000 mg | PREFILLED_SYRINGE | INTRAMUSCULAR | Status: DC
  Administered 2024-03-05 – 2024-03-08 (×4): 40 mg via SUBCUTANEOUS
  Filled 2024-03-04 (×4): qty 0.4

## 2024-03-04 MED ORDER — ACYCLOVIR 400 MG PO TABS
400.0000 mg | ORAL_TABLET | Freq: Every day | ORAL | Status: DC
  Filled 2024-03-04: qty 1

## 2024-03-04 MED ORDER — DM-GUAIFENESIN ER 30-600 MG PO TB12: 1.0000 | ORAL_TABLET | Freq: Two times a day (BID) | ORAL | Status: DC | PRN

## 2024-03-04 MED ORDER — SODIUM CHLORIDE 0.9 % IV SOLN: INTRAVENOUS | Status: DC

## 2024-03-04 MED ORDER — ACETAMINOPHEN 325 MG PO TABS
650.0000 mg | ORAL_TABLET | Freq: Four times a day (QID) | ORAL | Status: DC | PRN
  Administered 2024-03-05: 650 mg via ORAL
  Filled 2024-03-04: qty 2

## 2024-03-04 MED ORDER — SODIUM CHLORIDE 0.9 % IV BOLUS
1000.0000 mL | Freq: Once | INTRAVENOUS | Status: AC
  Administered 2024-03-04: 1000 mL via INTRAVENOUS

## 2024-03-04 MED ORDER — ASPIRIN 81 MG PO TBEC
81.0000 mg | DELAYED_RELEASE_TABLET | Freq: Every day | ORAL | Status: DC
  Administered 2024-03-05 – 2024-03-08 (×4): 81 mg via ORAL
  Filled 2024-03-04 (×4): qty 1

## 2024-03-04 NOTE — Sepsis Progress Note (Signed)
 Secure chat to bedside RN to verify that antibiotics were not given in ED & are now being initiated on the floor.

## 2024-03-04 NOTE — H&P (Signed)
 " History and Physical    Blake Padilla FMW:[id number] DOB: December 27, 1950 DOA: 03/04/2024  Referring MD/NP/PA:   PCP: [place] HERO, MD   Patient coming from:  The patient is coming from home.     Chief Complaint: Fever  HPI: Blake Padilla is a 74 y.o. 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  Patient has chills and fever in the past 3 days with temperature up to 102 at home.  Patient has some mild intermittent sore throat, no cough, SOB, chest pain.  No sick contact.  No nausea, vomiting, diarrhea or abdominal pain.  No symptoms UTI.    Patient is sent to ED by her oncologist, Dr. Babara for neutropenia fever. Dr. Babara recommended broad spectrum antibiotics and short acting GCSF daily x 3, and hold off Acalabrutinib .  Data reviewed independently and ED Course: pt was found to have WBC 2.6 with neutrophil 0, slightly worsening renal function, negative UA, negative PCR for COVID, flu and RSV, lactic acid 0.7.  Temperature 99.6 in ED, blood pressure 124/68, HR 77, RR 18, oxygen saturation 97% on room air.  Chest x-ray clear.  Patient is admitted to telemetry bed as inpatient.   EKG: I have personally reviewed.  Sinus rhythm, QTc 451, no ischemic change.  Review of Systems:   General: has fevers, chills, no body weight gain, has fatigue HEENT: no blurry vision, hearing changes, has sore throat Respiratory: no dyspnea, coughing, wheezing CV: no chest pain, no palpitations GI: no nausea, vomiting, abdominal pain, diarrhea, constipation GU: no dysuria, burning on urination, increased urinary frequency, hematuria  Ext: no leg edema Neuro: no unilateral weakness, numbness, or tingling, no vision change or hearing loss Skin: no rash, no skin tear. MSK: No muscle spasm, no deformity, no limitation of range of movement in  spin Heme: No easy bruising.  Travel history: No recent long distant travel.   Allergy: Allergies[1]  Past Medical History:  Diagnosis Date   Anxiety    in small places   Arthritis    COPD (chronic obstructive pulmonary disease) (HCC)    Pt denies   Coronary artery disease    GERD (gastroesophageal reflux disease)    Hypertension    Iliac aneurysm 01/16/2021   Panic attacks    Peripheral vascular disease    Iliac Artery Aneurysm Repair    Past Surgical History:  Procedure Laterality Date   CATARACT EXTRACTION W/ INTRAOCULAR LENS IMPLANT Right    ENDOVASCULAR STENT GRAFT (AAA) N/A 01/16/2021   Procedure: ENDOVASCULAR REPAIR/STENT GRAFT;  Surgeon: Marea Selinda RAMAN, MD;  Location: [facility]IVE [facility];  Service: Cardiovascular;  Laterality: N/A;   EXCISION, MASS, MEDIASTINUM, ROBOT-ASSISTED Right 09/14/2023   Procedure: EXCISION, MASS, MEDIASTINUM, ROBOT-ASSISTED;  Surgeon: Kerrin Elspeth JAYSON, MD;  Location: [facility] OR;  Service: Thoracic;  Laterality: Right;  RIGHT ROBOTIC RESECTION OF PERICARDIAL FAT PAD AND MEDIASTINAL LYMPH NODES   ILIAC ARTERY ANEURYSM REPAIR  01/16/2021   INTERCOSTAL NERVE BLOCK N/A 09/14/2023   Procedure: BLOCK, NERVE, INTERCOSTAL;  Surgeon: Kerrin Elspeth JAYSON, MD;  Location: [facility] OR;  Service: Thoracic;  Laterality: N/A;   TOTAL HIP ARTHROPLASTY Left 06/11/2021   Procedure: TOTAL HIP ARTHROPLASTY ANTERIOR APPROACH;  Surgeon: Kathlynn Sharper, MD;  Location: [facility] ORS;  Service: Orthopedics;  Laterality: Left;    Social History:  reports that he quit smoking about 2  years ago. His smoking use included cigarettes. He started smoking about 55 years ago. He has a 52.8 pack-year smoking history. He has never used smokeless tobacco. He reports that he does not currently use alcohol. He reports that he does not use drugs.  Family History:  Family History  Problem Relation Age of Onset   Diabetes Mother    Hypertension Mother    Diabetes Father    Hypertension Father     Leukemia Sister    Diabetes Brother    Dementia Sister    COPD Sister    Healthy Sister      Prior to Admission medications  Medication Sig Start Date End Date Taking? Authorizing Provider  acalabrutinib  maleate (CALQUENCE ) 100 MG tablet Take 1 tablet (100 mg total) by mouth 2 (two) times daily. 12/31/23   Babara Call, MD  acyclovir  (ZOVIRAX ) 400 MG tablet Take 1 tablet (400 mg total) by mouth daily. 12/17/23   Babara Call, MD  amLODipine  (NORVASC ) 5 MG tablet Take 1 tablet (5 mg total) by mouth daily. 03/25/21   Darliss Rogue, MD  aspirin  EC 81 MG EC tablet Take 1 tablet (81 mg total) by mouth daily. Swallow whole. 01/18/21   Serene Gaile ORN, MD  cetirizine (ZYRTEC) 10 MG tablet Take 10 mg by mouth daily. 08/18/22   [provider]  ezetimibe  (ZETIA ) 10 MG tablet Take 1 tablet (10 mg total) by mouth daily. 02/10/22 02/26/24  Darliss Rogue, MD  NEXIUM 40 MG capsule Take 40 mg by mouth daily as needed (indigestion / heart burn). 09/05/22   [provider]  pravastatin  (PRAVACHOL ) 20 MG tablet Take 1 tablet (20 mg total) by mouth daily. 09/15/23   Viviane Lemond BRAVO, PA-C    Physical Exam: Vitals:   03/04/24 1907 03/04/24 1908 03/04/24 1912 03/04/24 2159  BP:   124/68 137/70  Pulse:    77  Resp:   18 18  Temp: 99.6 F (37.6 C)   98 F (36.7 C)  TempSrc: Oral   Oral  SpO2:   97% 98%  Weight:  106.1 kg    Height:  6' 3 (1.905 m)     General: Not in acute distress HEENT:       Eyes: PERRL, EOMI, no jaundice       ENT: No discharge from the ears and nose, no pharynx injection, no tonsillar enlargement.        Neck: No JVD, no bruit, no mass felt. Heme: No neck lymph node enlargement. Cardiac: S1/S2, RRR, No murmurs, No gallops or rubs. Respiratory: No rales, wheezing, rhonchi or rubs. GI: Soft, nondistended, nontender, no rebound pain, no organomegaly, BS present. GU: No hematuria Ext: No pitting leg edema bilaterally. 1+DP/PT pulse bilaterally. Musculoskeletal:  No joint deformities, No joint redness or warmth, no limitation of ROM in spin. Skin: No rashes.  Neuro: Alert, oriented X3, cranial nerves II-XII grossly intact, moves all extremities normally. Psych: Patient is not psychotic, no suicidal or hemocidal ideation.  Labs on Admission: I have personally reviewed following labs and imaging studies  CBC: Recent Labs  Lab 03/04/24 1912  WBC 2.6*  NEUTROABS 0.0*  HGB 11.7*  HCT 35.9*  MCV 84.7  PLT 192   Basic Metabolic Panel: Recent Labs  Lab 03/04/24 1912  NA 138  K 3.9  CL 104  CO2 21*  GLUCOSE 135*  BUN 17  CREATININE 1.38*  CALCIUM  8.7*   GFR: Estimated Creatinine Clearance: 61.8 mL/min (A) (by C-G formula based on  SCr of 1.38 mg/dL (H)). Liver Function Tests: Recent Labs  Lab 03/04/24 1912  AST 26  ALT 9  ALKPHOS 99  BILITOT 1.2  PROT 7.3  ALBUMIN 3.9   No results for input(s): LIPASE, AMYLASE in the last 168 hours. No results for input(s): AMMONIA in the last 168 hours. Coagulation Profile: Recent Labs  Lab 03/04/24 2040  INR 1.1   Cardiac Enzymes: No results for input(s): CKTOTAL, CKMB, CKMBINDEX, TROPONINI in the last 168 hours. BNP (last 3 results) Recent Labs    03/04/24 1912  PROBNP 194.0   HbA1C: No results for input(s): HGBA1C in the last 72 hours. CBG: No results for input(s): GLUCAP in the last 168 hours. Lipid Profile: No results for input(s): CHOL, HDL, LDLCALC, TRIG, CHOLHDL, LDLDIRECT in the last 72 hours. Thyroid Function Tests: No results for input(s): TSH, T4TOTAL, FREET4, T3FREE, THYROIDAB in the last 72 hours. Anemia Panel: No results for input(s): VITAMINB12, FOLATE, FERRITIN, TIBC, IRON, RETICCTPCT in the last 72 hours. Urine analysis:    Component Value Date/Time   COLORURINE AMBER (A) 03/04/2024 2039   APPEARANCEUR HAZY (A) 03/04/2024 2039   LABSPEC 1.028 03/04/2024 2039   PHURINE 5.0 03/04/2024 2039   GLUCOSEU  NEGATIVE 03/04/2024 2039   HGBUR NEGATIVE 03/04/2024 2039   BILIRUBINUR NEGATIVE 03/04/2024 2039   KETONESUR NEGATIVE 03/04/2024 2039   PROTEINUR 30 (A) 03/04/2024 2039   NITRITE NEGATIVE 03/04/2024 2039   LEUKOCYTESUR NEGATIVE 03/04/2024 2039   Sepsis Labs: @LABRCNTIP (procalcitonin:4,lacticidven:4) ) Recent Results (from the past 240 hours)  Resp panel by RT-PCR (RSV, Flu A&B, Covid) Anterior Nasal Swab     Status: None   Collection Time: 03/04/24  8:39 PM   Specimen: Anterior Nasal Swab  Result Value Ref Range Status   SARS Coronavirus 2 by RT PCR NEGATIVE NEGATIVE Final    Comment: (NOTE) SARS-CoV-2 target nucleic acids are NOT DETECTED.  The SARS-CoV-2 RNA is generally detectable in upper respiratory specimens during the acute phase of infection. The lowest concentration of SARS-CoV-2 viral copies this assay can detect is 138 copies/mL. A negative result does not preclude SARS-Cov-2 infection and should not be used as the sole basis for treatment or other patient management decisions. A negative result may occur with  improper specimen collection/handling, submission of specimen other than nasopharyngeal swab, presence of viral mutation(s) within the areas targeted by this assay, and inadequate number of viral copies(<138 copies/mL). A negative result must be combined with clinical observations, patient history, and epidemiological information. The expected result is Negative.  Fact Sheet for Patients:  bloggercourse.com  Fact Sheet for Healthcare Providers:  seriousbroker.it  This test is no t yet approved or cleared by the [place]  FDA and  has been authorized for detection and/or diagnosis of SARS-CoV-2 by FDA under an Emergency Use Authorization (EUA). This EUA will remain  in effect (meaning this test can be used) for the duration of the COVID-19 declaration under Section 564(b)(1) of the Act, 21 U.S.C.section  360bbb-3(b)(1), unless the authorization is terminated  or revoked sooner.       Influenza A by PCR NEGATIVE NEGATIVE Final   Influenza B by PCR NEGATIVE NEGATIVE Final    Comment: (NOTE) The Xpert Xpress SARS-CoV-2/FLU/RSV plus assay is intended as an aid in the diagnosis of influenza from Nasopharyngeal swab specimens and should not be used as a sole basis for treatment. Nasal washings and aspirates are unacceptable for Xpert Xpress SARS-CoV-2/FLU/RSV testing.  Fact Sheet for Patients: bloggercourse.com  Fact Sheet for  Healthcare Providers: seriousbroker.it  This test is not yet approved or cleared by the [place]  FDA and has been authorized for detection and/or diagnosis of SARS-CoV-2 by FDA under an Emergency Use Authorization (EUA). This EUA will remain in effect (meaning this test can be used) for the duration of the COVID-19 declaration under Section 564(b)(1) of the Act, 21 U.S.C. section 360bbb-3(b)(1), unless the authorization is terminated or revoked.     Resp Syncytial Virus by PCR NEGATIVE NEGATIVE Final    Comment: (NOTE) Fact Sheet for Patients: bloggercourse.com  Fact Sheet for Healthcare Providers: seriousbroker.it  This test is not yet approved or cleared by the [place]  FDA and has been authorized for detection and/or diagnosis of SARS-CoV-2 by FDA under an Emergency Use Authorization (EUA). This EUA will remain in effect (meaning this test can be used) for the duration of the COVID-19 declaration under Section 564(b)(1) of the Act, 21 U.S.C. section 360bbb-3(b)(1), unless the authorization is terminated or revoked.  Performed at [facility], [address], [place], [place] [address]      Radiological Exams on Admission:   Assessment/Plan Principal Problem:   Neutropenia with fever Active Problems:   Mantle cell lymphoma  (HCC)   HTN (hypertension)   Hyperlipidemia   Coronary artery disease   Chronic diastolic CHF (congestive heart failure) (HCC)   Chronic kidney disease, stage 3a (HCC)   Overweight (BMI 25.0-29.9)   Assessment and Plan:  Neutropenia with fever: WBC 2.6 with 0 neutrophils.  - will admit to tele as inpt - filgrastim -aafi (NIVESTYM ) injection 480 mcg, SQ, daily x 3 dose - hold off Acalabrutinib . - started vancomycin  and cefepime  - blood culture x2 - f/u RVP  - f/u strep A PCR  -IVF: 1.0 L of NS bolus, followed by 75 cc/h - Protective precaution (isolation) ----> Neutropnenia precaution  Mantle cell lymphoma (HCC): pt is on rituximab .  His Acalabrutinib  is on hold due to neutropenia -Follow-up with Dr. Babara  HTN (hypertension) -Amlodipine  - IV hydralazine  as needed  Hyperlipidemia -Pravastatin , Zetia   Coronary artery disease -Aspirin , pravastatin , Zetia   Chronic diastolic CHF (congestive heart failure) (HCC): 2D echo on 03/29/2020 showed EF of 60 to 65% with grade 1 diastolic dysfunction.  Patient does not have leg edema or JVD.  BNP 194.  CHF is compensated. -Will volume status closely  Chronic kidney disease, stage 3a (HCC): Renal function close to baseline.  Recent baseline creatinine 1.22 on 02/26/2024.  His creatinine is 1.38, BUN 17, GFR 54. -Follow-up with BMP  Overweight (BMI 25.0-29.9): Body weight 106.1 kg, BMI 29.25 - Encourage losing weight - Exercise healthy diet      DVT ppx: SQ Lovenox   Code Status: Full code   Family Communication:     not done, no family member is at bed side.      Disposition Plan:  Anticipate discharge back to previous environment  Consults called:  Dr. Babara is aware of this patient  Admission status and Level of care: Telemetry: as inpt        Dispo: The patient is from: Home              Anticipated d/c is to: Home              Anticipated d/c date is: 2 days              Patient currently is not medically stable to  d/c.    Severity of Illness:  The appropriate patient status for  this patient is INPATIENT. Inpatient status is judged to be reasonable and necessary in order to provide the required intensity of service to ensure the patient's safety. The patient's presenting symptoms, physical exam findings, and initial radiographic and laboratory data in the context of their chronic comorbidities is felt to place them at high risk for further clinical deterioration. Furthermore, it is not anticipated that the patient will be medically stable for discharge from the hospital within 2 midnights of admission.   * I certify that at the point of admission it is my clinical judgment that the patient will require inpatient hospital care spanning beyond 2 midnights from the point of admission due to high intensity of service, high risk for further deterioration and high frequency of surveillance required.*       Date of Service 03/05/2024    Caleb Exon [facility]   If 7PM-7AM, please contact night-coverage [email or web address] 03/05/2024, 1:47 AM     [1] No Known Allergies  "

## 2024-03-04 NOTE — Progress Notes (Signed)
 Pharmacy Antibiotic Note  Blake Padilla is a 74 y.o. male admitted on 03/04/2024 with febrile neutropenia .  Pharmacy has been consulted for Cefepime  and Vancomycin  dosing.  Plan: Cefepime  2 mg q8hr per indication & renal fxn.  Pt given Vancomycin  2000 mg once. Vancomycin  2000 mg IV Q 24 hrs. Goal AUC 400-550. Expected AUC: 513.5 SCr used: 1.38  Follow up culture results to assess for antibiotic optimization. Monitor renal function to assess for any necessary antibiotic dosing changes. Pharmacy will continue to follow and will adjust abx dosing whenever warranted.  Temp (24hrs), Avg:98.8 F (37.1 C), Min:98 F (36.7 C), Max:99.6 F (37.6 C)   Recent Labs  Lab 03/04/24 1912 03/04/24 2039  WBC 2.6*  --   CREATININE 1.38*  --   LATICACIDVEN  --  0.7    Estimated Creatinine Clearance: 61.8 mL/min (A) (by C-G formula based on SCr of 1.38 mg/dL (H)).    Allergies[1]  Antimicrobials this admission: 1/09 Cefepime  >>  1/09 Flagyl  >> x 1 dose 1/09 Vancomycin  >>   Microbiology results: 1/09 BCx: Pending  Thank you for allowing pharmacy to be a part of this patients care.  Rankin CANDIE Dills, PharmD, [facility] 03/04/2024 10:51 PM     [1] No Known Allergies

## 2024-03-04 NOTE — Progress Notes (Signed)
 CODE SEPSIS - PHARMACY COMMUNICATION  **Broad Spectrum Antibiotics should be administered within 1 hour of Sepsis diagnosis**  Time Code Sepsis Called/Page Received: 2138  Antibiotics Ordered: Cefepime  & Vancomycin   Time of 1st antibiotic administration: 2323, Pt moved from ED to floor prior to first dose being hung.  Additional doses had to be sent after pt arrived to floor causing delay in hanging of abx.  Additional action taken by pharmacy: N/A  Rankin CANDIE Dills, PharmD, [facility] 03/04/2024 11:36 PM

## 2024-03-04 NOTE — ED Provider Notes (Signed)
 "  [facility] Provider Note    Event Date/Time   First MD Initiated Contact with Patient 03/04/24 1900     (approximate)   History   Fever   HPI  Blake Padilla is a 74 y.o. male with mantle cell lymphoma on immunotherapy who comes in with concerns for a fever.  Patient has been feeling like a cough, headache, sore throat off and on for the past 3 days.  Has had some intermittent fevers.  His wife handed him a card that told him to call the oncology office if he developed a fever or call 911 if it was an emergency.  He thought she wanted him to call 911 so he called 911 and had EMS transport him here.  When he got here the wife stated that she just wanted him to talk to the oncology office about the fever.  He denies any symptoms at this time.  Patient denies any concerns for infections on his feet.     Physical Exam   Triage Vital Signs: ED Triage Vitals  Encounter Vitals Group     BP 03/04/24 1912 124/68     Girls Systolic BP Percentile --      Girls Diastolic BP Percentile --      Boys Systolic BP Percentile --      Boys Diastolic BP Percentile --      Pulse --      Resp 03/04/24 1912 18     Temp 03/04/24 1907 99.6 F (37.6 C)     Temp Source 03/04/24 1907 Oral     SpO2 03/04/24 1912 97 %     Weight 03/04/24 1908 234 lb (106.1 kg)     Height 03/04/24 1908 6' 3 (1.905 m)     Head Circumference --      Peak Flow --      Pain Score 03/04/24 1907 0     Pain Loc --      Pain Education --      Exclude from Growth Chart --     Most recent vital signs: Vitals:   03/04/24 1907 03/04/24 1912  BP:  124/68  Resp:  18  Temp: 99.6 F (37.6 C)   SpO2:  97%     General: Awake, no distress.  CV:  Good peripheral perfusion.  Resp:  Normal effort.  Abd:  No distention.  Soft and nontender Other:  Well-appearing moving his neck, supple    ED Results / Procedures / Treatments   Labs (all labs ordered are listed, but only abnormal results are  displayed) Labs Reviewed  COMPREHENSIVE METABOLIC PANEL WITH GFR - Abnormal; Notable for the following components:      Result Value   CO2 21 (*)    Glucose, Bld 135 (*)    Creatinine, Ser 1.38 (*)    Calcium  8.7 (*)    GFR, Estimated 54 (*)    All other components within normal limits  CBC WITH DIFFERENTIAL/PLATELET - Abnormal; Notable for the following components:   WBC 2.6 (*)    Hemoglobin 11.7 (*)    HCT 35.9 (*)    Neutro Abs 0.0 (*)    Monocytes Absolute 1.5 (*)    All other components within normal limits  CULTURE, BLOOD (ROUTINE X 2)  CULTURE, BLOOD (ROUTINE X 2)  RESP PANEL BY RT-PCR (RSV, FLU A&B, COVID)  [id number]  LACTIC ACID, PLASMA  LACTIC ACID, PLASMA  URINALYSIS, W/ REFLEX TO CULTURE (INFECTION SUSPECTED)  PROTIME-INR     EKG  My interpretation of EKG:  Normal sinus rate 76 ST elevation T wave versions, normal intervals  RADIOLOGY I have reviewed the xray personally and interpreted no evidence of any pneumonia   PROCEDURES:  Critical Care performed: Yes, see critical care procedure note(s)  .1-3 Lead EKG Interpretation  Performed by: Ernest Ronal BRAVO, MD Authorized by: Ernest Ronal BRAVO, MD     Interpretation: normal     ECG rate assessment: normal     Rhythm: sinus rhythm     Ectopy: none     Conduction: normal   .Critical Care  Performed by: Ernest Ronal BRAVO, MD Authorized by: Ernest Ronal BRAVO, MD   Critical care provider statement:    Critical care time (minutes):  30   Critical care was necessary to treat or prevent imminent or life-threatening deterioration of the following conditions:  Sepsis   Critical care was time spent personally by me on the following activities:  Development of treatment plan with patient or surrogate, discussions with consultants, evaluation of patient's response to treatment, examination of patient, ordering and review of laboratory studies, ordering and review of radiographic studies, ordering and performing treatments and  interventions, pulse oximetry, re-evaluation of patient's condition and review of old charts    MEDICATIONS ORDERED IN ED: Medications  filgrastim -aafi (NIVESTYM ) injection 480 mcg (has no administration in time range)     IMPRESSION / MDM / ASSESSMENT AND PLAN / ED COURSE  I reviewed the triage vital signs and the nursing notes.   Patient's presentation is most consistent with acute presentation with potential threat to life or bodily function.   Patient comes in with concerns with fever.  Septic workup was ordered.  Given that seems more consistent with flu and he reports no continued symptoms I did not order antibiotics to start with.  He did not meet sepsis criteria.  However with EMS he did reportedly have a fever.  Patient CBC now shows a neutropenia.  Discussed the case with Dr. Zelphia who ordered medications to help with getting the white count back up recommended broad-spectrum antibiotics.  Did discuss with her even if patient is flu positive would still recommend admission and would recommend Tamiflu.  This is still pending.  His other workup is reassuring with this reassuring CMP.  Reassuring lactic.  Will place on neutropenic precautions and discussed with hospital team for admission  The patient is on the cardiac monitor to evaluate for evidence of arrhythmia and/or significant heart rate changes.      FINAL CLINICAL IMPRESSION(S) / ED DIAGNOSES   Final diagnoses:  Neutropenic fever     Rx / DC Orders   ED Discharge Orders     None        Note:  This document was prepared using Dragon voice recognition software and may include unintentional dictation errors.   Ernest Ronal BRAVO, MD 03/04/24 2122  "

## 2024-03-04 NOTE — Sepsis Progress Note (Addendum)
 Elink monitoring for the code sepsis protocol.  Blood cultures collected an hour prior to code sepsis order.

## 2024-03-04 NOTE — ED Triage Notes (Signed)
 Patient called EMS for fever of 102 of 2 days with headache. HX of lymphoma and CAD. Rituxan  02/26/23. EMS gave 1000 mg of acetaminophen . Vitals from EMS 139/69, HR 96, O2 of 99, RR 16, and temp of 100.5.

## 2024-03-05 ENCOUNTER — Encounter: Payer: Self-pay | Admitting: Internal Medicine

## 2024-03-05 DIAGNOSIS — R5081 Fever presenting with conditions classified elsewhere: Secondary | ICD-10-CM | POA: Diagnosis not present

## 2024-03-05 DIAGNOSIS — D709 Neutropenia, unspecified: Secondary | ICD-10-CM | POA: Diagnosis not present

## 2024-03-05 LAB — BASIC METABOLIC PANEL WITH GFR
Anion gap: 9 (ref 5–15)
BUN: 14 mg/dL (ref 8–23)
CO2: 22 mmol/L (ref 22–32)
Calcium: 8.6 mg/dL — ABNORMAL LOW (ref 8.9–10.3)
Chloride: 106 mmol/L (ref 98–111)
Creatinine, Ser: 1.15 mg/dL (ref 0.61–1.24)
GFR, Estimated: >60 mL/min
Glucose, Bld: 134 mg/dL — ABNORMAL HIGH (ref 70–99)
Potassium: 3.7 mmol/L (ref 3.5–5.1)
Sodium: 137 mmol/L (ref 135–145)

## 2024-03-05 LAB — CBC
HCT: 34.8 % — ABNORMAL LOW (ref 39.0–52.0)
Hemoglobin: 11.2 g/dL — ABNORMAL LOW (ref 13.0–17.0)
MCH: 27.4 pg (ref 26.0–34.0)
MCHC: 32.2 g/dL (ref 30.0–36.0)
MCV: 85.1 fL (ref 80.0–100.0)
Platelets: 184 K/uL (ref 150–400)
RBC: 4.09 MIL/uL — ABNORMAL LOW (ref 4.22–5.81)
RDW: 13.1 % (ref 11.5–15.5)
WBC: 3.5 K/uL — ABNORMAL LOW (ref 4.0–10.5)
nRBC: 0 % (ref 0.0–0.2)

## 2024-03-05 LAB — RESPIRATORY PANEL BY PCR

## 2024-03-05 LAB — GROUP A STREP BY PCR: Group A Strep by PCR: NOT DETECTED

## 2024-03-05 MED ORDER — VANCOMYCIN HCL 2000 MG/400ML IV SOLN
2000.0000 mg | INTRAVENOUS | Status: DC
  Administered 2024-03-05: 2000 mg via INTRAVENOUS
  Filled 2024-03-05 (×2): qty 400

## 2024-03-05 MED ORDER — ACYCLOVIR 200 MG PO CAPS
400.0000 mg | ORAL_CAPSULE | Freq: Every day | ORAL | Status: DC
  Administered 2024-03-05 – 2024-03-08 (×4): 400 mg via ORAL
  Filled 2024-03-05 (×4): qty 2

## 2024-03-05 MED ORDER — MENTHOL 3 MG MT LOZG
1.0000 | LOZENGE | OROMUCOSAL | Status: DC | PRN
  Filled 2024-03-05: qty 9

## 2024-03-05 NOTE — Progress Notes (Signed)
 " Progress Note   Patient: Blake Padilla FMW:[id number] DOB: Feb 09, 1951 DOA: 03/04/2024     1 DOS: the patient was seen and examined on 03/05/2024   Brief hospital course: Per H&P HPI   HPI: Blake Padilla is a 74 y.o. 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   Patient has chills and fever in the past 3 days with temperature up to 102 at home.  Patient has some mild intermittent sore throat, no cough, SOB, chest pain.  No sick contact.  No nausea, vomiting, diarrhea or abdominal pain.  No symptoms UTI.     Patient is sent to ED by her oncologist, Dr. Babara for neutropenia fever. Dr. Babara recommended broad spectrum antibiotics and short acting GCSF daily x 3, and hold off Acalabrutinib .   Data reviewed independently and ED Course: pt was found to have WBC 2.6 with neutrophil 0, slightly worsening renal function, negative UA, negative PCR for COVID, flu and RSV, lactic acid 0.7.  Temperature 99.6 in ED, blood pressure 124/68, HR 77, RR 18, oxygen saturation 97% on room air.  Chest x-ray clear.  Patient is admitted to telemetry bed as inpatient.    Assessment and Plan:  Neutropenic fever No obvious symptoms of infection.  Had some nasal congestion and pharyngitis, respiratory pathogen panel negative, group A strep PCR negative.  UA without evidence of infection.  Chest x-ray without infiltrate.  Had a fever this a.m. to 100.6 F. --Follow-up blood cultures --Continue cefepime  can likely DC vancomycin  in the a.m. --Follow-up neutrophil counts in the a.m. --Continue G-CSF agent per patient's outpatient oncologist -- Hold echo acalabrutinib   Mantle cell lymphoma Patient follows with Dr. Babara, he is currently on rituximab , acalabrutinib  and is on hold due to neutropenia  Hypertension Continue home  amlodipine   Hyperlipidemia Continue lipid-lowering medications   Chronic diastolic heart failure Appears compensated   Likely not CKD3a GFR>60 this AM     Latest Ref Rng & Units 03/05/2024    6:13 AM 03/04/2024    7:12 PM 02/26/2024    8:56 AM  BMP  Glucose 70 - 99 mg/dL 865  864  871   BUN 8 - 23 mg/dL 14  17  17    Creatinine 0.61 - 1.24 mg/dL 8.84  8.61  8.77   Sodium 135 - 145 mmol/L 137  138  141   Potassium 3.5 - 5.1 mmol/L 3.7  3.9  4.5   Chloride 98 - 111 mmol/L 106  104  106   CO2 22 - 32 mmol/L 22  21  24    Calcium  8.9 - 10.3 mg/dL 8.6  8.7  9.2    Avoid nephrotoxic agents Continue to monitor  CAD, three-vessel coronary artery calcifications on CT chest Follows outpatient with [facility] cardiology   History of abdominal aortic aneurysm repair  Overweight BMI 29 Complicates care      Subjective: Patient states that he has stopped taking his acalabrutinib  1-1/2 weeks ago.  This was to help his blood counts recover.  Patient states that he has had a sore throat and nasal congestion intermittently but no cough or shortness of breath.  He has also had a right-sided headache that has been amenable to Tylenol .  He denies diarrhea, nausea vomiting.  Physical Exam: Vitals:   03/05/24 0158 03/05/24 0525 03/05/24 0806 03/05/24 1137  BP: (!) 132/59  135/65 (!) 116/56 (!) 121/59  Pulse: 95 89 86 88  Resp:   18 16  Temp: (!) 100.6 F (38.1 C) 99.1 F (37.3 C) 99.4 F (37.4 C) 99.7 F (37.6 C)  TempSrc: Oral Oral    SpO2: 99% 98% 96% 96%  Weight:      Height:       Physical Exam  Constitutional: In no distress.  Cardiovascular: Normal rate, regular rhythm. No lower extremity edema  Pulmonary: Non labored breathing on room air, no wheezing or rales.   Abdominal: Soft. Non distended and non tender Musculoskeletal: Normal range of motion.     Neurological: Alert and oriented to person, place, and time. Non focal  Skin: Skin is warm and dry.   Data Reviewed:      Latest Ref Rng & Units 03/05/2024    6:13 AM 03/04/2024    7:12 PM 02/26/2024    8:56 AM  BMP  Glucose 70 - 99 mg/dL 865  864  871   BUN 8 - 23 mg/dL 14  17  17    Creatinine 0.61 - 1.24 mg/dL 8.84  8.61  8.77   Sodium 135 - 145 mmol/L 137  138  141   Potassium 3.5 - 5.1 mmol/L 3.7  3.9  4.5   Chloride 98 - 111 mmol/L 106  104  106   CO2 22 - 32 mmol/L 22  21  24    Calcium  8.9 - 10.3 mg/dL 8.6  8.7  9.2       Latest Ref Rng & Units 03/05/2024    6:13 AM 03/04/2024    7:12 PM 02/26/2024    8:56 AM  CBC  WBC 4.0 - 10.5 K/uL 3.5  2.6  3.3   Hemoglobin 13.0 - 17.0 g/dL 88.7  88.2  87.5   Hematocrit 39.0 - 52.0 % 34.8  35.9  39.0   Platelets 150 - 400 K/uL 184  192  181      Family Communication: None at bedside  Disposition: Status is: Inpatient Remains inpatient appropriate because: IV antibiotics for febrile neutropenia  Planned Discharge Destination: Home    Time spent: 35 minutes  Author: Alban Pepper, MD 03/05/2024 3:18 PM  For on call review [email or web address]  "

## 2024-03-05 NOTE — Hospital Course (Signed)
 1.5wkagoyou stopped taking100mg  medicine totry andhelpbuildhisblood counts up.   Awed sore throat, no sick contacts.Noothersymtpoms.  Healsohad a ha.   Intermittent ha as well.naggingspotonR eye.   Nodiarrhea,no n/v.nasalcongestion.

## 2024-03-06 DIAGNOSIS — R5081 Fever presenting with conditions classified elsewhere: Secondary | ICD-10-CM | POA: Diagnosis not present

## 2024-03-06 DIAGNOSIS — C8310 Mantle cell lymphoma, unspecified site: Secondary | ICD-10-CM | POA: Diagnosis not present

## 2024-03-06 DIAGNOSIS — D709 Neutropenia, unspecified: Secondary | ICD-10-CM | POA: Diagnosis not present

## 2024-03-06 LAB — CBC WITH DIFFERENTIAL/PLATELET
Basophils Absolute: 0 K/uL (ref 0.0–0.1)
Basophils Relative: 0 %
Eosinophils Absolute: 0.4 K/uL (ref 0.0–0.5)
Eosinophils Relative: 7 %
HCT: 36.7 % — ABNORMAL LOW (ref 39.0–52.0)
Hemoglobin: 11.5 g/dL — ABNORMAL LOW (ref 13.0–17.0)
Lymphocytes Relative: 36 %
Lymphs Abs: 2 K/uL (ref 0.7–4.0)
MCH: 26.8 pg (ref 26.0–34.0)
MCHC: 31.3 g/dL (ref 30.0–36.0)
MCV: 85.5 fL (ref 80.0–100.0)
Monocytes Absolute: 1.6 K/uL — ABNORMAL HIGH (ref 0.1–1.0)
Monocytes Relative: 29 %
Neutro Abs: 0.4 K/uL — CL (ref 1.7–7.7)
Neutrophils Relative %: 8 %
Other: 20 %
Platelets: 190 K/uL (ref 150–400)
RBC: 4.29 MIL/uL (ref 4.22–5.81)
RDW: 13 % (ref 11.5–15.5)
WBC: 5.6 K/uL (ref 4.0–10.5)
nRBC: 0 % (ref 0.0–0.2)

## 2024-03-06 LAB — BASIC METABOLIC PANEL WITH GFR
Anion gap: 8 (ref 5–15)
BUN: 10 mg/dL (ref 8–23)
CO2: 24 mmol/L (ref 22–32)
Calcium: 9.1 mg/dL (ref 8.9–10.3)
Chloride: 105 mmol/L (ref 98–111)
Creatinine, Ser: 1.1 mg/dL (ref 0.61–1.24)
GFR, Estimated: >60 mL/min
Glucose, Bld: 117 mg/dL — ABNORMAL HIGH (ref 70–99)
Potassium: 3.7 mmol/L (ref 3.5–5.1)
Sodium: 138 mmol/L (ref 135–145)

## 2024-03-06 MED ORDER — SALINE SPRAY 0.65 % NA SOLN: 1.0000 | NASAL | Status: DC | PRN

## 2024-03-06 MED ORDER — CHLORHEXIDINE GLUCONATE 0.12 % MT SOLN
15.0000 mL | Freq: Two times a day (BID) | OROMUCOSAL | Status: DC
  Administered 2024-03-06 – 2024-03-08 (×5): 15 mL via OROMUCOSAL
  Filled 2024-03-06 (×5): qty 15

## 2024-03-06 NOTE — Plan of Care (Signed)
   Problem: Health Behavior/Discharge Planning: Goal: Ability to manage health-related needs will improve Outcome: Progressing   Problem: Clinical Measurements: Goal: Ability to maintain clinical measurements within normal limits will improve Outcome: Progressing   Problem: Activity: Goal: Risk for activity intolerance will decrease Outcome: Progressing   Problem: Nutrition: Goal: Adequate nutrition will be maintained Outcome: Progressing

## 2024-03-06 NOTE — Progress Notes (Signed)
 " Progress Note   Patient: Blake Padilla FMW:[id number] DOB: [birth year] DOA: 03/04/2024     2 DOS: the patient was seen and examined on 03/06/2024   Brief hospital course: Per H&P HPI   HPI: Blake Padilla is a 73 y.o. 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   Patient has chills and fever in the past 3 days with temperature up to 102 at home.  Patient has some mild intermittent sore throat, no cough, SOB, chest pain.  No sick contact.  No nausea, vomiting, diarrhea or abdominal pain.  No symptoms UTI.     Patient is sent to ED by her oncologist, Dr. Babara for neutropenia fever. Dr. Babara recommended broad spectrum antibiotics and short acting GCSF daily x 3, and hold off Acalabrutinib .   Data reviewed independently and ED Course: pt was found to have WBC 2.6 with neutrophil 0, slightly worsening renal function, negative UA, negative PCR for COVID, flu and RSV, lactic acid 0.7.  Temperature 99.6 in ED, blood pressure 124/68, HR 77, RR 18, oxygen saturation 97% on room air.  Chest x-ray clear.  Patient is admitted to telemetry bed as inpatient.    Assessment and Plan:  Neutropenic fever No obvious symptoms of infection.  Had some nasal congestion and pharyngitis, respiratory pathogen panel negative, group A strep PCR negative.  UA without evidence of infection.  Chest x-ray without infiltrate. Bcx ngtd. Had fever 1/10 to 101.5 F --Continue cefepime , vancomycin  and acyclovir  per oncology until ANC>=1. -- Continue to monitor neutrophil count --Continue G-CSF agent per oncology -- Hold acalabrutinib   Mantle cell lymphoma Patient follows with Dr. Babara, he is currently on rituximab , acalabrutinib  and is on hold due to neutropenia  Hypertension Mostly controlled, continue home amlodipine   Hyperlipidemia Continue lipid-lowering  medications   Chronic diastolic heart failure Appears compensated   Likely not CKD3a GFR>60 this AM     Latest Ref Rng & Units 03/06/2024    8:19 AM 03/05/2024    6:13 AM 03/04/2024    7:12 PM  BMP  Glucose 70 - 99 mg/dL 882  865  864   BUN 8 - 23 mg/dL 10  14  17    Creatinine 0.61 - 1.24 mg/dL 8.89  8.84  8.61   Sodium 135 - 145 mmol/L 138  137  138   Potassium 3.5 - 5.1 mmol/L 3.7  3.7  3.9   Chloride 98 - 111 mmol/L 105  106  104   CO2 22 - 32 mmol/L 24  22  21    Calcium  8.9 - 10.3 mg/dL 9.1  8.6  8.7    Avoid nephrotoxic agents Continue to monitor  CAD, three-vessel coronary artery calcifications on CT chest Follows outpatient with [facility] cardiology   History of abdominal aortic aneurysm repair  Overweight BMI 29 Complicates care      Subjective: Patient feels headache and sore throat are improved.  Physical Exam: Vitals:   03/05/24 2017 03/06/24 0429 03/06/24 0500 03/06/24 0828  BP: 134/62 118/66  120/68  Pulse: 79 89  83  Resp: 18 18  18   Temp: 99 F (37.2 C) 98.9 F (37.2 C)  98.5 F (36.9 C)  TempSrc: Oral Oral  Oral  SpO2: 98% 98%  99%  Weight:   104.4 kg   Height:        Constitutional: In  no distress.  Cardiovascular: Normal rate, regular rhythm. No lower extremity edema  Pulmonary: Non labored breathing on room air, no wheezing or rales.   Abdominal: Soft. Non distended and non tender Musculoskeletal: Normal range of motion.     Neurological: Alert and oriented to person, place, and time. Non focal  Skin: Skin is warm and dry.   Data Reviewed:     Latest Ref Rng & Units 03/06/2024    8:19 AM 03/05/2024    6:13 AM 03/04/2024    7:12 PM  BMP  Glucose 70 - 99 mg/dL 882  865  864   BUN 8 - 23 mg/dL 10  14  17    Creatinine 0.61 - 1.24 mg/dL 8.89  8.84  8.61   Sodium 135 - 145 mmol/L 138  137  138   Potassium 3.5 - 5.1 mmol/L 3.7  3.7  3.9   Chloride 98 - 111 mmol/L 105  106  104   CO2 22 - 32 mmol/L 24  22  21    Calcium  8.9 - 10.3 mg/dL  9.1  8.6  8.7       Latest Ref Rng & Units 03/05/2024    6:13 AM 03/04/2024    7:12 PM 02/26/2024    8:56 AM  CBC  WBC 4.0 - 10.5 K/uL 3.5  2.6  3.3   Hemoglobin 13.0 - 17.0 g/dL 88.7  88.2  87.5   Hematocrit 39.0 - 52.0 % 34.8  35.9  39.0   Platelets 150 - 400 K/uL 184  192  181      Family Communication: None at bedside  Disposition: Status is: Inpatient Remains inpatient appropriate because: IV antibiotics for febrile neutropenia  Planned Discharge Destination: Home    Time spent: 35 minutes  Author: Alban Pepper, MD 03/06/2024 9:19 AM  For on call review [email or web address]  "

## 2024-03-06 NOTE — Progress Notes (Signed)
 Mobility Specialist - Progress Note     03/06/24 1139  Mobility  Activity Ambulated independently  Level of Assistance Independent  Assistive Device None  Distance Ambulated (ft) 1000 ft  Range of Motion/Exercises Active  Activity Response Tolerated well  Mobility Referral Yes  Mobility visit 1 Mobility   Pt ambulates to hallway NS upon entry on RA. Pt ambulates to hallway around NS and out to medical mall area indep with no AD. Pt returned to bed and left with needs in reach. MD present at bedside.   Guido Rumble Mobility Specialist 03/06/2024, 11:49 AM

## 2024-03-06 NOTE — Consult Note (Signed)
 "  Hematology/Oncology Consult note [phone] [phone]      Patient Care Team: Buren Rock HERO, MD as PCP - General (Family Medicine) Darliss Rogue, MD as PCP - Cardiology (Cardiology) Babara Call, MD as Consulting Physician (Oncology)   Name of the patient: Blake Padilla  [id number]  1950/10/18   REASON FOR COSULTATION:  Neutropenic fever History of presenting illness-  74 y.o. male with PMH listed at below who presents to ER due to febrile with a temperature of 102 at home.  Symptoms started about 3 days ago. Patient has experienced intermittent sore throat, denies cough, [place], dental pain, shortness of breath, dysuria chest pain nausea vomiting diarrhea. He is known to me for mantle cell lymphoma.  He is currently on rituximab  monthly maintenance and acalabrutinib  100 mg twice daily.  2 weeks ago, patient was seen by my colleague and was found to have low ANC of 0.5.  He was advised to stop acalabrutinib .  He has been off treatments for about 2 weeks.  Last rituximab  was about 2 weeks ago.  In the emergency room, he was found to have leukopenia with WBC of 2.6, ANC 0. Urinalysis was negative.  Respiratory virus panel was negative for COVID, flu and RSV. Chest x-ray showed no acute disease.  I discussed with the ER and admitting physician and recommend patient to get short acting G-CSF daily for 3 doses. Patient currently admitted for neutropenic fever and covered with empiric broad-spectrum antibiotics Today patient reports slightly better. 03/05/2024 he had a temperature of 100.6.  Afebrile today.  Allergies[1]  Patient Active Problem List   Diagnosis Date Noted   Mantle cell lymphoma (HCC) 10/06/2023    Priority: High   B12 deficiency 01/29/2024    Priority: Medium    Elevated serum creatinine 01/29/2024    Priority: Medium    Anxiety 11/12/2023    Priority: Medium    Normocytic anemia 10/06/2023    Priority: Medium    Neutropenia with  fever 03/04/2024   HTN (hypertension) 03/04/2024   Chronic diastolic CHF (congestive heart failure) (HCC) 03/04/2024   Chronic kidney disease, stage 3a (HCC) 03/04/2024   Overweight (BMI 25.0-29.9) 03/04/2024   Status post robot-assisted surgical procedure 09/14/2023   Mediastinal mass 09/14/2023   Cough, unspecified 06/15/2023   Abscess 01/31/2022   Epidermoid cyst of skin 01/20/2022   Prediabetes 12/05/2021   Routine history and physical examination of adult 12/05/2021   Status post total hip replacement, left 06/11/2021   Pain in joint involving lower leg 04/23/2021   Generalized anxiety disorder 03/26/2021   Sciatica of left side 03/26/2021   Presence of other vascular implants and grafts 01/25/2021   Iliac aneurysm 01/16/2021   Hyperlipidemia 12/18/2020   Aneurysm of iliac artery 12/13/2020   GERD (gastroesophageal reflux disease) 11/27/2020   Coronary artery disease 03/15/2020   Dysphagia 05/31/2019   Screening for colon cancer 05/31/2019   Screening for lipoid disorders 05/31/2019   Tendonitis 10/22/2018   Reactive airway disease 01/19/2018   Tobacco use 06/09/2017   Plantar wart 06/09/2017   Pain in joint involving shoulder region 04/17/2010   Neck mass 09/27/2007     Past Medical History:  Diagnosis Date   Anxiety    in small places   Arthritis    COPD (chronic obstructive pulmonary disease) (HCC)    Pt denies   Coronary artery disease    GERD (gastroesophageal reflux disease)    Hypertension    Iliac aneurysm 01/16/2021   Panic attacks  Peripheral vascular disease    Iliac Artery Aneurysm Repair     Past Surgical History:  Procedure Laterality Date   CATARACT EXTRACTION W/ INTRAOCULAR LENS IMPLANT Right    ENDOVASCULAR STENT GRAFT (AAA) N/A 01/16/2021   Procedure: ENDOVASCULAR REPAIR/STENT GRAFT;  Surgeon: Marea Selinda RAMAN, MD;  Location: [facility];  Service: Cardiovascular;  Laterality: N/A;   EXCISION, MASS, MEDIASTINUM, ROBOT-ASSISTED  Right 09/14/2023   Procedure: EXCISION, MASS, MEDIASTINUM, ROBOT-ASSISTED;  Surgeon: Kerrin Elspeth BROCKS, MD;  Location: [facility] OR;  Service: Thoracic;  Laterality: Right;  RIGHT ROBOTIC RESECTION OF PERICARDIAL FAT PAD AND MEDIASTINAL LYMPH NODES   ILIAC ARTERY ANEURYSM REPAIR  01/16/2021   INTERCOSTAL NERVE BLOCK N/A 09/14/2023   Procedure: BLOCK, NERVE, INTERCOSTAL;  Surgeon: Kerrin Elspeth BROCKS, MD;  Location: [facility] OR;  Service: Thoracic;  Laterality: N/A;   TOTAL HIP ARTHROPLASTY Left 06/11/2021   Procedure: TOTAL HIP ARTHROPLASTY ANTERIOR APPROACH;  Surgeon: Kathlynn Sharper, MD;  Location: [facility] ORS;  Service: Orthopedics;  Laterality: Left;    Social History   Socioeconomic History   Marital status: Married    Spouse name: Susie   Number of children: 1   Years of education: Not on file   Highest education level: Not on file  Occupational History   Not on file  Tobacco Use   Smoking status: Former    Current packs/day: 0.00    Average packs/day: 1 pack/day for 52.8 years (52.8 ttl pk-yrs)    Types: Cigarettes    Start date: 21    Quit date: 12/13/2021    Years since quitting: 2.2   Smokeless tobacco: Never   Tobacco comments:    Quit smoking 11/2021  Vaping Use   Vaping status: Never Used  Substance and Sexual Activity   Alcohol use: Not Currently    Comment: with colds   Drug use: Never   Sexual activity: Yes  Other Topics Concern   Not on file  Social History Narrative   Not on file   Social Drivers of Health   Tobacco Use: Medium Risk (03/05/2024)   Patient History    Smoking Tobacco Use: Former    Smokeless Tobacco Use: Never    Passive Exposure: Not on Actuary Strain: Low Risk (10/06/2023)   Overall Financial Resource Strain (CARDIA)    Difficulty of Paying Living Expenses: Not very hard  Food Insecurity: No Food Insecurity (03/04/2024)   Epic    Worried About Radiation Protection Practitioner of Food in the Last Year: Never true    Ran Out of Food in the Last  Year: Never true  Transportation Needs: No Transportation Needs (03/04/2024)   Epic    Lack of Transportation (Medical): No    Lack of Transportation (Non-Medical): No  Physical Activity: Not on file  Stress: No Stress Concern Present (10/06/2023)   [employer] of Occupational Health - Occupational Stress Questionnaire    Feeling of Stress: Not at all  Social Connections: Moderately Integrated (03/04/2024)   Social Connection and Isolation Panel    Frequency of Communication with Friends and Family: More than three times a week    Frequency of Social Gatherings with Friends and Family: Once a week    Attends Religious Services: 1 to 4 times per year    Active Member of [employer] or Organizations: No    Attends Banker Meetings: Never    Marital Status: Married  Catering Manager Violence: Not At Risk (03/04/2024)   Epic  Fear of Current or Ex-Partner: No    Emotionally Abused: No    Physically Abused: No    Sexually Abused: No  Depression (PHQ2-9): Low Risk (02/26/2024)   Depression (PHQ2-9)    PHQ-2 Score: 0  Alcohol Screen: Not on file  Housing: Low Risk (03/04/2024)   Epic    Unable to Pay for Housing in the Last Year: No    Number of Times Moved in the Last Year: 0    Homeless in the Last Year: No  Utilities: Not At Risk (03/04/2024)   Epic    Threatened with loss of utilities: No  Health Literacy: Adequate Health Literacy (10/06/2023)   B1300 Health Literacy    Frequency of need for help with medical instructions: Rarely     Family History  Problem Relation Age of Onset   Diabetes Mother    Hypertension Mother    Diabetes Father    Hypertension Father    Leukemia Sister    Diabetes Brother    Dementia Sister    COPD Sister    Healthy Sister     Current Medications[2]  Review of Systems  Constitutional:  Positive for fatigue and fever. Negative for appetite change and chills.  HENT:   Negative for hearing loss and voice change.   Eyes:  Negative for eye  problems.  Respiratory:  Negative for chest tightness, cough and shortness of breath.   Cardiovascular:  Negative for chest pain.  Gastrointestinal:  Negative for abdominal distention, abdominal pain and blood in stool.  Endocrine: Negative for hot flashes.  Genitourinary:  Negative for difficulty urinating and frequency.   Musculoskeletal:  Negative for arthralgias.  Skin:  Negative for itching and rash.  Neurological:  Negative for extremity weakness.  Hematological:  Negative for adenopathy.  Psychiatric/Behavioral:  Negative for confusion.     PHYSICAL EXAM Vitals:   03/05/24 2017 03/06/24 0429 03/06/24 0500 03/06/24 0828  BP: 134/62 118/66  120/68  Pulse: 79 89  83  Resp: 18 18  18   Temp: 99 F (37.2 C) 98.9 F (37.2 C)  98.5 F (36.9 C)  TempSrc: Oral Oral  Oral  SpO2: 98% 98%  99%  Weight:   230 lb 2.6 oz (104.4 kg)   Height:       Physical Exam Constitutional:      General: He is not in acute distress.    Appearance: He is not diaphoretic.  HENT:     Head: Normocephalic and atraumatic.  Eyes:     General: No scleral icterus. Cardiovascular:     Rate and Rhythm: Normal rate and regular rhythm.  Pulmonary:     Effort: Pulmonary effort is normal. No respiratory distress.     Breath sounds: Normal breath sounds. No wheezing.  Abdominal:     General: There is no distension.     Palpations: Abdomen is soft.     Tenderness: There is no abdominal tenderness.  Musculoskeletal:        General: Normal range of motion.     Cervical back: Normal range of motion and neck supple.  Skin:    General: Skin is warm and dry.     Findings: No erythema.  Neurological:     Mental Status: He is alert and oriented to person, place, and time. Mental status is at baseline.     Motor: No abnormal muscle tone.  Psychiatric:        Mood and Affect: Affect normal.  LABORATORY STUDIES    Latest Ref Rng & Units 03/06/2024    8:19 AM 03/05/2024    6:13 AM 03/04/2024    7:12 PM   CBC  WBC 4.0 - 10.5 K/uL 5.6  3.5  2.6   Hemoglobin 13.0 - 17.0 g/dL 88.4  88.7  88.2   Hematocrit 39.0 - 52.0 % 36.7  34.8  35.9   Platelets 150 - 400 K/uL 190  184  192       Latest Ref Rng & Units 03/06/2024    8:19 AM 03/05/2024    6:13 AM 03/04/2024    7:12 PM  CMP  Glucose 70 - 99 mg/dL 882  865  864   BUN 8 - 23 mg/dL 10  14  17    Creatinine 0.61 - 1.24 mg/dL 8.89  8.84  8.61   Sodium 135 - 145 mmol/L 138  137  138   Potassium 3.5 - 5.1 mmol/L 3.7  3.7  3.9   Chloride 98 - 111 mmol/L 105  106  104   CO2 22 - 32 mmol/L 24  22  21    Calcium  8.9 - 10.3 mg/dL 9.1  8.6  8.7   Total Protein 6.5 - 8.1 g/dL   7.3   Total Bilirubin 0.0 - 1.2 mg/dL   1.2   Alkaline Phos 38 - 126 U/L   99   AST 15 - 41 U/L   26   ALT 0 - 44 U/L   9      RADIOGRAPHIC STUDIES: I have personally reviewed the radiological images as listed and agreed with the findings in the report. DG Chest Port 1 View Result Date: 03/04/2024 EXAM: 1 VIEW(S) XRAY OF THE CHEST 03/04/2024 07:39:07 PM COMPARISON: 09/22/2023 CLINICAL HISTORY: Questionable sepsis - evaluate for abnormality FINDINGS: LUNGS AND PLEURA: No focal pulmonary opacity. No pleural effusion. No pneumothorax. HEART AND MEDIASTINUM: Atherosclerotic plaque. No acute abnormality of the cardiac and mediastinal silhouettes. BONES AND SOFT TISSUES: No acute osseous abnormality. IMPRESSION: 1. No acute cardiopulmonary abnormality. 2. Atherosclerotic plaque. Electronically signed by: Dorethia Molt MD MD 03/04/2024 07:48 PM EST RP Workstation: [id number]   US  RENAL Result Date: 02/18/2024 EXAM: US  Retroperitoneum Complete, Renal. 02/17/2024 09:04:08 AM TECHNIQUE: Real-time ultrasonography of the retroperitoneum renal was performed. COMPARISON: 01/01/2021 CLINICAL HISTORY: Elevated creatinine. FINDINGS: LIVER: Increased echotexture of the liver. RIGHT KIDNEY/URETER: Right kidney measures 11.7 x 4.2 x 6.4 cm. Normal cortical echogenicity. No hydronephrosis. No  calculus. No mass. Small interpolar region cyst measuring 1.3 x 0.9 cm. LEFT KIDNEY/URETER: Left kidney measures 13.3 x 6.6 x 5.5 cm. Normal cortical echogenicity. No hydronephrosis. No calculus. No mass. Left renal cyst on the prior CT was not well visualized on today's ultrasound. BLADDER: The urinary bladder is completely decompressed. IMPRESSION: 1. No hydronephrosis or nephrolithiasis. 2. Hepatic steatosis. Electronically signed by: Rogelia Myers MD 02/18/2024 10:32 PM EST RP Workstation: [id number]     Assessment and plan-   # Neutropenic fever, blood cultures so far negative.  UA negative. ANC has improved.  Continue finish course of 3 daily doses of GSF. Tylenol  as needed if he experiences bone pain as side effects of G-CSF Add Peridex  oral rinse Neutropenia precaution.  Monitor CBC with differential daily Continue empiric antibiotics until ANC >=1  # Mantle cell lymphoma on rituximab  maintenance.  Neutropenia is likely secondary to marrow suppression from acalabrutinib  which has been hold for 2 weeks already.  I recommend patient not to resume at discharge Follow-up outpatient.  Thank you for allowing me to participate in the care of this patient.   Zelphia Cap, MD, PhD Hematology Oncology 03/06/2024     [1] No Known Allergies [2]  Current Facility-Administered Medications:    acetaminophen  (TYLENOL ) tablet 650 mg, 650 mg, Oral, Q6H PRN, Niu, Xilin, MD, 650 mg at 03/05/24 1703   acyclovir  (ZOVIRAX ) 200 MG capsule 400 mg, 400 mg, Oral, Daily, Belue, Nathan S, RPH, 400 mg at 03/06/24 0830   albuterol  (PROVENTIL ) (2.5 MG/3ML) 0.083% nebulizer solution 2.5 mg, 2.5 mg, Nebulization, Q4H PRN, Dail Rankin RAMAN, RPH   amLODipine  (NORVASC ) tablet 5 mg, 5 mg, Oral, Daily, Niu, Xilin, MD, 5 mg at 03/06/24 0831   aspirin  EC tablet 81 mg, 81 mg, Oral, Daily, Niu, Xilin, MD, 81 mg at 03/06/24 0831   ceFEPIme  (MAXIPIME ) 2 g in sodium chloride  0.9 % 100 mL IVPB, 2 g, Intravenous, Q8H, Belue,  Rankin RAMAN, RPH, Last Rate: 200 mL/hr at 03/06/24 0832, 2 g at 03/06/24 9167   dextromethorphan-guaiFENesin  (MUCINEX  DM) 30-600 MG per 12 hr tablet 1 tablet, 1 tablet, Oral, BID PRN, Niu, Xilin, MD   enoxaparin  (LOVENOX ) injection 40 mg, 40 mg, Subcutaneous, Q24H, Niu, Xilin, MD, 40 mg at 03/06/24 0830   ezetimibe  (ZETIA ) tablet 10 mg, 10 mg, Oral, Daily, Niu, Xilin, MD, 10 mg at 03/06/24 0830   filgrastim -aafi (NIVESTYM ) injection 480 mcg, 480 mcg, Subcutaneous, q1800, Cap Zelphia, MD, 480 mcg at 03/05/24 1712   loratadine  (CLARITIN ) tablet 10 mg, 10 mg, Oral, Daily, Niu, Xilin, MD, 10 mg at 03/06/24 0831   menthol  (CEPACOL) lozenge 3 mg, 1 lozenge, Oral, PRN, Franchot Novel, MD   ondansetron  (ZOFRAN ) injection 4 mg, 4 mg, Intravenous, Q8H PRN, Niu, Xilin, MD   pantoprazole  (PROTONIX ) EC tablet 40 mg, 40 mg, Oral, Daily, Niu, Xilin, MD, 40 mg at 03/06/24 0830   pravastatin  (PRAVACHOL ) tablet 20 mg, 20 mg, Oral, q1800, Niu, Xilin, MD, 20 mg at 03/05/24 1836   sodium chloride  (OCEAN) 0.65 % nasal spray 1 spray, 1 spray, Each Nare, PRN, Franchot Novel, MD   vancomycin  (VANCOREADY) IVPB 2000 mg/400 mL, 2,000 mg, Intravenous, Q24H, Franchot Novel, MD, Stopped at 03/05/24 2359  "

## 2024-03-07 ENCOUNTER — Other Ambulatory Visit: Payer: Self-pay

## 2024-03-07 ENCOUNTER — Inpatient Hospital Stay

## 2024-03-07 ENCOUNTER — Encounter: Payer: Self-pay | Admitting: Oncology

## 2024-03-07 DIAGNOSIS — R5081 Fever presenting with conditions classified elsewhere: Secondary | ICD-10-CM | POA: Diagnosis not present

## 2024-03-07 DIAGNOSIS — D709 Neutropenia, unspecified: Secondary | ICD-10-CM | POA: Diagnosis not present

## 2024-03-07 LAB — CBC WITH DIFFERENTIAL/PLATELET
Abs Immature Granulocytes: 2.19 K/uL — ABNORMAL HIGH (ref 0.00–0.07)
Basophils Absolute: 0 K/uL (ref 0.0–0.1)
Basophils Relative: 0 %
Eosinophils Absolute: 0.4 K/uL (ref 0.0–0.5)
Eosinophils Relative: 2 %
HCT: 37.1 % — ABNORMAL LOW (ref 39.0–52.0)
Hemoglobin: 12.1 g/dL — ABNORMAL LOW (ref 13.0–17.0)
Immature Granulocytes: 12 %
Lymphocytes Relative: 12 %
Lymphs Abs: 2.2 K/uL (ref 0.7–4.0)
MCH: 27.5 pg (ref 26.0–34.0)
MCHC: 32.6 g/dL (ref 30.0–36.0)
MCV: 84.3 fL (ref 80.0–100.0)
Monocytes Absolute: 3.8 K/uL — ABNORMAL HIGH (ref 0.1–1.0)
Monocytes Relative: 21 %
Neutro Abs: 9.5 K/uL — ABNORMAL HIGH (ref 1.7–7.7)
Neutrophils Relative %: 53 %
Platelets: 231 K/uL (ref 150–400)
RBC: 4.4 MIL/uL (ref 4.22–5.81)
RDW: 13.2 % (ref 11.5–15.5)
Smear Review: NORMAL
WBC: 18.1 K/uL — ABNORMAL HIGH (ref 4.0–10.5)
nRBC: 0.1 % (ref 0.0–0.2)

## 2024-03-07 LAB — BASIC METABOLIC PANEL WITH GFR
Anion gap: 9 (ref 5–15)
BUN: 9 mg/dL (ref 8–23)
CO2: 24 mmol/L (ref 22–32)
Calcium: 9.3 mg/dL (ref 8.9–10.3)
Chloride: 106 mmol/L (ref 98–111)
Creatinine, Ser: 1.14 mg/dL (ref 0.61–1.24)
GFR, Estimated: >60 mL/min
Glucose, Bld: 181 mg/dL — ABNORMAL HIGH (ref 70–99)
Potassium: 3.8 mmol/L (ref 3.5–5.1)
Sodium: 139 mmol/L (ref 135–145)

## 2024-03-07 LAB — PATHOLOGIST SMEAR REVIEW

## 2024-03-07 MED ORDER — VANCOMYCIN HCL IN DEXTROSE 1-5 GM/200ML-% IV SOLN
1000.0000 mg | Freq: Two times a day (BID) | INTRAVENOUS | Status: DC
  Filled 2024-03-07: qty 200

## 2024-03-07 NOTE — Progress Notes (Signed)
 " Progress Note   Patient: Blake Padilla FMW:[id number] DOB: [birth year] DOA: 03/04/2024     3 DOS: the patient was seen and examined on 03/07/2024   Brief hospital course: Per H&P HPI   HPI: Blake Padilla is a 74 y.o. 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   Patient has chills and fever in the past 3 days with temperature up to 102 at home.  Patient has some mild intermittent sore throat, no cough, SOB, chest pain.  No sick contact.  No nausea, vomiting, diarrhea or abdominal pain.  No symptoms UTI.     Patient is sent to ED by her oncologist, Dr. Babara for neutropenia fever. Dr. Babara recommended broad spectrum antibiotics and short acting GCSF daily x 3, and hold off Acalabrutinib .   Data reviewed independently and ED Course: pt was found to have WBC 2.6 with neutrophil 0, slightly worsening renal function, negative UA, negative PCR for COVID, flu and RSV, lactic acid 0.7.  Temperature 99.6 in ED, blood pressure 124/68, HR 77, RR 18, oxygen saturation 97% on room air.  Chest x-ray clear.  Patient is admitted to telemetry bed as inpatient.    Assessment and Plan:  Neutropenic fever No obvious symptoms of infection.  Had some nasal congestion and pharyngitis, respiratory pathogen panel negative, group A strep PCR negative.  UA without evidence of infection.  Chest x-ray without infiltrate. Bcx ngtd. Had fever 1/10 to 101.5 F has since remained afebrile.  ANC now 0.5 -- Continue cefepime  and acyclovir  --Continue G-CSF agent per oncology -- Hold acalabrutinib   Mantle cell lymphoma Patient follows with Dr. Babara, he is currently on rituximab , acalabrutinib  and is on hold due to neutropenia  Hypertension Mostly controlled, continue home amlodipine   Hyperlipidemia Continue lipid-lowering medications   Chronic diastolic  heart failure Appears compensated   Likely not CKD3a GFR>60 this AM     Latest Ref Rng & Units 03/07/2024    9:33 AM 03/06/2024    8:19 AM 03/05/2024    6:13 AM  BMP  Glucose 70 - 99 mg/dL 818  882  865   BUN 8 - 23 mg/dL 9  10  14    Creatinine 0.61 - 1.24 mg/dL 8.85  8.89  8.84   Sodium 135 - 145 mmol/L 139  138  137   Potassium 3.5 - 5.1 mmol/L 3.8  3.7  3.7   Chloride 98 - 111 mmol/L 106  105  106   CO2 22 - 32 mmol/L 24  24  22    Calcium  8.9 - 10.3 mg/dL 9.3  9.1  8.6    Avoid nephrotoxic agents Continue to monitor  CAD, three-vessel coronary artery calcifications on CT chest Follows outpatient with [facility] cardiology   History of abdominal aortic aneurysm repair  Overweight BMI 29 Complicates care      Subjective: No headache or sore throat.  Physical Exam: Vitals:   03/06/24 1954 03/07/24 0417 03/07/24 1021 03/07/24 1634  BP: 126/67 124/78 123/72 (!) 109/56  Pulse: 77 87 79 76  Resp: 16 18 18 18   Temp: 98.6 F (37 C) 98.5 F (36.9 C) 98.2 F (36.8 C) 98.8 F (37.1 C)  TempSrc:   Oral Oral  SpO2: 97% 98% 97% 99%  Weight:      Height:        Constitutional: In no distress.  Cardiovascular: Normal rate, regular rhythm. No lower extremity edema  Pulmonary: Non labored breathing on room air, no wheezing or rales.   Abdominal: Soft. Non distended and non tender Musculoskeletal: Normal range of motion.     Neurological: Alert and oriented to person, place, and time. Non focal  Skin: Skin is warm and dry.   Data Reviewed:     Latest Ref Rng & Units 03/07/2024    9:33 AM 03/06/2024    8:19 AM 03/05/2024    6:13 AM  BMP  Glucose 70 - 99 mg/dL 818  882  865   BUN 8 - 23 mg/dL 9  10  14    Creatinine 0.61 - 1.24 mg/dL 8.85  8.89  8.84   Sodium 135 - 145 mmol/L 139  138  137   Potassium 3.5 - 5.1 mmol/L 3.8  3.7  3.7   Chloride 98 - 111 mmol/L 106  105  106   CO2 22 - 32 mmol/L 24  24  22    Calcium  8.9 - 10.3 mg/dL 9.3  9.1  8.6       Latest Ref Rng &  Units 03/07/2024    9:33 AM 03/06/2024    8:19 AM 03/05/2024    6:13 AM  CBC  WBC 4.0 - 10.5 K/uL 18.1  5.6  3.5   Hemoglobin 13.0 - 17.0 g/dL 87.8  88.4  88.7   Hematocrit 39.0 - 52.0 % 37.1  36.7  34.8   Platelets 150 - 400 K/uL 231  190  184      Family Communication: None at bedside  Disposition: Status is: Inpatient Remains inpatient appropriate because: IV antibiotics for febrile neutropenia  Planned Discharge Destination: Home    Time spent: 35 minutes  Author: Alban Pepper, MD 03/07/2024 4:41 PM  For on call review [email or web address].  "

## 2024-03-07 NOTE — Progress Notes (Signed)
 Pharmacy Antibiotic Note  Blake Padilla is a 74 y.o. male admitted on 03/04/2024 with febrile neutropenia .  Pharmacy has been consulted for Cefepime  and Vancomycin  dosing.  Plan: Change vancomycin  to 1000 mg IV every 12 hours Estimated AUC 423.4, Cmin 12.6 IBW, Scr 1.1, Vd 0.72 Vancomycin  levels at steady state or as clinically indicated Continue cefepime  2 grams IV every 8 hours Follow renal function and cultures for adjustments  Temp (24hrs), Avg:98.6 F (37 C), Min:98.5 F (36.9 C), Max:98.6 F (37 C)   Recent Labs  Lab 03/04/24 1912 03/04/24 2039 03/05/24 0613 03/06/24 0819  WBC 2.6*  --  3.5* 5.6  CREATININE 1.38*  --  1.15 1.10  LATICACIDVEN  --  0.7  --   --     Estimated Creatinine Clearance: 77.1 mL/min (by C-G formula based on SCr of 1.1 mg/dL).    Allergies[1]  Antimicrobials this admission: 1/09 Cefepime  >>  1/09 Flagyl  >> x 1 dose 1/09 Vancomycin  >>   Microbiology results: 1/09 BCx: ngtd  Thank you for allowing pharmacy to be a part of this patients care.  Kayla Mose Blew III, PharmD, BCPS 03/07/2024 8:53 AM      [1] No Known Allergies

## 2024-03-07 NOTE — Progress Notes (Signed)
 TOC Brief note  Patient admitted with neutropenia, WBC 5.6 and neutrophils 8. Continuing IV abx until ANC.1 The medical record has been reviewed. The patient is from home. The patient has a payor and PCP on record. No SDOH interventions needed.   No recs at this time, please outreach to [facility] if needs are identified.

## 2024-03-07 NOTE — Care Management Important Message (Signed)
 Important Message  Patient Details  Name: Blake Padilla MRN: [id number] Date of Birth: [birth year]   Important Message Given:  Yes - Medicare IM     Allsion Nogales W, CMA 03/07/2024, 3:14 PM

## 2024-03-08 ENCOUNTER — Other Ambulatory Visit: Payer: Self-pay

## 2024-03-08 LAB — CBC WITH DIFFERENTIAL/PLATELET
Abs Immature Granulocytes: 4.35 K/uL — ABNORMAL HIGH (ref 0.00–0.07)
Basophils Absolute: 0 K/uL (ref 0.0–0.1)
Basophils Relative: 0 %
Eosinophils Absolute: 0.3 K/uL (ref 0.0–0.5)
Eosinophils Relative: 1 %
HCT: 35.4 % — ABNORMAL LOW (ref 39.0–52.0)
Hemoglobin: 11.4 g/dL — ABNORMAL LOW (ref 13.0–17.0)
Immature Granulocytes: 18 %
Lymphocytes Relative: 13 %
Lymphs Abs: 3 K/uL (ref 0.7–4.0)
MCH: 26.9 pg (ref 26.0–34.0)
MCHC: 32.2 g/dL (ref 30.0–36.0)
MCV: 83.5 fL (ref 80.0–100.0)
Monocytes Absolute: 3.7 K/uL — ABNORMAL HIGH (ref 0.1–1.0)
Monocytes Relative: 15 %
Neutro Abs: 12.4 K/uL — ABNORMAL HIGH (ref 1.7–7.7)
Neutrophils Relative %: 53 %
Platelets: 231 K/uL (ref 150–400)
RBC: 4.24 MIL/uL (ref 4.22–5.81)
RDW: 13.2 % (ref 11.5–15.5)
Smear Review: NORMAL
WBC: 23.8 K/uL — ABNORMAL HIGH (ref 4.0–10.5)
nRBC: 0.4 % — ABNORMAL HIGH (ref 0.0–0.2)

## 2024-03-08 LAB — BASIC METABOLIC PANEL WITH GFR
Anion gap: 9 (ref 5–15)
BUN: 10 mg/dL (ref 8–23)
CO2: 25 mmol/L (ref 22–32)
Calcium: 9.1 mg/dL (ref 8.9–10.3)
Chloride: 106 mmol/L (ref 98–111)
Creatinine, Ser: 1.09 mg/dL (ref 0.61–1.24)
GFR, Estimated: >60 mL/min
Glucose, Bld: 103 mg/dL — ABNORMAL HIGH (ref 70–99)
Potassium: 3.6 mmol/L (ref 3.5–5.1)
Sodium: 141 mmol/L (ref 135–145)

## 2024-03-08 MED ORDER — DM-GUAIFENESIN ER 30-600 MG PO TB12: 1.0000 | ORAL_TABLET | Freq: Two times a day (BID) | ORAL | Status: AC | PRN

## 2024-03-08 MED ORDER — SALINE SPRAY 0.65 % NA SOLN: 1.0000 | NASAL | Status: AC | PRN

## 2024-03-08 NOTE — TOC Progression Note (Signed)
 Transition of Care [facility]) - Progression Note    Patient Details  Name: Blake Padilla MRN: [id number] Date of Birth: 1950/09/26  Transition of Care [facility]) CM/SW Contact  Dalia GORMAN Fuse, RN Phone Number: 03/08/2024, 10:33 AM  Clinical Narrative:     Continuing IV abx until ANC.1. ANC 0.5 yesterday. Continue G-CSF   No TOC needs at this time.                     Expected Discharge Plan and Services                                               Social Drivers of Health (SDOH) Interventions SDOH Screenings   Food Insecurity: No Food Insecurity (03/04/2024)  Housing: Low Risk (03/04/2024)  Transportation Needs: No Transportation Needs (03/04/2024)  Utilities: Not At Risk (03/04/2024)  Depression (PHQ2-9): Low Risk (02/26/2024)  Financial Resource Strain: Low Risk (10/06/2023)  Social Connections: Moderately Integrated (03/04/2024)  Stress: No Stress Concern Present (10/06/2023)  Tobacco Use: Medium Risk (03/05/2024)  Health Literacy: Adequate Health Literacy (10/06/2023)    Readmission Risk Interventions    09/15/2023    9:02 AM  Readmission Risk Prevention Plan  Post Dischage Appt Complete  Medication Screening Complete  Transportation Screening Complete

## 2024-03-08 NOTE — Discharge Instructions (Signed)
 Please continue to monitor for fever, cough, burning or pain with urination.

## 2024-03-09 ENCOUNTER — Other Ambulatory Visit: Payer: Self-pay

## 2024-03-09 LAB — CULTURE, BLOOD (ROUTINE X 2)
Culture: NO GROWTH
Culture: NO GROWTH
Special Requests: ADEQUATE
Special Requests: ADEQUATE

## 2024-03-10 NOTE — Discharge Summary (Signed)
 " Physician Discharge Summary   Patient: Blake Padilla MRN: [id number] DOB: July 31, 1950  Admit date:     03/04/2024  Discharge date: 03/08/2024  Discharge Physician: Alban Pepper MD   PCP: Buren Rock HERO, MD   Recommendations at discharge:    F/u with oncology   Discharge Diagnoses: Principal Problem:   Neutropenia with fever Active Problems:   Mantle cell lymphoma (HCC)   HTN (hypertension)   Hyperlipidemia   Coronary artery disease   Chronic diastolic CHF (congestive heart failure) (HCC)   Chronic kidney disease, stage 3a (HCC)   Overweight (BMI 25.0-29.9)  Resolved Problems:   * No resolved hospital problems. *  Hospital Course: Per H&P HPI    HPI: Blake Padilla is a 74 y.o. 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   Patient has chills and fever in the past 3 days with temperature up to 102 at home.  Patient has some mild intermittent sore throat, no cough, SOB, chest pain.  No sick contact.  No nausea, vomiting, diarrhea or abdominal pain.  No symptoms UTI.     Patient is sent to ED by her oncologist, Dr. Babara for neutropenia fever. Dr. Babara recommended broad spectrum antibiotics and short acting GCSF daily x 3, and hold off Acalabrutinib .   Data reviewed independently and ED Course: pt was found to have WBC 2.6 with neutrophil 0, slightly worsening renal function, negative UA, negative PCR for COVID, flu and RSV, lactic acid 0.7.  Temperature 99.6 in ED, blood pressure 124/68, HR 77, RR 18, oxygen saturation 97% on room air.  Chest x-ray clear.  Patient is admitted to telemetry bed as inpatient.    Assessment and Plan: Neutropenic fever No obvious symptoms of infection.  Had some nasal congestion and pharyngitis, respiratory pathogen panel negative, group A strep PCR negative.  UA without evidence of  infection.  Chest x-ray without infiltrate. Bcx ngtd. Had fever 1/10 to 101.5 F has since remained afebrile. ANC increased with GCSF per oncology. Patient was on broad spectrum antibiotics until ANC improved and he remained without fever.   He will continue to hold acalabrutinib . He will have close follow up with oncology.    Mantle cell lymphoma Patient follows with Dr. Babara, he is currently on rituximab , acalabrutinib , which is on hold due to neutropenia   Hypertension Mostly controlled, continue home amlodipine    Hyperlipidemia Continue lipid-lowering medications     Chronic diastolic heart failure Appears compensated     Likely not CKD3a GFR>60     Latest Ref Rng & Units 03/08/2024    5:06 AM 03/07/2024    9:33 AM 03/06/2024    8:19 AM  BMP  Glucose 70 - 99 mg/dL 896  818  882   BUN 8 - 23 mg/dL 10  9  10    Creatinine 0.61 - 1.24 mg/dL 8.90  8.85  8.89   Sodium 135 - 145 mmol/L 141  139  138   Potassium 3.5 - 5.1 mmol/L 3.6  3.8  3.7   Chloride 98 - 111 mmol/L 106  106  105   CO2 22 - 32 mmol/L 25  24  24    Calcium  8.9 - 10.3 mg/dL 9.1  9.3  9.1      CAD, three-vessel coronary artery calcifications on CT chest Follows outpatient with [facility] cardiology  History of abdominal aortic aneurysm repair   Overweight BMI 29 Complicates care      Consultants: Oncology Procedures performed: None  Disposition: Home Diet recommendation:  Discharge Diet Orders (From admission, onward)     Start     Ordered   03/08/24 0000  Diet general        03/08/24 1241           Regular diet DISCHARGE MEDICATION: Allergies as of 03/08/2024   No Known Allergies      Medication List     PAUSE taking these medications    Calquence  100 MG tablet Wait to take this until your doctor or other care provider tells you to start again. Generic drug: acalabrutinib  maleate Take 1 tablet (100 mg total) by mouth 2 (two) times daily.       TAKE these medications     acyclovir  400 MG tablet Commonly known as: ZOVIRAX  Take 1 tablet (400 mg total) by mouth daily.   amLODipine  5 MG tablet Commonly known as: NORVASC  Take 1 tablet (5 mg total) by mouth daily.   aspirin  EC 81 MG tablet Take 1 tablet (81 mg total) by mouth daily. Swallow whole.   cetirizine 10 MG tablet Commonly known as: ZYRTEC Take 10 mg by mouth daily.   dextromethorphan-guaiFENesin  30-600 MG 12hr tablet Commonly known as: MUCINEX  DM Take 1 tablet by mouth 2 (two) times daily as needed for cough.   ezetimibe  10 MG tablet Commonly known as: ZETIA  Take 1 tablet (10 mg total) by mouth daily.   NexIUM 40 MG capsule Generic drug: esomeprazole Take 40 mg by mouth daily.   pravastatin  20 MG tablet Commonly known as: PRAVACHOL  Take 1 tablet (20 mg total) by mouth daily.   sodium chloride  0.65 % Soln nasal spray Commonly known as: OCEAN Place 1 spray into both nostrils as needed for congestion.        Follow-up Information     Buren Rock HERO, MD Follow up.   Specialty: Family Medicine Why: hospital follow up Contact information: [address] [place] [address] [phone]                Discharge Exam: Fredricka Weights   03/04/24 1908 03/06/24 0500  Weight: 106.1 kg 104.4 kg   Physical Exam  Constitutional: In no distress.  Cardiovascular: Normal rate, regular rhythm. No lower extremity edema  Pulmonary: Non labored breathing on room air, no wheezing or rales.   Abdominal: Soft. Non distended and non tender Musculoskeletal: Normal range of motion.     Neurological: Alert and oriented to person, place, and time. Non focal  Skin: Skin is warm and dry.    Condition at discharge: good  The results of significant diagnostics from this hospitalization (including imaging, microbiology, ancillary and laboratory) are listed below for reference.   Imaging Studies: DG Chest Port 1 View Result Date: 03/04/2024 EXAM: 1 VIEW(S) XRAY OF THE CHEST  03/04/2024 07:39:07 PM COMPARISON: 09/22/2023 CLINICAL HISTORY: Questionable sepsis - evaluate for abnormality FINDINGS: LUNGS AND PLEURA: No focal pulmonary opacity. No pleural effusion. No pneumothorax. HEART AND MEDIASTINUM: Atherosclerotic plaque. No acute abnormality of the cardiac and mediastinal silhouettes. BONES AND SOFT TISSUES: No acute osseous abnormality. IMPRESSION: 1. No acute cardiopulmonary abnormality. 2. Atherosclerotic plaque. Electronically signed by: Dorethia Molt MD MD 03/04/2024 07:48 PM EST RP Workstation: [id number]   US  RENAL Result Date: 02/18/2024 EXAM: US  Retroperitoneum Complete, Renal. 02/17/2024 09:04:08 AM TECHNIQUE: Real-time ultrasonography of the retroperitoneum renal was performed.  COMPARISON: 01/01/2021 CLINICAL HISTORY: Elevated creatinine. FINDINGS: LIVER: Increased echotexture of the liver. RIGHT KIDNEY/URETER: Right kidney measures 11.7 x 4.2 x 6.4 cm. Normal cortical echogenicity. No hydronephrosis. No calculus. No mass. Small interpolar region cyst measuring 1.3 x 0.9 cm. LEFT KIDNEY/URETER: Left kidney measures 13.3 x 6.6 x 5.5 cm. Normal cortical echogenicity. No hydronephrosis. No calculus. No mass. Left renal cyst on the prior CT was not well visualized on today's ultrasound. BLADDER: The urinary bladder is completely decompressed. IMPRESSION: 1. No hydronephrosis or nephrolithiasis. 2. Hepatic steatosis. Electronically signed by: Rogelia Myers MD 02/18/2024 10:32 PM EST RP Workstation: [id number]    Microbiology: Results for orders placed or performed during the hospital encounter of 03/04/24  Resp panel by RT-PCR (RSV, Flu A&B, Covid) Anterior Nasal Swab     Status: None   Collection Time: 03/04/24  8:39 PM   Specimen: Anterior Nasal Swab  Result Value Ref Range Status   SARS Coronavirus 2 by RT PCR NEGATIVE NEGATIVE Final    Comment: (NOTE) SARS-CoV-2 target nucleic acids are NOT DETECTED.  The SARS-CoV-2 RNA is generally detectable in upper  respiratory specimens during the acute phase of infection. The lowest concentration of SARS-CoV-2 viral copies this assay can detect is 138 copies/mL. A negative result does not preclude SARS-Cov-2 infection and should not be used as the sole basis for treatment or other patient management decisions. A negative result may occur with  improper specimen collection/handling, submission of specimen other than nasopharyngeal swab, presence of viral mutation(s) within the areas targeted by this assay, and inadequate number of viral copies(<138 copies/mL). A negative result must be combined with clinical observations, patient history, and epidemiological information. The expected result is Negative.  Fact Sheet for Patients:  bloggercourse.com  Fact Sheet for Healthcare Providers:  seriousbroker.it  This test is no t yet approved or cleared by the [place]  FDA and  has been authorized for detection and/or diagnosis of SARS-CoV-2 by FDA under an Emergency Use Authorization (EUA). This EUA will remain  in effect (meaning this test can be used) for the duration of the COVID-19 declaration under Section 564(b)(1) of the Act, 21 U.S.C.section 360bbb-3(b)(1), unless the authorization is terminated  or revoked sooner.       Influenza A by PCR NEGATIVE NEGATIVE Final   Influenza B by PCR NEGATIVE NEGATIVE Final    Comment: (NOTE) The Xpert Xpress SARS-CoV-2/FLU/RSV plus assay is intended as an aid in the diagnosis of influenza from Nasopharyngeal swab specimens and should not be used as a sole basis for treatment. Nasal washings and aspirates are unacceptable for Xpert Xpress SARS-CoV-2/FLU/RSV testing.  Fact Sheet for Patients: bloggercourse.com  Fact Sheet for Healthcare Providers: seriousbroker.it  This test is not yet approved or cleared by the [place]  FDA and has been  authorized for detection and/or diagnosis of SARS-CoV-2 by FDA under an Emergency Use Authorization (EUA). This EUA will remain in effect (meaning this test can be used) for the duration of the COVID-19 declaration under Section 564(b)(1) of the Act, 21 U.S.C. section 360bbb-3(b)(1), unless the authorization is terminated or revoked.     Resp Syncytial Virus by PCR NEGATIVE NEGATIVE Final    Comment: (NOTE) Fact Sheet for Patients: bloggercourse.com  Fact Sheet for Healthcare Providers: seriousbroker.it  This test is not yet approved or cleared by the [place]  FDA and has been authorized for detection and/or diagnosis of SARS-CoV-2 by FDA under an Emergency Use Authorization (EUA). This EUA will remain in effect (meaning this test  can be used) for the duration of the COVID-19 declaration under Section 564(b)(1) of the Act, 21 U.S.C. section 360bbb-3(b)(1), unless the authorization is terminated or revoked.  Performed at [facility], [address] [place], [place] [address]   Blood Culture (routine x 2)     Status: None   Collection Time: 03/04/24  8:40 PM   Specimen: BLOOD  Result Value Ref Range Status   Specimen Description BLOOD BLOOD RIGHT ARM  Final   Special Requests   Final    BOTTLES DRAWN AEROBIC AND ANAEROBIC Blood Culture adequate volume   Culture   Final    NO GROWTH 5 DAYS Performed at [facility], [address] [place], [place] [address]    Report Status 03/09/2024 FINAL  Final  Respiratory (~20 pathogens) panel by PCR     Status: None   Collection Time: 03/04/24  8:40 PM   Specimen: Nasopharyngeal Swab; Respiratory  Result Value Ref Range Status   Adenovirus NOT DETECTED NOT DETECTED Final   Coronavirus 229E NOT DETECTED NOT DETECTED Final    Comment: (NOTE) The Coronavirus on the Respiratory Panel, DOES NOT test for the novel  Coronavirus (2019 nCoV)    Coronavirus HKU1 NOT  DETECTED NOT DETECTED Final   Coronavirus NL63 NOT DETECTED NOT DETECTED Final   Coronavirus OC43 NOT DETECTED NOT DETECTED Final   Metapneumovirus NOT DETECTED NOT DETECTED Final   Rhinovirus / Enterovirus NOT DETECTED NOT DETECTED Final   Influenza A NOT DETECTED NOT DETECTED Final   Influenza B NOT DETECTED NOT DETECTED Final   Parainfluenza Virus 1 NOT DETECTED NOT DETECTED Final   Parainfluenza Virus 2 NOT DETECTED NOT DETECTED Final   Parainfluenza Virus 3 NOT DETECTED NOT DETECTED Final   Parainfluenza Virus 4 NOT DETECTED NOT DETECTED Final   Respiratory Syncytial Virus NOT DETECTED NOT DETECTED Final   Bordetella pertussis NOT DETECTED NOT DETECTED Final   Bordetella Parapertussis NOT DETECTED NOT DETECTED Final   Chlamydophila pneumoniae NOT DETECTED NOT DETECTED Final   Mycoplasma pneumoniae NOT DETECTED NOT DETECTED Final    Comment: Performed at [facility], [address] [place], [place] [address]  Blood Culture (routine x 2)     Status: None   Collection Time: 03/04/24  8:47 PM   Specimen: BLOOD  Result Value Ref Range Status   Specimen Description BLOOD BLOOD LEFT ARM  Final   Special Requests   Final    BOTTLES DRAWN AEROBIC AND ANAEROBIC Blood Culture adequate volume   Culture   Final    NO GROWTH 5 DAYS Performed at [facility], [address] [place], [place] [address]    Report Status 03/09/2024 FINAL  Final  Group A Strep by PCR     Status: None   Collection Time: 03/05/24  2:30 AM   Specimen: Throat; Sterile Swab  Result Value Ref Range Status   Group A Strep by PCR NOT DETECTED NOT DETECTED Final    Comment: Performed at [facility], [address] [place], [place] [address]    Labs: CBC: Recent Labs  Lab 03/04/24 1912 03/05/24 9386 03/06/24 0819 03/07/24 0933 03/08/24 0506  WBC 2.6* 3.5* 5.6 18.1* 23.8*  NEUTROABS 0.0*  --  0.4* 9.5* 12.4*  HGB 11.7* 11.2* 11.5* 12.1* 11.4*  HCT 35.9* 34.8* 36.7* 37.1* 35.4*   MCV 84.7 85.1 85.5 84.3 83.5  PLT 192 184 190 231 231   Basic Metabolic Panel: Recent Labs  Lab 03/04/24  1912 03/05/24 0613 03/06/24 0819 03/07/24 0933 03/08/24 0506  NA 138 137 138 139 141  K 3.9 3.7 3.7 3.8 3.6  CL 104 106 105 106 106  CO2 21* 22 24 24 25   GLUCOSE 135* 134* 117* 181* 103*  BUN 17 14 10 9 10   CREATININE 1.38* 1.15 1.10 1.14 1.09  CALCIUM  8.7* 8.6* 9.1 9.3 9.1   Liver Function Tests: Recent Labs  Lab 03/04/24 1912  AST 26  ALT 9  ALKPHOS 99  BILITOT 1.2  PROT 7.3  ALBUMIN 3.9   CBG: No results for input(s): GLUCAP in the last 168 hours.  Discharge time spent: greater than 30 minutes.  Signed: Alban Pepper, MD [facility] 03/10/2024 "

## 2024-03-15 IMAGING — DX DG HIP (WITH OR WITHOUT PELVIS) 2-3V*L*
2 series · 2 of 2 positions shown · non-contrast
Comparison: Left hip fluoroscopy images 06/11/2021

CLINICAL DATA: Status post total left hip arthroplasty.

EXAM:
DG HIP (WITH OR WITHOUT PELVIS) 2-3V LEFT

[hip ap]
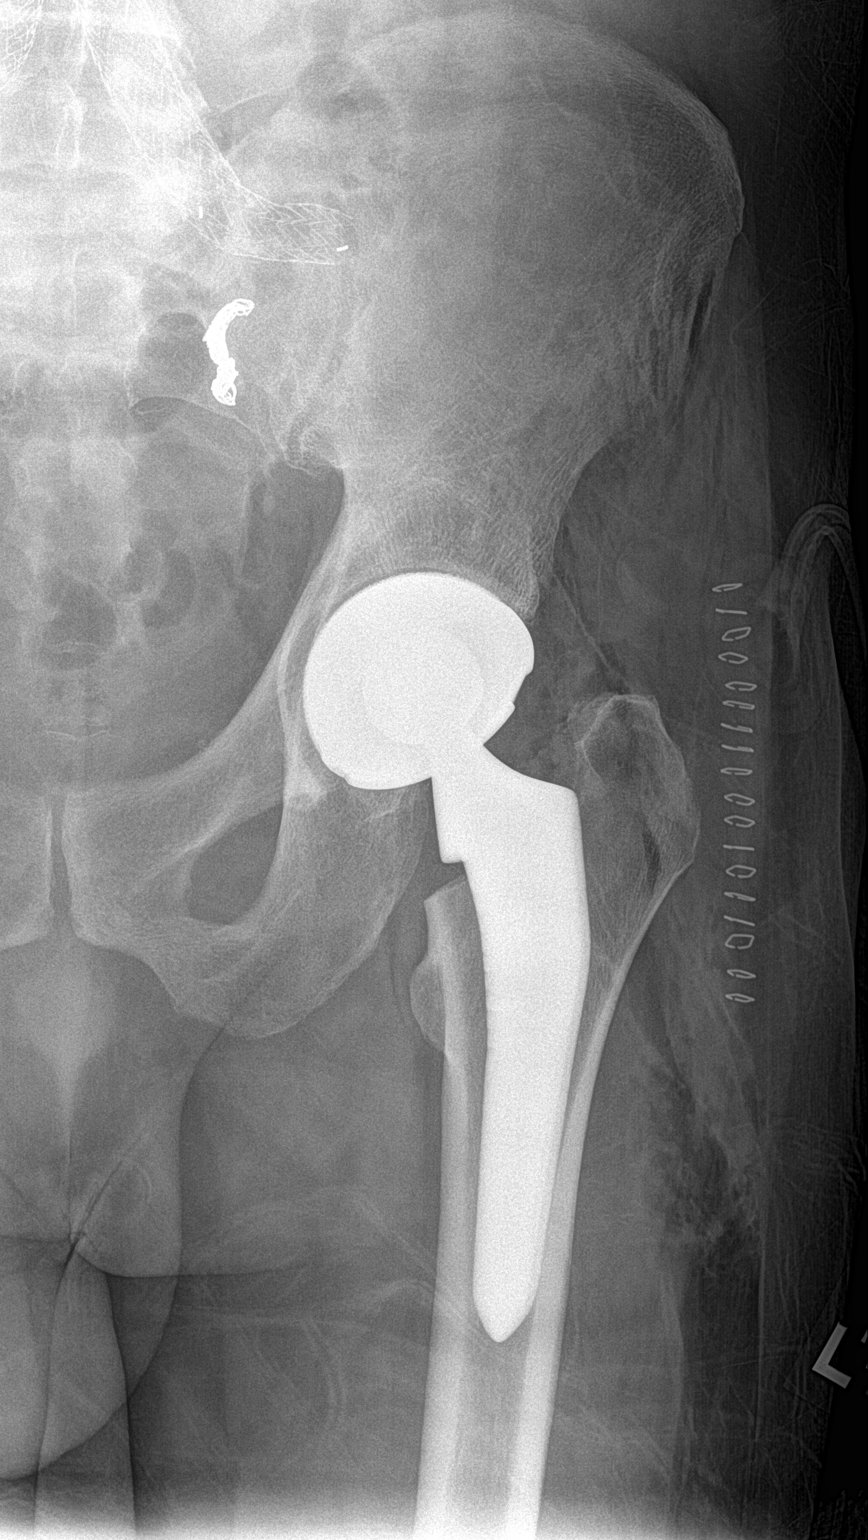

[hip lat]
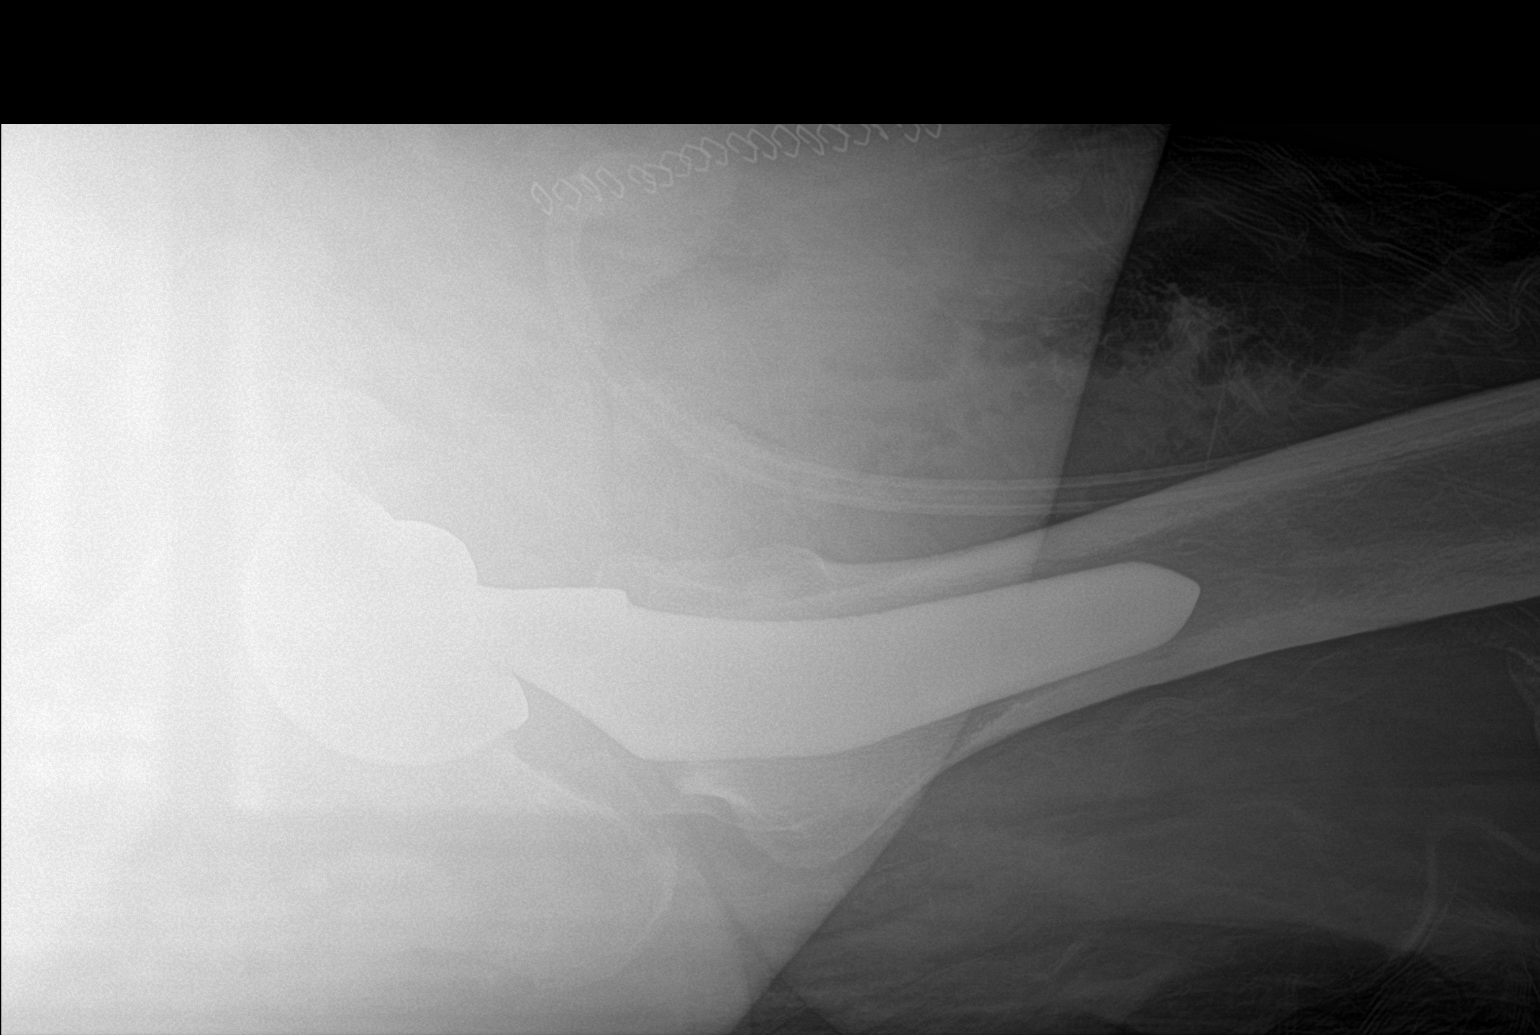

[2 of 2 positions shown; findings below may reference images not displayed]

FINDINGS: Interval total left hip arthroplasty. No perihardware lucency is
seen to indicate hardware failure or loosening. Expected
postoperative changes including lateral left hip soft tissue
swelling and subcutaneous air. Lateral surgical skin staples and
soft tissue drainage catheter. No acute fracture. Partial
visualization of bi-iliac vascular stents and vascular coils
overlying left sacrum.
IMPRESSION: Status post total left hip arthroplasty without evidence of hardware
failure.

## 2024-03-18 ENCOUNTER — Other Ambulatory Visit: Payer: Self-pay

## 2024-03-25 ENCOUNTER — Inpatient Hospital Stay

## 2024-03-25 ENCOUNTER — Inpatient Hospital Stay: Admitting: Oncology

## 2024-03-25 ENCOUNTER — Encounter: Payer: Self-pay | Admitting: Oncology

## 2024-03-25 VITALS — BP 127/66 | HR 67 | Temp 97.8°F | Resp 18 | Wt 228.8 lb

## 2024-03-25 VITALS — BP 118/60 | HR 71 | Temp 98.1°F | Resp 16

## 2024-03-25 DIAGNOSIS — C8310 Mantle cell lymphoma, unspecified site: Secondary | ICD-10-CM

## 2024-03-25 DIAGNOSIS — Z5112 Encounter for antineoplastic immunotherapy: Secondary | ICD-10-CM | POA: Diagnosis not present

## 2024-03-25 DIAGNOSIS — E538 Deficiency of other specified B group vitamins: Secondary | ICD-10-CM | POA: Diagnosis not present

## 2024-03-25 DIAGNOSIS — D649 Anemia, unspecified: Secondary | ICD-10-CM

## 2024-03-25 LAB — DIFFERENTIAL
Abs Immature Granulocytes: 0.01 10*3/uL (ref 0.00–0.07)
Basophils Absolute: 0 10*3/uL (ref 0.0–0.1)
Basophils Relative: 1 %
Eosinophils Absolute: 0.1 10*3/uL (ref 0.0–0.5)
Eosinophils Relative: 2 %
Immature Granulocytes: 0 %
Lymphocytes Relative: 32 %
Lymphs Abs: 1.7 10*3/uL (ref 0.7–4.0)
Monocytes Absolute: 0.5 10*3/uL (ref 0.1–1.0)
Monocytes Relative: 10 %
Neutro Abs: 2.8 10*3/uL (ref 1.7–7.7)
Neutrophils Relative %: 55 %

## 2024-03-25 LAB — CMP (CANCER CENTER ONLY)
ALT: 6 U/L (ref 0–44)
AST: 16 U/L (ref 15–41)
Albumin: 3.9 g/dL (ref 3.5–5.0)
Alkaline Phosphatase: 111 U/L (ref 38–126)
Anion gap: 10 (ref 5–15)
BUN: 17 mg/dL (ref 8–23)
CO2: 25 mmol/L (ref 22–32)
Calcium: 9 mg/dL (ref 8.9–10.3)
Chloride: 106 mmol/L (ref 98–111)
Creatinine: 1.14 mg/dL (ref 0.61–1.24)
GFR, Estimated: >60 mL/min
Glucose, Bld: 124 mg/dL — ABNORMAL HIGH (ref 70–99)
Potassium: 4.1 mmol/L (ref 3.5–5.1)
Sodium: 140 mmol/L (ref 135–145)
Total Bilirubin: 0.5 mg/dL (ref 0.0–1.2)
Total Protein: 7.2 g/dL (ref 6.5–8.1)

## 2024-03-25 LAB — CBC (CANCER CENTER ONLY)
HCT: 38.6 % — ABNORMAL LOW (ref 39.0–52.0)
Hemoglobin: 12.2 g/dL — ABNORMAL LOW (ref 13.0–17.0)
MCH: 27 pg (ref 26.0–34.0)
MCHC: 31.6 g/dL (ref 30.0–36.0)
MCV: 85.4 fL (ref 80.0–100.0)
Platelet Count: 243 10*3/uL (ref 150–400)
RBC: 4.52 MIL/uL (ref 4.22–5.81)
RDW: 14.1 % (ref 11.5–15.5)
WBC Count: 4.8 10*3/uL (ref 4.0–10.5)
nRBC: 0 % (ref 0.0–0.2)

## 2024-03-25 MED ORDER — SODIUM CHLORIDE 0.9 % IV SOLN
375.0000 mg/m2 | Freq: Once | INTRAVENOUS | Status: AC
  Administered 2024-03-25: 900 mg via INTRAVENOUS
  Filled 2024-03-25: qty 50

## 2024-03-25 MED ORDER — SODIUM CHLORIDE 0.9 % IV SOLN
INTRAVENOUS | Status: DC
  Filled 2024-03-25: qty 250

## 2024-03-25 MED ORDER — ACETAMINOPHEN 325 MG PO TABS
650.0000 mg | ORAL_TABLET | Freq: Once | ORAL | Status: AC
  Administered 2024-03-25: 650 mg via ORAL
  Filled 2024-03-25: qty 2

## 2024-03-25 MED ORDER — DIPHENHYDRAMINE HCL 25 MG PO TABS
50.0000 mg | ORAL_TABLET | Freq: Once | ORAL | Status: AC
  Administered 2024-03-25: 50 mg via ORAL
  Filled 2024-03-25: qty 2

## 2024-03-25 MED ORDER — LORAZEPAM 2 MG/ML IJ SOLN
0.5000 mg | INTRAMUSCULAR | Status: DC | PRN
  Administered 2024-03-25: 0.5 mg via INTRAVENOUS
  Filled 2024-03-25: qty 1

## 2024-03-25 NOTE — Progress Notes (Signed)
Pt here for follow up. No new concerns.

## 2024-03-25 NOTE — Assessment & Plan Note (Addendum)
 PET scan and biopsy pathology results reviewed and discussed with patient. 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  Will further discuss with pathologist  and add on prognostic factors including IGHV, SOX11, TP53     Normal LDH. MIPI score is 6.5,  high risk. Negative hepatitis and HIV. TP53 negative.  He does not have significant GI symptoms except chronic acid reflux. He defers EGD and colonoscopy to rule out occult GI MCL.  Patient has high MIPI score, low Ki67. Mantle cell lymphoma involving pericardia fat and mediastinal lymph nodes.  Currently on Acalabrutinib  [on hold due to neutropenia]  plus Rituximab .  Labs are reviewed and discussed with patient. S/p weekly Rituximab  x 4. Continue monthly x 12 months and subsequently once every 2 months totaling 24 months. Proceed with Rituximab  today ANC has recovered. Consider to resume Acalabrutinib  at a lower dosage at 100mg  daily at next visit.  Recommend shingle prophylaxis with Acyclovir  400mg  daily

## 2024-03-25 NOTE — Assessment & Plan Note (Signed)
 stable hemoglobin level. Monitor.

## 2024-03-25 NOTE — Patient Instructions (Addendum)
 Rituximab Injection What is this medication? RITUXIMAB (ri TUX i mab) treats leukemia and lymphoma. It works by blocking a protein that causes cancer cells to grow and multiply. This helps to slow or stop the spread of cancer cells. It may also be used to treat autoimmune conditions, such as arthritis. It works by slowing down an overactive immune system. It is a monoclonal antibody. This medicine may be used for other purposes; ask your health care provider or pharmacist if you have questions. COMMON BRAND NAME(S): RIABNI, Rituxan, RUXIENCE, truxima What should I tell my care team before I take this medication? They need to know if you have any of these conditions: Chest pain Heart disease Immune system problems Infection, such as chickenpox, cold sores, hepatitis B, herpes Irregular heartbeat or rhythm Kidney disease Low blood counts, such as low white cells, platelets, red cells Lung disease Recent or upcoming vaccine An unusual or allergic reaction to rituximab, other medications, foods, dyes, or preservatives Pregnant or trying to get pregnant Breast-feeding How should I use this medication? This medication is injected into a vein. It is given by a care team in a hospital or clinic setting. A special MedGuide will be given to you before each treatment. Be sure to read this information carefully each time. Talk to your care team about the use of this medication in children. While this medication may be prescribed for children as young as 6 months for selected conditions, precautions do apply. Overdosage: If you think you have taken too much of this medicine contact a poison control center or emergency room at once. NOTE: This medicine is only for you. Do not share this medicine with others. What if I miss a dose? Keep appointments for follow-up doses. It is important not to miss your dose. Call your care team if you are unable to keep an appointment. What may interact with this  medication? Do not take this medication with any of the following: Live vaccines This medication may also interact with the following: Cisplatin This list may not describe all possible interactions. Give your health care provider a list of all the medicines, herbs, non-prescription drugs, or dietary supplements you use. Also tell them if you smoke, drink alcohol, or use illegal drugs. Some items may interact with your medicine. What should I watch for while using this medication? Your condition will be monitored carefully while you are receiving this medication. You may need blood work while taking this medication. This medication can cause serious infusion reactions. To reduce the risk your care team may give you other medications to take before receiving this one. Be sure to follow the directions from your care team. This medication may increase your risk of getting an infection. Call your care team for advice if you get a fever, chills, sore throat, or other symptoms of a cold or flu. Do not treat yourself. Try to avoid being around people who are sick. Call your care team if you are around anyone with measles, chickenpox, or if you develop sores or blisters that do not heal properly. Avoid taking medications that contain aspirin, acetaminophen, ibuprofen, naproxen, or ketoprofen unless instructed by your care team. These medications may hide a fever. This medication may cause serious skin reactions. They can happen weeks to months after starting the medication. Contact your care team right away if you notice fevers or flu-like symptoms with a rash. The rash may be red or purple and then turn into blisters or peeling of the skin.  You may also notice a red rash with swelling of the face, lips, or lymph nodes in your neck or under your arms. In some patients, this medication may cause a serious brain infection that may cause death. If you have any problems seeing, thinking, speaking, walking, or  standing, tell your care team right away. If you cannot reach your care team, urgently seek another source of medical care. Talk to your care team if you may be pregnant. Serious birth defects can occur if you take this medication during pregnancy and for 12 months after the last dose. You will need a negative pregnancy test before starting this medication. Contraception is recommended while taking this medication and for 12 months after the last dose. Your care team can help you find the option that works for you. Do not breastfeed while taking this medication and for at least 6 months after the last dose. What side effects may I notice from receiving this medication? Side effects that you should report to your care team as soon as possible: Allergic reactions or angioedema--skin rash, itching or hives, swelling of the face, eyes, lips, tongue, arms, or legs, trouble swallowing or breathing Bowel blockage--stomach cramping, unable to have a bowel movement or pass gas, loss of appetite, vomiting Dizziness, loss of balance or coordination, confusion or trouble speaking Heart attack--pain or tightness in the chest, shoulders, arms, or jaw, nausea, shortness of breath, cold or clammy skin, feeling faint or lightheaded Heart rhythm changes--fast or irregular heartbeat, dizziness, feeling faint or lightheaded, chest pain, trouble breathing Infection--fever, chills, cough, sore throat, wounds that don't heal, pain or trouble when passing urine, general feeling of discomfort or being unwell Infusion reactions--chest pain, shortness of breath or trouble breathing, feeling faint or lightheaded Kidney injury--decrease in the amount of urine, swelling of the ankles, hands, or feet Liver injury--right upper belly pain, loss of appetite, nausea, light-colored stool, dark yellow or brown urine, yellowing skin or eyes, unusual weakness or fatigue Redness, blistering, peeling, or loosening of the skin, including  inside the mouth Stomach pain that is severe, does not go away, or gets worse Tumor lysis syndrome (TLS)--nausea, vomiting, diarrhea, decrease in the amount of urine, dark urine, unusual weakness or fatigue, confusion, muscle pain or cramps, fast or irregular heartbeat, joint pain Side effects that usually do not require medical attention (report to your care team if they continue or are bothersome): Headache Joint pain Nausea Runny or stuffy nose Unusual weakness or fatigue This list may not describe all possible side effects. Call your doctor for medical advice about side effects. You may report side effects to FDA at 1-800-FDA-1088. Where should I keep my medication? This medication is given in a hospital or clinic. It will not be stored at home. NOTE: This sheet is a summary. It may not cover all possible information. If you have questions about this medicine, talk to your doctor, pharmacist, or health care provider.  2024 Elsevier/Gold Standard (2021-07-04 00:00:00)

## 2024-03-25 NOTE — Assessment & Plan Note (Signed)
 He takes oral b12 supplementation. Repeat B12 level has improved. Continue oral B12 1000mcg daily

## 2024-03-28 ENCOUNTER — Other Ambulatory Visit: Payer: Self-pay

## 2024-03-30 ENCOUNTER — Other Ambulatory Visit (INDEPENDENT_AMBULATORY_CARE_PROVIDER_SITE_OTHER): Payer: Self-pay | Admitting: Vascular Surgery

## 2024-03-30 DIAGNOSIS — I723 Aneurysm of iliac artery: Secondary | ICD-10-CM

## 2024-04-01 ENCOUNTER — Other Ambulatory Visit (INDEPENDENT_AMBULATORY_CARE_PROVIDER_SITE_OTHER): Payer: Medicare HMO

## 2024-04-01 ENCOUNTER — Ambulatory Visit (INDEPENDENT_AMBULATORY_CARE_PROVIDER_SITE_OTHER): Payer: Medicare HMO | Admitting: Vascular Surgery

## 2024-04-01 DIAGNOSIS — I723 Aneurysm of iliac artery: Secondary | ICD-10-CM

## 2024-04-12 ENCOUNTER — Ambulatory Visit (INDEPENDENT_AMBULATORY_CARE_PROVIDER_SITE_OTHER): Admitting: Vascular Surgery

## 2024-04-22 ENCOUNTER — Inpatient Hospital Stay

## 2024-04-22 ENCOUNTER — Inpatient Hospital Stay: Admitting: Oncology
